# Patient Record
Sex: Female | Born: 1935
Health system: Southern US, Community
[De-identification: ages and names within clinical notes are randomized; demographics above are authoritative.]

## PROBLEM LIST (undated history)

## (undated) DIAGNOSIS — K5730 Diverticulosis of large intestine without perforation or abscess without bleeding: Secondary | ICD-10-CM

## (undated) DIAGNOSIS — T7840XA Allergy, unspecified, initial encounter: Secondary | ICD-10-CM

## (undated) DIAGNOSIS — I639 Cerebral infarction, unspecified: Secondary | ICD-10-CM

## (undated) DIAGNOSIS — E785 Hyperlipidemia, unspecified: Secondary | ICD-10-CM

## (undated) DIAGNOSIS — C801 Malignant (primary) neoplasm, unspecified: Secondary | ICD-10-CM

## (undated) DIAGNOSIS — M8580 Other specified disorders of bone density and structure, unspecified site: Secondary | ICD-10-CM

## (undated) DIAGNOSIS — Z86010 Personal history of colonic polyps: Secondary | ICD-10-CM

## (undated) DIAGNOSIS — K5790 Diverticulosis of intestine, part unspecified, without perforation or abscess without bleeding: Secondary | ICD-10-CM

## (undated) DIAGNOSIS — Z860101 Personal history of adenomatous and serrated colon polyps: Secondary | ICD-10-CM

## (undated) DIAGNOSIS — I10 Essential (primary) hypertension: Secondary | ICD-10-CM

## (undated) DIAGNOSIS — E039 Hypothyroidism, unspecified: Secondary | ICD-10-CM

## (undated) DIAGNOSIS — N950 Postmenopausal bleeding: Secondary | ICD-10-CM

## (undated) DIAGNOSIS — R000 Tachycardia, unspecified: Secondary | ICD-10-CM

## (undated) DIAGNOSIS — K222 Esophageal obstruction: Secondary | ICD-10-CM

## (undated) HISTORY — DX: Allergy, unspecified, initial encounter: T78.40XA

## (undated) HISTORY — DX: Personal history of colonic polyps: Z86.010

## (undated) HISTORY — PX: CATARACT EXTRACTION: SUR2

## (undated) HISTORY — DX: Hypothyroidism, unspecified: E03.9

## (undated) HISTORY — DX: Esophageal obstruction: K22.2

## (undated) HISTORY — PX: DILATION AND CURETTAGE OF UTERUS: SHX78

## (undated) HISTORY — DX: Other specified disorders of bone density and structure, unspecified site: M85.80

## (undated) HISTORY — DX: Tachycardia, unspecified: R00.0

## (undated) HISTORY — DX: Essential (primary) hypertension: I10

## (undated) HISTORY — PX: NASAL SINUS SURGERY: SHX719

## (undated) HISTORY — PX: SHOULDER SURGERY: SHX246

## (undated) HISTORY — PX: HYSTEROSCOPY: SHX211

## (undated) HISTORY — DX: Hyperlipidemia, unspecified: E78.5

## (undated) HISTORY — DX: Personal history of adenomatous and serrated colon polyps: Z86.0101

## (undated) HISTORY — DX: Diverticulosis of large intestine without perforation or abscess without bleeding: K57.30

## (undated) HISTORY — DX: Postmenopausal bleeding: N95.0

## (undated) HISTORY — DX: Diverticulosis of intestine, part unspecified, without perforation or abscess without bleeding: K57.90

## (undated) HISTORY — DX: Malignant (primary) neoplasm, unspecified: C80.1

---

## 1957-07-04 HISTORY — PX: TUBAL LIGATION: SHX77

## 1998-02-24 ENCOUNTER — Other Ambulatory Visit: Admission: RE | Admit: 1998-02-24 | Discharge: 1998-02-24 | Payer: Self-pay | Admitting: Otolaryngology

## 1998-06-17 ENCOUNTER — Other Ambulatory Visit: Admission: RE | Admit: 1998-06-17 | Discharge: 1998-06-17 | Payer: Self-pay | Admitting: *Deleted

## 1999-08-02 ENCOUNTER — Other Ambulatory Visit: Admission: RE | Admit: 1999-08-02 | Discharge: 1999-08-02 | Payer: Self-pay | Admitting: *Deleted

## 1999-08-24 ENCOUNTER — Encounter: Payer: Self-pay | Admitting: *Deleted

## 1999-08-24 ENCOUNTER — Encounter: Admission: RE | Admit: 1999-08-24 | Discharge: 1999-08-24 | Payer: Self-pay | Admitting: *Deleted

## 2000-05-10 ENCOUNTER — Encounter: Payer: Self-pay | Admitting: Internal Medicine

## 2000-06-06 ENCOUNTER — Encounter: Payer: Self-pay | Admitting: Internal Medicine

## 2000-07-18 ENCOUNTER — Other Ambulatory Visit: Admission: RE | Admit: 2000-07-18 | Discharge: 2000-07-18 | Payer: Self-pay | Admitting: *Deleted

## 2000-08-25 ENCOUNTER — Encounter: Admission: RE | Admit: 2000-08-25 | Discharge: 2000-08-25 | Payer: Self-pay | Admitting: *Deleted

## 2000-08-25 ENCOUNTER — Encounter: Payer: Self-pay | Admitting: *Deleted

## 2001-09-11 ENCOUNTER — Other Ambulatory Visit: Admission: RE | Admit: 2001-09-11 | Discharge: 2001-09-11 | Payer: Self-pay | Admitting: *Deleted

## 2001-09-26 ENCOUNTER — Encounter: Admission: RE | Admit: 2001-09-26 | Discharge: 2001-09-26 | Payer: Self-pay | Admitting: *Deleted

## 2001-09-26 ENCOUNTER — Encounter: Payer: Self-pay | Admitting: *Deleted

## 2002-03-18 ENCOUNTER — Encounter: Admission: RE | Admit: 2002-03-18 | Discharge: 2002-03-18 | Payer: Self-pay | Admitting: Internal Medicine

## 2002-03-18 ENCOUNTER — Encounter: Payer: Self-pay | Admitting: Internal Medicine

## 2002-08-26 ENCOUNTER — Other Ambulatory Visit: Admission: RE | Admit: 2002-08-26 | Discharge: 2002-08-26 | Payer: Self-pay | Admitting: *Deleted

## 2002-10-08 ENCOUNTER — Encounter: Payer: Self-pay | Admitting: *Deleted

## 2002-10-08 ENCOUNTER — Encounter: Admission: RE | Admit: 2002-10-08 | Discharge: 2002-10-08 | Payer: Self-pay | Admitting: *Deleted

## 2003-10-15 ENCOUNTER — Other Ambulatory Visit: Admission: RE | Admit: 2003-10-15 | Discharge: 2003-10-15 | Payer: Self-pay | Admitting: *Deleted

## 2003-10-28 ENCOUNTER — Encounter: Admission: RE | Admit: 2003-10-28 | Discharge: 2003-10-28 | Payer: Self-pay | Admitting: *Deleted

## 2003-11-25 ENCOUNTER — Encounter: Payer: Self-pay | Admitting: Internal Medicine

## 2003-11-25 ENCOUNTER — Encounter: Payer: Self-pay | Admitting: Gastroenterology

## 2004-05-19 ENCOUNTER — Ambulatory Visit: Payer: Self-pay | Admitting: Internal Medicine

## 2004-07-21 ENCOUNTER — Ambulatory Visit: Payer: Self-pay | Admitting: Internal Medicine

## 2004-07-29 ENCOUNTER — Ambulatory Visit: Payer: Self-pay | Admitting: Internal Medicine

## 2004-10-25 ENCOUNTER — Other Ambulatory Visit: Admission: RE | Admit: 2004-10-25 | Discharge: 2004-10-25 | Payer: Self-pay | Admitting: *Deleted

## 2004-10-26 ENCOUNTER — Ambulatory Visit: Payer: Self-pay | Admitting: Internal Medicine

## 2004-11-15 ENCOUNTER — Encounter: Admission: RE | Admit: 2004-11-15 | Discharge: 2004-11-15 | Payer: Self-pay | Admitting: *Deleted

## 2005-01-19 ENCOUNTER — Ambulatory Visit: Payer: Self-pay | Admitting: Internal Medicine

## 2005-03-14 ENCOUNTER — Ambulatory Visit: Payer: Self-pay | Admitting: Internal Medicine

## 2005-04-07 ENCOUNTER — Ambulatory Visit: Payer: Self-pay | Admitting: Internal Medicine

## 2005-04-26 ENCOUNTER — Ambulatory Visit: Payer: Self-pay | Admitting: Internal Medicine

## 2005-05-03 ENCOUNTER — Ambulatory Visit: Payer: Self-pay | Admitting: Internal Medicine

## 2005-05-17 ENCOUNTER — Encounter: Payer: Self-pay | Admitting: Internal Medicine

## 2005-05-17 ENCOUNTER — Ambulatory Visit: Payer: Self-pay | Admitting: Family Medicine

## 2005-09-23 ENCOUNTER — Ambulatory Visit: Payer: Self-pay | Admitting: Internal Medicine

## 2005-10-12 ENCOUNTER — Ambulatory Visit: Payer: Self-pay | Admitting: Internal Medicine

## 2005-11-22 ENCOUNTER — Encounter: Admission: RE | Admit: 2005-11-22 | Discharge: 2005-11-22 | Payer: Self-pay | Admitting: *Deleted

## 2006-03-30 ENCOUNTER — Ambulatory Visit: Payer: Self-pay | Admitting: Internal Medicine

## 2006-04-06 ENCOUNTER — Ambulatory Visit: Payer: Self-pay | Admitting: Internal Medicine

## 2006-04-27 ENCOUNTER — Ambulatory Visit: Payer: Self-pay | Admitting: Internal Medicine

## 2006-08-04 LAB — CONVERTED CEMR LAB: Pap Smear: NORMAL

## 2006-09-01 ENCOUNTER — Ambulatory Visit: Payer: Self-pay | Admitting: Internal Medicine

## 2006-10-04 ENCOUNTER — Ambulatory Visit: Payer: Self-pay | Admitting: Internal Medicine

## 2006-10-04 LAB — CONVERTED CEMR LAB
ALT: 25 units/L (ref 0–40)
AST: 27 units/L (ref 0–37)
Albumin: 3.7 g/dL (ref 3.5–5.2)
Alkaline Phosphatase: 74 units/L (ref 39–117)
BUN: 11 mg/dL (ref 6–23)
Bilirubin, Direct: 0.1 mg/dL (ref 0.0–0.3)
CO2: 30 meq/L (ref 19–32)
Calcium: 9.1 mg/dL (ref 8.4–10.5)
Chloride: 109 meq/L (ref 96–112)
Cholesterol: 152 mg/dL (ref 0–200)
Creatinine, Ser: 0.7 mg/dL (ref 0.4–1.2)
GFR calc Af Amer: 103 mL/min
GFR calc non Af Amer: 85 mL/min
Glucose, Bld: 91 mg/dL (ref 70–99)
HDL: 55.5 mg/dL (ref >39.0)
LDL Cholesterol: 83 mg/dL (ref 0–99)
Potassium: 4.5 meq/L (ref 3.5–5.1)
Sodium: 145 meq/L (ref 135–145)
TSH: 0.49 microintl units/mL (ref 0.35–5.50)
Total Bilirubin: 0.7 mg/dL (ref 0.3–1.2)
Total CHOL/HDL Ratio: 2.7
Total Protein: 6.6 g/dL (ref 6.0–8.3)
Triglycerides: 68 mg/dL (ref 0–149)
VLDL: 14 mg/dL (ref 0–40)

## 2006-11-24 ENCOUNTER — Encounter: Admission: RE | Admit: 2006-11-24 | Discharge: 2006-11-24 | Payer: Self-pay | Admitting: *Deleted

## 2006-11-30 ENCOUNTER — Encounter: Admission: RE | Admit: 2006-11-30 | Discharge: 2006-11-30 | Payer: Self-pay | Admitting: *Deleted

## 2006-12-11 ENCOUNTER — Other Ambulatory Visit: Admission: RE | Admit: 2006-12-11 | Discharge: 2006-12-11 | Payer: Self-pay | Admitting: *Deleted

## 2007-01-16 DIAGNOSIS — I10 Essential (primary) hypertension: Secondary | ICD-10-CM | POA: Insufficient documentation

## 2007-01-16 DIAGNOSIS — E039 Hypothyroidism, unspecified: Secondary | ICD-10-CM | POA: Insufficient documentation

## 2007-01-16 DIAGNOSIS — J45909 Unspecified asthma, uncomplicated: Secondary | ICD-10-CM | POA: Insufficient documentation

## 2007-01-16 DIAGNOSIS — Z85828 Personal history of other malignant neoplasm of skin: Secondary | ICD-10-CM

## 2007-02-16 ENCOUNTER — Ambulatory Visit: Payer: Self-pay | Admitting: Internal Medicine

## 2007-02-21 ENCOUNTER — Ambulatory Visit: Payer: Self-pay | Admitting: Internal Medicine

## 2007-02-22 ENCOUNTER — Ambulatory Visit: Payer: Self-pay | Admitting: Internal Medicine

## 2007-03-12 ENCOUNTER — Ambulatory Visit: Payer: Self-pay | Admitting: Internal Medicine

## 2007-03-29 ENCOUNTER — Encounter: Payer: Self-pay | Admitting: Internal Medicine

## 2007-04-05 ENCOUNTER — Ambulatory Visit: Payer: Self-pay | Admitting: Pulmonary Disease

## 2007-04-05 ENCOUNTER — Encounter: Payer: Self-pay | Admitting: Internal Medicine

## 2007-04-11 ENCOUNTER — Ambulatory Visit: Payer: Self-pay | Admitting: Internal Medicine

## 2007-04-11 DIAGNOSIS — E785 Hyperlipidemia, unspecified: Secondary | ICD-10-CM

## 2007-04-12 LAB — CONVERTED CEMR LAB
ALT: 22 units/L (ref 0–35)
AST: 27 units/L (ref 0–37)
Albumin: 4 g/dL (ref 3.5–5.2)
Alkaline Phosphatase: 64 units/L (ref 39–117)
BUN: 9 mg/dL (ref 6–23)
Bilirubin, Direct: 0.2 mg/dL (ref 0.0–0.3)
CO2: 32 meq/L (ref 19–32)
Calcium: 9.7 mg/dL (ref 8.4–10.5)
Chloride: 111 meq/L (ref 96–112)
Cholesterol: 171 mg/dL (ref 0–200)
Creatinine, Ser: 0.8 mg/dL (ref 0.4–1.2)
GFR calc Af Amer: 88 mL/min
GFR calc non Af Amer: 73 mL/min
Glucose, Bld: 98 mg/dL (ref 70–99)
HDL: 54.5 mg/dL (ref >39.0)
LDL Cholesterol: 89 mg/dL (ref 0–99)
Potassium: 4.9 meq/L (ref 3.5–5.1)
Sodium: 148 meq/L — ABNORMAL HIGH (ref 135–145)
TSH: 0.9 microintl units/mL (ref 0.35–5.50)
Total Bilirubin: 0.7 mg/dL (ref 0.3–1.2)
Total CHOL/HDL Ratio: 3.1
Total Protein: 6.6 g/dL (ref 6.0–8.3)
Triglycerides: 139 mg/dL (ref 0–149)
VLDL: 28 mg/dL (ref 0–40)

## 2007-04-23 ENCOUNTER — Ambulatory Visit: Payer: Self-pay | Admitting: Internal Medicine

## 2007-04-23 DIAGNOSIS — I447 Left bundle-branch block, unspecified: Secondary | ICD-10-CM | POA: Insufficient documentation

## 2007-04-26 ENCOUNTER — Encounter: Payer: Self-pay | Admitting: Internal Medicine

## 2007-04-26 ENCOUNTER — Ambulatory Visit: Payer: Self-pay

## 2007-05-16 ENCOUNTER — Encounter: Payer: Self-pay | Admitting: Internal Medicine

## 2007-07-10 ENCOUNTER — Ambulatory Visit: Payer: Self-pay | Admitting: Internal Medicine

## 2007-10-09 ENCOUNTER — Ambulatory Visit: Payer: Self-pay | Admitting: Internal Medicine

## 2007-10-10 LAB — CONVERTED CEMR LAB
ALT: 22 units/L (ref 0–35)
Bilirubin, Direct: 0.2 mg/dL (ref 0.0–0.3)
CO2: 30 meq/L (ref 19–32)
Calcium: 9.2 mg/dL (ref 8.4–10.5)
GFR calc Af Amer: 88 mL/min
GFR calc non Af Amer: 73 mL/min
HDL: 49.2 mg/dL (ref >39.0)
LDL Cholesterol: 73 mg/dL (ref 0–99)
Sodium: 144 meq/L (ref 135–145)
Total Bilirubin: 0.6 mg/dL (ref 0.3–1.2)
Total CHOL/HDL Ratio: 2.9

## 2007-12-14 ENCOUNTER — Other Ambulatory Visit: Admission: RE | Admit: 2007-12-14 | Discharge: 2007-12-14 | Payer: Self-pay | Admitting: Obstetrics and Gynecology

## 2007-12-17 ENCOUNTER — Telehealth (INDEPENDENT_AMBULATORY_CARE_PROVIDER_SITE_OTHER): Payer: Self-pay | Admitting: *Deleted

## 2007-12-24 ENCOUNTER — Encounter: Payer: Self-pay | Admitting: Internal Medicine

## 2008-01-09 ENCOUNTER — Ambulatory Visit: Payer: Self-pay | Admitting: Internal Medicine

## 2008-01-11 ENCOUNTER — Ambulatory Visit: Payer: Self-pay | Admitting: Gastroenterology

## 2008-01-11 DIAGNOSIS — Z860100 Personal history of colon polyps, unspecified: Secondary | ICD-10-CM | POA: Insufficient documentation

## 2008-01-11 DIAGNOSIS — K5730 Diverticulosis of large intestine without perforation or abscess without bleeding: Secondary | ICD-10-CM

## 2008-01-11 HISTORY — DX: Diverticulosis of large intestine without perforation or abscess without bleeding: K57.30

## 2008-01-11 HISTORY — DX: Personal history of colonic polyps: Z86.010

## 2008-01-11 HISTORY — DX: Personal history of colon polyps, unspecified: Z86.0100

## 2008-01-11 LAB — CONVERTED CEMR LAB
Eosinophils Relative: 2 % (ref 0.0–5.0)
HCT: 45.1 % (ref 36.0–46.0)
Hemoglobin: 15.3 g/dL — ABNORMAL HIGH (ref 12.0–15.0)
Lymphocytes Relative: 35.8 % (ref 12.0–46.0)
Monocytes Absolute: 0.9 10*3/uL (ref 0.1–1.0)
Monocytes Relative: 10.6 % (ref 3.0–12.0)
Neutro Abs: 4.1 10*3/uL (ref 1.4–7.7)
RBC: 5.05 M/uL (ref 3.87–5.11)
TSH: 0.8 microintl units/mL (ref 0.35–5.50)
WBC: 8.2 10*3/uL (ref 4.5–10.5)

## 2008-01-14 ENCOUNTER — Encounter: Payer: Self-pay | Admitting: Gastroenterology

## 2008-01-14 ENCOUNTER — Ambulatory Visit: Payer: Self-pay | Admitting: Gastroenterology

## 2008-01-16 ENCOUNTER — Encounter: Payer: Self-pay | Admitting: Gastroenterology

## 2008-01-28 ENCOUNTER — Ambulatory Visit: Payer: Self-pay | Admitting: Internal Medicine

## 2008-02-01 ENCOUNTER — Ambulatory Visit: Payer: Self-pay | Admitting: Gastroenterology

## 2008-02-01 LAB — CONVERTED CEMR LAB
Fecal Occult Blood: NEGATIVE
OCCULT 4: NEGATIVE
OCCULT 5: NEGATIVE

## 2008-02-04 ENCOUNTER — Encounter: Payer: Self-pay | Admitting: Gastroenterology

## 2008-02-25 ENCOUNTER — Ambulatory Visit: Payer: Self-pay | Admitting: Internal Medicine

## 2008-02-25 ENCOUNTER — Telehealth: Payer: Self-pay | Admitting: Internal Medicine

## 2008-02-27 ENCOUNTER — Telehealth: Payer: Self-pay | Admitting: Internal Medicine

## 2008-03-28 ENCOUNTER — Ambulatory Visit: Payer: Self-pay | Admitting: Internal Medicine

## 2008-03-28 ENCOUNTER — Telehealth: Payer: Self-pay | Admitting: Internal Medicine

## 2008-06-05 ENCOUNTER — Ambulatory Visit: Payer: Self-pay | Admitting: Internal Medicine

## 2008-06-05 LAB — CONVERTED CEMR LAB
ALT: 21 units/L (ref 0–35)
AST: 20 units/L (ref 0–37)
Bilirubin, Direct: 0.1 mg/dL (ref 0.0–0.3)
CO2: 30 meq/L (ref 19–32)
Calcium: 9.1 mg/dL (ref 8.4–10.5)
Chloride: 109 meq/L (ref 96–112)
Creatinine, Ser: 0.8 mg/dL (ref 0.4–1.2)
Glucose, Bld: 93 mg/dL (ref 70–99)
HDL: 48.2 mg/dL (ref >39.0)
Sodium: 144 meq/L (ref 135–145)
Total Bilirubin: 0.8 mg/dL (ref 0.3–1.2)
Total Protein: 6.6 g/dL (ref 6.0–8.3)
Triglycerides: 123 mg/dL (ref 0–149)

## 2008-06-18 ENCOUNTER — Ambulatory Visit: Payer: Self-pay | Admitting: Internal Medicine

## 2008-07-10 ENCOUNTER — Ambulatory Visit: Payer: Self-pay | Admitting: Internal Medicine

## 2008-07-21 ENCOUNTER — Telehealth: Payer: Self-pay | Admitting: Internal Medicine

## 2008-08-26 ENCOUNTER — Telehealth: Payer: Self-pay | Admitting: Internal Medicine

## 2008-08-27 ENCOUNTER — Encounter: Payer: Self-pay | Admitting: Internal Medicine

## 2008-09-17 ENCOUNTER — Ambulatory Visit: Payer: Self-pay | Admitting: Internal Medicine

## 2008-11-17 ENCOUNTER — Telehealth (INDEPENDENT_AMBULATORY_CARE_PROVIDER_SITE_OTHER): Payer: Self-pay | Admitting: *Deleted

## 2008-12-04 ENCOUNTER — Encounter: Payer: Self-pay | Admitting: Internal Medicine

## 2008-12-15 ENCOUNTER — Other Ambulatory Visit: Admission: RE | Admit: 2008-12-15 | Discharge: 2008-12-15 | Payer: Self-pay | Admitting: Obstetrics and Gynecology

## 2008-12-15 ENCOUNTER — Encounter: Payer: Self-pay | Admitting: Obstetrics and Gynecology

## 2008-12-15 ENCOUNTER — Ambulatory Visit: Payer: Self-pay | Admitting: Obstetrics and Gynecology

## 2009-01-23 ENCOUNTER — Ambulatory Visit: Payer: Self-pay | Admitting: Internal Medicine

## 2009-01-23 LAB — CONVERTED CEMR LAB
Alkaline Phosphatase: 72 units/L (ref 39–117)
BUN: 9 mg/dL (ref 6–23)
Bilirubin, Direct: 0 mg/dL (ref 0.0–0.3)
CO2: 28 meq/L (ref 19–32)
Chloride: 107 meq/L (ref 96–112)
Cholesterol: 141 mg/dL (ref 0–200)
Creatinine, Ser: 0.8 mg/dL (ref 0.4–1.2)
Glucose, Bld: 100 mg/dL — ABNORMAL HIGH (ref 70–99)
LDL Cholesterol: 68 mg/dL (ref 0–99)
Potassium: 4.2 meq/L (ref 3.5–5.1)
Total Bilirubin: 0.8 mg/dL (ref 0.3–1.2)
Total Protein: 6.8 g/dL (ref 6.0–8.3)
VLDL: 29 mg/dL (ref 0.0–40.0)

## 2009-02-09 ENCOUNTER — Ambulatory Visit: Payer: Self-pay | Admitting: Internal Medicine

## 2009-04-03 ENCOUNTER — Ambulatory Visit: Payer: Self-pay | Admitting: Internal Medicine

## 2009-05-15 ENCOUNTER — Telehealth: Payer: Self-pay | Admitting: Internal Medicine

## 2009-05-22 ENCOUNTER — Ambulatory Visit: Payer: Self-pay | Admitting: Family Medicine

## 2009-08-10 ENCOUNTER — Ambulatory Visit: Payer: Self-pay | Admitting: Internal Medicine

## 2009-08-10 LAB — CONVERTED CEMR LAB
ALT: 20 units/L (ref 0–35)
Bilirubin, Direct: 0.1 mg/dL (ref 0.0–0.3)
CO2: 30 meq/L (ref 19–32)
Calcium: 9 mg/dL (ref 8.4–10.5)
Glucose, Bld: 86 mg/dL (ref 70–99)
HDL: 53 mg/dL (ref >39.00)
LDL Cholesterol: 58 mg/dL (ref 0–99)
Potassium: 4 meq/L (ref 3.5–5.1)
Sodium: 141 meq/L (ref 135–145)
TSH: 0.81 microintl units/mL (ref 0.35–5.50)
Total Bilirubin: 0.5 mg/dL (ref 0.3–1.2)
Total CHOL/HDL Ratio: 3
VLDL: 26.4 mg/dL (ref 0.0–40.0)

## 2009-08-28 ENCOUNTER — Ambulatory Visit: Payer: Self-pay | Admitting: Internal Medicine

## 2009-11-19 ENCOUNTER — Telehealth: Payer: Self-pay | Admitting: Internal Medicine

## 2009-11-27 ENCOUNTER — Ambulatory Visit: Payer: Self-pay | Admitting: Internal Medicine

## 2009-11-27 DIAGNOSIS — J309 Allergic rhinitis, unspecified: Secondary | ICD-10-CM | POA: Insufficient documentation

## 2009-12-07 ENCOUNTER — Encounter: Payer: Self-pay | Admitting: Internal Medicine

## 2009-12-24 ENCOUNTER — Ambulatory Visit: Payer: Self-pay | Admitting: Internal Medicine

## 2010-03-17 ENCOUNTER — Ambulatory Visit: Payer: Self-pay | Admitting: Internal Medicine

## 2010-03-17 LAB — CONVERTED CEMR LAB
Albumin: 4.1 g/dL (ref 3.5–5.2)
Bilirubin, Direct: 0.1 mg/dL (ref 0.0–0.3)
CO2: 28 meq/L (ref 19–32)
Calcium: 9.5 mg/dL (ref 8.4–10.5)
Creatinine, Ser: 0.7 mg/dL (ref 0.4–1.2)
GFR calc non Af Amer: 80.54 mL/min (ref >60)
Glucose, Bld: 80 mg/dL (ref 70–99)
HDL: 52 mg/dL (ref >39.00)
Total CHOL/HDL Ratio: 3
Total Protein: 6.4 g/dL (ref 6.0–8.3)
Triglycerides: 91 mg/dL (ref 0.0–149.0)
VLDL: 18.2 mg/dL (ref 0.0–40.0)

## 2010-03-19 ENCOUNTER — Ambulatory Visit: Payer: Self-pay | Admitting: Obstetrics and Gynecology

## 2010-04-02 ENCOUNTER — Ambulatory Visit: Payer: Self-pay | Admitting: Internal Medicine

## 2010-07-25 ENCOUNTER — Encounter: Payer: Self-pay | Admitting: *Deleted

## 2010-08-05 NOTE — Progress Notes (Signed)
  Phone Note Call from Patient   Caller: Patient Call For: Robin Sons MD Summary of Call: Needs a partial refill for Metoprolol 50 mg. one by mouth daily. Nicolette Bang ([place]) Initial call taken by: Lynann Beaver CMA,  Nov 19, 2009 4:45 PM    Prescriptions: METOPROLOL TARTRATE 50 MG TABS (METOPROLOL TARTRATE) Take 1 tablet by mouth two times a day  #10 x 0   Entered by:   Lynann Beaver CMA   Authorized by:   Robin Sons MD   Signed by:   Lynann Beaver CMA on 11/19/2009   Method used:   Electronically to        [facility]  [phone]* (retail)       [address]       [place], [place]  [address]       Ph: [phone] or [phone]       Fax: [phone]   RxID:   [id number]

## 2010-08-05 NOTE — Assessment & Plan Note (Signed)
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  History of Present Illness: Rhinits---much imporved  hypothyroid---tolerating meds  HTn---tolerating meds without difficulty  All other systems reviewed and were negative   Preventive Screening-Counseling & Management  Alcohol-Tobacco     Smoking Status: quit  Current Problems (verified): 1)  Rhinitis  (ICD-477.9) 2)  Diverticulosis of Colon  (ICD-562.10) 3)  Personal History of Colonic Polyps  (ICD-V12.72) 4)  Left Bundle Branch Block  (ICD-426.3) 5)  Hyperlipidemia  (ICD-272.4) 6)  Hypothyroidism  (ICD-244.9) 7)  Hypertension  (ICD-401.9) 8)  Skin Cancer, Hx of  (ICD-V10.83) 9)  Asthma  (ICD-493.90)  Current Medications (verified): 1)  Crestor 20 Mg Tabs (Rosuvastatin Calcium) .... 1/2 Every Other Day 2)  Metoprolol Tartrate 50 Mg Tabs (Metoprolol Tartrate) .... Take 1 Tablet By Mouth Two Times A Day 3)  Levothyroxine Sodium 100 Mcg  Tabs (Levothyroxine Sodium) .Marland Kitchen.. 1 By Mouth Daily 4)  Adult Aspirin Low Strength 81 Mg  Tbdp (Aspirin) .... Three Times Weekly 5)  Multivitamins   Tabs (Multiple Vitamin) .... Qd 6)  Tums 500 Mg  Chew (Calcium Carbonate Antacid) .... 3 Qd 7)  Cvs Saline Nasal Spray 0.65 %  Soln (Saline) .... Once Daily 8)  Triamcinolone Acetonide 0.1 %  Crea (Triamcinolone Acetonide) .... Use As Directed 9)  Lisinopril 20 Mg Tabs (Lisinopril) .Marland Kitchen.. 1 Tablet By Mouth Daily 10)  Aciphex 20 Mg Tbec  (Rabeprazole Sodium) .... Take 1 Tab Every Morning 11)  Glucosamine Sulfate 500 Mg Tabs (Glucosamine Sulfate) .... Take Two Times A Day 12)  Vitamin D 1000 Unit Tabs (Cholecalciferol) .... Once Daily 13)  Mucinex Dm 30-600 Mg Xr12h-Tab (Dextromethorphan-Guaifenesin) .... Take 1 Tablet By Mouth Two Times A Day 14)  Fluticasone Propionate 50 Mcg/act  Susp (Fluticasone Propionate) .... 2 Sprays Each Nostril Once Daily 15)  Fluorouracil 5 % Crea (Fluorouracil) .... Use Twice Daily As Directed  Allergies: 1)  ! * Novacaine  Physical Exam  General:  alert and well-developed.   Head:  normocephalic and atraumatic.   Eyes:  pupils equal and pupils round.   Ears:  R ear normal and L ear normal.   Neck:  No deformities, masses, or tenderness noted. Lungs:  Normal respiratory effort, chest expands symmetrically. Lungs are clear to auscultation, no crackles or wheezes. Heart:  normal rate and regular rhythm.   Abdomen:  Bowel sounds positive,abdomen soft and non-tender without masses, organomegaly or hernias noted.  overweight Neurologic:  cranial nerves II-XII intact and gait normal.     Impression & Recommendations:  Problem # 1:  RHINITIS (ICD-477.9) much improved Her updated medication list for this problem includes:    Cvs Saline Nasal Spray 0.65 % Soln (Saline) ..... Once daily    Fluticasone Propionate 50 Mcg/act Susp (Fluticasone  propionate) .Marland Kitchen... 2 sprays each nostril once daily  Problem # 2:  HYPOTHYROIDISM (ICD-244.9) contolled in February Her updated medication list for this problem includes:    Levothyroxine Sodium 100 Mcg Tabs (Levothyroxine sodium) .Marland Kitchen... 1 by mouth daily  Labs Reviewed: TSH: 0.81 (08/10/2009)    Chol: 137 (08/10/2009)   HDL: 53.00 (08/10/2009)   LDL: 58 (08/10/2009)   TG: 132.0 (08/10/2009)  Problem # 3:  HYPERLIPIDEMIA (ICD-272.4) controlled previously continue current medications  Her updated medication list for this problem includes:    Crestor 20  Mg Tabs (Rosuvastatin calcium) .Marland Kitchen... 1/2 every other day  Labs Reviewed: SGOT: 24 (08/10/2009)   SGPT: 20 (08/10/2009)   HDL:53.00 (08/10/2009), 44.20 (01/23/2009)  LDL:58 (08/10/2009), 68 (01/23/2009)  Chol:137 (08/10/2009), 141 (01/23/2009)  Trig:132.0 (08/10/2009), 145.0 (01/23/2009)  Complete Medication List: 1)  Crestor 20 Mg Tabs (Rosuvastatin calcium) .... 1/2 every other day 2)  Metoprolol Tartrate 50 Mg Tabs (Metoprolol tartrate) .... Take 1 tablet by mouth two times a day 3)  Levothyroxine Sodium 100 Mcg Tabs (Levothyroxine sodium) .Marland Kitchen.. 1 by mouth daily 4)  Adult Aspirin Low Strength 81 Mg Tbdp (Aspirin) .... Three times weekly 5)  Multivitamins Tabs (Multiple vitamin) .... Qd 6)  Tums 500 Mg Chew (Calcium carbonate antacid) .... 3 qd 7)  Cvs Saline Nasal Spray 0.65 % Soln (Saline) .... Once daily 8)  Triamcinolone Acetonide 0.1 % Crea (Triamcinolone acetonide) .... Use as directed 9)  Lisinopril 20 Mg Tabs (Lisinopril) .Marland Kitchen.. 1 tablet by mouth daily 10)  Aciphex 20 Mg Tbec (Rabeprazole sodium) .... Take 1 tab every morning 11)  Glucosamine Sulfate 500 Mg Tabs (Glucosamine sulfate) .... Take two times a day 12)  Vitamin D 1000 Unit Tabs (Cholecalciferol) .... Once daily 13)  Mucinex Dm 30-600 Mg Xr12h-tab (Dextromethorphan-guaifenesin) .... Take 1 tablet by mouth two times a day 14)  Fluticasone Propionate 50 Mcg/act Susp (Fluticasone propionate) .... 2 sprays each nostril once daily 15)  Fluorouracil 5 % Crea (Fluorouracil) .... Use twice daily as directed Prescriptions: LEVOTHYROXINE SODIUM 100 MCG  TABS (LEVOTHYROXINE SODIUM) 1 by mouth daily  #90 x 3   Entered and Authorized by:   Birdie Sons MD   Signed by:   Birdie Sons MD on 12/24/2009   Method used:   Print then Give to Patient   RxID:   [id number]

## 2010-08-05 NOTE — Miscellaneous (Signed)
Summary: flu vaccine   Clinical Lists Changes  Orders: Added new Service order of Flu Vaccine 22yrs + MEDICARE PATIENTS (W4132) - Signed Added new Service order of Administration Flu vaccine - MCR (G4010) - Signed Observations: Added new observation of FLU VAX VIS: 01/26/2010 version (03/17/2010 9:09) Added new observation of FLU VAXLOT: [id number] (03/17/2010 9:09) Added new observation of FLU VAXMFR: [employer] (03/17/2010 9:09) Added new observation of FLU VAX EXP: 01/01/2011 (03/17/2010 9:09) Added new observation of FLU VAX DSE: 0.64ml (03/17/2010 9:09) Added new observation of FLU VAX: Fluvax 3+ (03/17/2010 9:09)Flu Vaccine Consent Questions     Do you have a history of severe allergic reactions to this vaccine? no    Any prior history of allergic reactions to egg and/or gelatin? no    Do you have a sensitivity to the preservative Thimersol? no    Do you have a past history of Guillan-Barre Syndrome? no    Do you currently have an acute febrile illness? no    Have you ever had a severe reaction to latex? no    Vaccine information given and explained to patient? yes    Are you currently pregnant? no    Lot Number:[id number]   Exp Date:01/01/2011   Site Given  Left Deltoid IMbservation of FLU VAX EXP: 01/01/2011 (03/17/2010 9:09) Added new observation of FLU VAX DSE: 0.29ml (03/17/2010 9:09) Added new observation of FLU VAX: Fluvax 3+ (03/17/2010 9:09)    .lbmedflu

## 2010-08-05 NOTE — Assessment & Plan Note (Signed)
Summary: UP/MHF/PT RSC/CJR   Vital Signs:  Patient profile:   77 year old female Weight:      172 pounds Temp:     98.7 degrees F oral BP sitting:   148 / 80  (left arm) Cuff size:   regular  Vitals Entered By: Kern Reap CMA Duncan Dull) (August 28, 2009 9:44 AM)  Serial Vital Signs/Assessments:  Time      Position  BP       Pulse  Resp  Temp     By                     138/75                         Birdie Sons MD  CC: 6 month follow up Is Patient Diabetic? No Pain Assessment Patient in pain? no        Primary Care Provider:  Birdie Sons MD  CC:  6 month follow up.  History of Present Illness:  Follow-Up Visit      This is an 77 year old woman who presents for Follow-up visit.  The patient denies chest pain and palpitations.  Since the last visit the patient notes no new problems or concerns and being seen by a specialist.  The patient reports taking meds as prescribed.  When questioned about possible medication side effects, the patient notes none.   has had some squamous cell skin surgeries---dr. Wakisha Filbert  All other systems reviewed and were negative   Current Problems (verified): 1)  Diverticulosis of Colon  (ICD-562.10) 2)  Personal History of Colonic Polyps  (ICD-V12.72) 3)  Left Bundle Branch Block  (ICD-426.3) 4)  Hyperlipidemia  (ICD-272.4) 5)  Hypothyroidism  (ICD-244.9) 6)  Hypertension  (ICD-401.9) 7)  Skin Cancer, Hx of  (ICD-V10.83) 8)  Asthma  (ICD-493.90)  Current Medications (verified): 1)  Crestor 20 Mg Tabs (Rosuvastatin Calcium) .... 1/2 Every Other Day 2)  Metoprolol Tartrate 50 Mg Tabs (Metoprolol Tartrate) .... Take 1 Tablet By Mouth Two Times A Day 3)  Levothyroxine Sodium 100 Mcg  Tabs (Levothyroxine Sodium) .Marland Kitchen.. 1 By Mouth Daily 4)  Adult Aspirin Low Strength 81 Mg  Tbdp (Aspirin) .... Three Times Weekly 5)  Multivitamins   Tabs (Multiple Vitamin) .... Qd 6)  Tums 500 Mg  Chew (Calcium Carbonate Antacid) .... 3 Qd 7)  Cvs  Saline Nasal Spray 0.65 %  Soln (Saline) .... Once Daily 8)  Triamcinolone Acetonide 0.1 %  Crea (Triamcinolone Acetonide) .... Use As Directed 9)  Lisinopril 20 Mg Tabs (Lisinopril) .... 1/2 Tablet By Mouth Daily 10)  Aciphex 20 Mg Tbec (Rabeprazole Sodium) .... Take 1 Tab Every Morning 11)  Glucosamine Sulfate 500 Mg Tabs (Glucosamine Sulfate) .... Take Two Times A Day 12)  Vitamin D 1000 Unit Tabs (Cholecalciferol) .... Once Daily  Allergies (verified): 1)  ! * Novacaine  Past History:  Past Medical History: Last updated: 01/10/2008 Allergic rhinitis Asthma Skin cancer, hx of-Rt shoulder Hypertension Hypothyroidism Hyperlipidemia Adenomatous Colon Polyps Diverticulosis Esophageal Stricture Hemorrhoids  Past Surgical History: Last updated: 01/10/2008 Cataract extraction Sinus surgery Shoulder sx Colonoscopy-11/25/2003 Tubal Ligation- 1959  Family History: Last updated: 01/10/2008 Family History of Heart Disease: Brother Bone cancer: Mother  Social History: Last updated: 04/11/2007 Former Smoker Regular exercise-no Retired  Risk Factors: Exercise: no (04/11/2007)  Risk Factors: Smoking Status: quit (01/16/2007)  Review of Systems       All other systems  reviewed and were negative   Physical Exam  General:  Well-developed,well-nourished,in no acute distress; alert,appropriate and cooperative throughout examination Head:  normocephalic and atraumatic.   Eyes:  pupils equal and pupils round.   Ears:  R ear normal and L ear normal.   Neck:  No deformities, masses, or tenderness noted. Chest Wall:  No deformities, masses, or tenderness noted. Lungs:  Normal respiratory effort, chest expands symmetrically. Lungs are clear to auscultation, no crackles or wheezes. Heart:  Normal rate and regular rhythm. S1 and S2 normal without gallop, murmur, click, rub or other extra sounds. Abdomen:  Bowel sounds positive,abdomen soft and non-tender without masses,  organomegaly or hernias noted.  overweight Msk:  No deformity or scoliosis noted of thoracic or lumbar spine.   Neurologic:  cranial nerves II-XII intact and gait normal.   Skin:  turgor normal and color normal.   Psych:  memory intact for recent and remote and good eye contact.     Impression & Recommendations:  Problem # 1:  HYPERLIPIDEMIA (ICD-272.4) controlled continue current medications  Her updated medication list for this problem includes:    Crestor 20 Mg Tabs (Rosuvastatin calcium) .Marland Kitchen... 1/2 every other day  Labs Reviewed: SGOT: 24 (08/10/2009)   SGPT: 20 (08/10/2009)   HDL:53.00 (08/10/2009), 44.20 (01/23/2009)  LDL:58 (08/10/2009), 68 (01/23/2009)  Chol:137 (08/10/2009), 141 (01/23/2009)  Trig:132.0 (08/10/2009), 145.0 (01/23/2009)  Problem # 2:  HYPOTHYROIDISM (ICD-244.9) controlled continue current medications  Her updated medication list for this problem includes:    Levothyroxine Sodium 100 Mcg Tabs (Levothyroxine sodium) .Marland Kitchen... 1 by mouth daily  Labs Reviewed: TSH: 0.81 (08/10/2009)    Chol: 137 (08/10/2009)   HDL: 53.00 (08/10/2009)   LDL: 58 (08/10/2009)   TG: 132.0 (08/10/2009)  Problem # 3:  HYPERTENSION (ICD-401.9) increase lisinopril Her updated medication list for this problem includes:    Metoprolol Tartrate 50 Mg Tabs (Metoprolol tartrate) .Marland Kitchen... Take 1 tablet by mouth two times a day    Lisinopril 20 Mg Tabs (Lisinopril) .Marland Kitchen... 1 tablet by mouth daily  BP today: 148/80 Prior BP: 164/108 (05/22/2009)  Labs Reviewed: K+: 4.0 (08/10/2009) Creat: : 0.8 (08/10/2009)   Chol: 137 (08/10/2009)   HDL: 53.00 (08/10/2009)   LDL: 58 (08/10/2009)   TG: 132.0 (08/10/2009)  Problem # 4:  SKIN CANCER, HX OF (ICD-V10.83) ---followed by dermatology  Complete Medication List: 1)  Crestor 20 Mg Tabs (Rosuvastatin calcium) .... 1/2 every other day 2)  Metoprolol Tartrate 50 Mg Tabs (Metoprolol tartrate) .... Take 1 tablet by mouth two times a day 3)  Levothyroxine  Sodium 100 Mcg Tabs (Levothyroxine sodium) .Marland Kitchen.. 1 by mouth daily 4)  Adult Aspirin Low Strength 81 Mg Tbdp (Aspirin) .... Three times weekly 5)  Multivitamins Tabs (Multiple vitamin) .... Qd 6)  Tums 500 Mg Chew (Calcium carbonate antacid) .... 3 qd 7)  Cvs Saline Nasal Spray 0.65 % Soln (Saline) .... Once daily 8)  Triamcinolone Acetonide 0.1 % Crea (Triamcinolone acetonide) .... Use as directed 9)  Lisinopril 20 Mg Tabs (Lisinopril) .Marland Kitchen.. 1 tablet by mouth daily 10)  Aciphex 20 Mg Tbec (Rabeprazole sodium) .... Take 1 tab every morning 11)  Glucosamine Sulfate 500 Mg Tabs (Glucosamine sulfate) .... Take two times a day 12)  Vitamin D 1000 Unit Tabs (Cholecalciferol) .... Once daily  Patient Instructions: 1)  Please schedule a follow-up appointment in 6 months. 2)  lipids 272.4 3)  liver 995.2 4)   bmet--995.2 Prescriptions: LISINOPRIL 20 MG TABS (LISINOPRIL) 1 tablet by mouth  daily  #90 x 3   Entered and Authorized by:   Birdie Sons MD   Signed by:   Birdie Sons MD on 08/28/2009   Method used:   Electronically to        [facility]  [phone]* (retail)       [address]       [place], [place]  [address]       Ph: [phone] or [phone]       Fax: [phone]   RxID:   [id number]

## 2010-08-05 NOTE — Assessment & Plan Note (Signed)
Summary: sinuses?//ccm   Vital Signs:  Patient profile:   77 year old female Weight:      171 pounds BMI:     32.43 Temp:     98.6 degrees F oral Pulse rate:   68 / minute Pulse rhythm:   regular Resp:     12 per minute BP sitting:   156 / 90  (left arm) Cuff size:   regular  Vitals Entered By: Gladis Riffle, RN (Nov 27, 2009 10:38 AM) CC: c/o sinus drainage and cough, also vertigo--getting better but continues Is Patient Diabetic? No Comments not sure about how she is taking lisinopril, has written 1/2 on old check out sheet--told to do one once daily as written   Primary Care Provider:  Birdie Sons MD  CC:  c/o sinus drainage and cough and also vertigo--getting better but continues.  History of Present Illness: has had long term sinus congestion for 3 months---improving but not gone (reason for visit today) no cough occasionally has vertigo no fever or chills  with above sxs she does have some fatigue  All other systems reviewed and were negative   Preventive Screening-Counseling & Management  Alcohol-Tobacco     Smoking Status: quit  Current Problems (verified): 1)  Diverticulosis of Colon  (ICD-562.10) 2)  Personal History of Colonic Polyps  (ICD-V12.72) 3)  Left Bundle Branch Block  (ICD-426.3) 4)  Hyperlipidemia  (ICD-272.4) 5)  Hypothyroidism  (ICD-244.9) 6)  Hypertension  (ICD-401.9) 7)  Skin Cancer, Hx of  (ICD-V10.83) 8)  Asthma  (ICD-493.90)  Current Medications (verified): 1)  Crestor 20 Mg Tabs (Rosuvastatin Calcium) .... 1/2 Every Other Day 2)  Metoprolol Tartrate 50 Mg Tabs (Metoprolol Tartrate) .... Take 1 Tablet By Mouth Two Times A Day 3)  Levothyroxine Sodium 100 Mcg  Tabs (Levothyroxine Sodium) .Marland Kitchen.. 1 By Mouth Daily 4)  Adult Aspirin Low Strength 81 Mg  Tbdp (Aspirin) .... Three Times Weekly 5)  Multivitamins   Tabs (Multiple Vitamin) .... Qd 6)  Tums 500 Mg  Chew (Calcium Carbonate Antacid) .... 3 Qd 7)  Cvs Saline Nasal Spray 0.65 %  Soln  (Saline) .... Once Daily 8)  Triamcinolone Acetonide 0.1 %  Crea (Triamcinolone Acetonide) .... Use As Directed 9)  Lisinopril 20 Mg Tabs (Lisinopril) .Marland Kitchen.. 1 Tablet By Mouth Daily 10)  Aciphex 20 Mg Tbec (Rabeprazole Sodium) .... Take 1 Tab Every Morning 11)  Glucosamine Sulfate 500 Mg Tabs (Glucosamine Sulfate) .... Take Two Times A Day 12)  Vitamin D 1000 Unit Tabs (Cholecalciferol) .... Once Daily  Allergies: 1)  ! * Novacaine  Past History:  Past Medical History: Last updated: 01/10/2008 Allergic rhinitis Asthma Skin cancer, hx of-Rt shoulder Hypertension Hypothyroidism Hyperlipidemia Adenomatous Colon Polyps Diverticulosis Esophageal Stricture Hemorrhoids  Past Surgical History: Last updated: 01/10/2008 Cataract extraction Sinus surgery Shoulder sx Colonoscopy-11/25/2003 Tubal Ligation- 1959  Family History: Last updated: 01/10/2008 Family History of Heart Disease: Brother Bone cancer: Mother  Social History: Last updated: 04/11/2007 Former Smoker Regular exercise-no Retired  Risk Factors: Exercise: no (04/11/2007)  Risk Factors: Smoking Status: quit (11/27/2009)  Physical Exam  General:  alert and well-developed.   Head:  normocephalic and atraumatic.   Eyes:  pupils equal and pupils round.   Nose:  no external deformity and no external erythema.   Neck:  No deformities, masses, or tenderness noted. Chest Wall:  No deformities, masses, or tenderness noted. Lungs:  Normal respiratory effort, chest expands symmetrically. Lungs are clear to auscultation, no crackles or wheezes. Heart:  Normal rate and regular rhythm. S1 and S2 normal without gallop, murmur, click, rub or other extra sounds.   Impression & Recommendations:  Problem # 1:  RHINITIS (ICD-477.9) doubt infectious trial mucinex dm and fluticasone  side effects discussed Her updated medication list for this problem includes:    Cvs Saline Nasal Spray 0.65 % Soln (Saline) ..... Once  daily    Fluticasone Propionate 50 Mcg/act Susp (Fluticasone propionate) .Marland Kitchen... 2 sprays each nostril once daily  Complete Medication List: 1)  Crestor 20 Mg Tabs (Rosuvastatin calcium) .... 1/2 every other day 2)  Metoprolol Tartrate 50 Mg Tabs (Metoprolol tartrate) .... Take 1 tablet by mouth two times a day 3)  Levothyroxine Sodium 100 Mcg Tabs (Levothyroxine sodium) .Marland Kitchen.. 1 by mouth daily 4)  Adult Aspirin Low Strength 81 Mg Tbdp (Aspirin) .... Three times weekly 5)  Multivitamins Tabs (Multiple vitamin) .... Qd 6)  Tums 500 Mg Chew (Calcium carbonate antacid) .... 3 qd 7)  Cvs Saline Nasal Spray 0.65 % Soln (Saline) .... Once daily 8)  Triamcinolone Acetonide 0.1 % Crea (Triamcinolone acetonide) .... Use as directed 9)  Lisinopril 20 Mg Tabs (Lisinopril) .Marland Kitchen.. 1 tablet by mouth daily 10)  Aciphex 20 Mg Tbec (Rabeprazole sodium) .... Take 1 tab every morning 11)  Glucosamine Sulfate 500 Mg Tabs (Glucosamine sulfate) .... Take two times a day 12)  Vitamin D 1000 Unit Tabs (Cholecalciferol) .... Once daily 13)  Mucinex Dm 30-600 Mg Xr12h-tab (Dextromethorphan-guaifenesin) .... Take 1 tablet by mouth two times a day 14)  Fluticasone Propionate 50 Mcg/act Susp (Fluticasone propionate) .... 2 sprays each nostril once daily  Patient Instructions: 1)  Please schedule a follow-up appointment in 1 month. Prescriptions: FLUTICASONE PROPIONATE 50 MCG/ACT  SUSP (FLUTICASONE PROPIONATE) 2 sprays each nostril once daily  #1 vial x 3   Entered and Authorized by:   Birdie Sons MD   Signed by:   Birdie Sons MD on 11/27/2009   Method used:   Electronically to        [facility]  [phone]* (retail)       [address]       [place], [place]  [address]       Ph: [phone] or [phone]       Fax: [phone]   RxID:   [id number]

## 2010-08-05 NOTE — Assessment & Plan Note (Signed)
Summary: 6 month rov/njr/pt rescd//ccm---PT RSC (BMP) // RS   Vital Signs:  Patient profile:   77 year old female Weight:      168 pounds Temp:     97.8 degrees F oral BP sitting:   130 / 72  (left arm) Cuff size:   regular  Vitals Entered By: Sid Falcon LPN (April 02, 2010 9:36 AM)  Primary Care Provider:  Birdie Sons MD   History of Present Illness:  Follow-Up Visit      This is an 77 year old woman who presents for Follow-up visit.  The patient denies chest pain and palpitations.  Since the last visit the patient notes no new problems or concerns--has some sinus congestion---minimal. Minimal vertigo (improving).  The patient reports taking meds as prescribed.  When questioned about possible medication side effects, the patient notes   All other systems reviewed and were negative   Current Problems (verified): 1)  Rhinitis  (ICD-477.9) 2)  Diverticulosis of Colon  (ICD-562.10) 3)  Personal History of Colonic Polyps  (ICD-V12.72) 4)  Left Bundle Branch Block  (ICD-426.3) 5)  Hyperlipidemia  (ICD-272.4) 6)  Hypothyroidism  (ICD-244.9) 7)  Hypertension  (ICD-401.9) 8)  Skin Cancer, Hx of  (ICD-V10.83) 9)  Asthma  (ICD-493.90)  Current Medications (verified): 1)  Crestor 20 Mg Tabs (Rosuvastatin Calcium) .... 1/2 Every Other Day 2)  Metoprolol Tartrate 50 Mg Tabs (Metoprolol Tartrate) .... Take 1 Tablet By Mouth Two Times A Day 3)  Levothyroxine Sodium 100 Mcg  Tabs (Levothyroxine Sodium) .Marland Kitchen.. 1 By Mouth Daily 4)  Adult Aspirin Low Strength 81 Mg  Tbdp (Aspirin) .... Three Times Weekly 5)  Multivitamins   Tabs (Multiple Vitamin) .... Qd 6)  Tums 500 Mg  Chew (Calcium Carbonate Antacid) .... 3 Qd 7)  Triamcinolone Acetonide 0.1 %  Crea (Triamcinolone Acetonide) .... Use As Directed 8)  Lisinopril 20 Mg Tabs (Lisinopril) .Marland Kitchen.. 1 Tablet By Mouth Daily 9)  Aciphex 20 Mg Tbec (Rabeprazole Sodium) .... Take 1 Tab Every Morning 10)  Glucosamine Sulfate 500 Mg Tabs  (Glucosamine Sulfate) .... Take Two Times A Day 11)  Vitamin D 1000 Unit Tabs (Cholecalciferol) .... Once Daily 12)  Fluticasone Propionate 50 Mcg/act  Susp (Fluticasone Propionate) .... 2 Sprays Each Nostril Once Daily 13)  Fluorouracil 5 % Crea (Fluorouracil) .... Use Twice Daily As Directed  Allergies: 1)  ! * Novacaine  Past History:  Past Medical History: Last updated: 01/10/2008 Allergic rhinitis Asthma Skin cancer, hx of-Rt shoulder Hypertension Hypothyroidism Hyperlipidemia Adenomatous Colon Polyps Diverticulosis Esophageal Stricture Hemorrhoids  Past Surgical History: Last updated: 01/10/2008 Cataract extraction Sinus surgery Shoulder sx Colonoscopy-11/25/2003 Tubal Ligation- 1959  Family History: Last updated: 01/10/2008 Family History of Heart Disease: Brother Bone cancer: Mother  Social History: Last updated: 04/11/2007 Former Smoker Regular exercise-no Retired  Risk Factors: Exercise: no (04/11/2007)  Risk Factors: Smoking Status: quit (12/24/2009)  Physical Exam  General:  alert and well-developed.   Head:  normocephalic and atraumatic.   Eyes:  pupils equal and pupils round.   Neck:  No deformities, masses, or tenderness noted. Chest Wall:  No deformities, masses, or tenderness noted. Lungs:  Normal respiratory effort, chest expands symmetrically. Lungs are clear to auscultation, no crackles or wheezes. Heart:  normal rate and regular rhythm.   Abdomen:  soft and non-tender.   Skin:  turgor normal and color normal.   Psych:  good eye contact and not anxious appearing.     Impression & Recommendations:  Problem #  1:  RHINITIS (ICD-477.9) Assessment Unchanged better continue current medications  not worth trying additional meds Her updated medication list for this problem includes:    Fluticasone Propionate 50 Mcg/act Susp (Fluticasone propionate) .Marland Kitchen... 2 sprays each nostril once daily  Problem # 2:  HYPERLIPIDEMIA  (ICD-272.4) controlled continue current medications  Her updated medication list for this problem includes:    Crestor 20 Mg Tabs (Rosuvastatin calcium) .Marland Kitchen... 1/2 every other day  Labs Reviewed: SGOT: 23 (03/17/2010)   SGPT: 19 (03/17/2010)   HDL:52.00 (03/17/2010), 53.00 (08/10/2009)  LDL:84 (03/17/2010), 58 (08/10/2009)  Chol:154 (03/17/2010), 137 (08/10/2009)  Trig:91.0 (03/17/2010), 132.0 (08/10/2009)  Problem # 3:  HYPOTHYROIDISM (ICD-244.9) controlled continue current medications  Her updated medication list for this problem includes:    Levothyroxine Sodium 100 Mcg Tabs (Levothyroxine sodium) .Marland Kitchen... 1 by mouth daily  Labs Reviewed: TSH: 0.81 (08/10/2009)    Chol: 154 (03/17/2010)   HDL: 52.00 (03/17/2010)   LDL: 84 (03/17/2010)   TG: 91.0 (03/17/2010)  Complete Medication List: 1)  Crestor 20 Mg Tabs (Rosuvastatin calcium) .... 1/2 every other day 2)  Metoprolol Tartrate 50 Mg Tabs (Metoprolol tartrate) .... Take 1 tablet by mouth two times a day 3)  Levothyroxine Sodium 100 Mcg Tabs (Levothyroxine sodium) .Marland Kitchen.. 1 by mouth daily 4)  Adult Aspirin Low Strength 81 Mg Tbdp (Aspirin) .... Three times weekly 5)  Multivitamins Tabs (Multiple vitamin) .... Qd 6)  Tums 500 Mg Chew (Calcium carbonate antacid) .... 3 qd 7)  Triamcinolone Acetonide 0.1 % Crea (Triamcinolone acetonide) .... Use as directed 8)  Lisinopril 20 Mg Tabs (Lisinopril) .Marland Kitchen.. 1 tablet by mouth daily 9)  Aciphex 20 Mg Tbec (Rabeprazole sodium) .... Take 1 tab every morning 10)  Glucosamine Sulfate 500 Mg Tabs (Glucosamine sulfate) .... Take two times a day 11)  Vitamin D 1000 Unit Tabs (Cholecalciferol) .... Once daily 12)  Fluticasone Propionate 50 Mcg/act Susp (Fluticasone propionate) .... 2 sprays each nostril once daily 13)  Fluorouracil 5 % Crea (Fluorouracil) .... Use twice daily as directed  Patient Instructions: 1)  Please schedule a follow-up appointment in 6 months. 2)  lipids 272.4 3)  liver 995.2 4)   bmet 5)  cbc diff 6)  tsh-244.9

## 2010-09-02 ENCOUNTER — Encounter: Payer: Self-pay | Admitting: Internal Medicine

## 2010-09-03 ENCOUNTER — Ambulatory Visit (INDEPENDENT_AMBULATORY_CARE_PROVIDER_SITE_OTHER): Payer: Medicare Other | Admitting: Internal Medicine

## 2010-09-03 ENCOUNTER — Encounter: Payer: Self-pay | Admitting: Internal Medicine

## 2010-09-03 VITALS — BP 120/78 | HR 64 | Temp 98.7°F | Wt 158.0 lb

## 2010-09-03 DIAGNOSIS — M542 Cervicalgia: Secondary | ICD-10-CM

## 2010-09-03 DIAGNOSIS — I10 Essential (primary) hypertension: Secondary | ICD-10-CM

## 2010-09-03 MED ORDER — FLUTICASONE PROPIONATE 50 MCG/ACT NA SUSP: 2.0000 | Freq: Every day | NASAL | Status: DC

## 2010-09-03 NOTE — Progress Notes (Signed)
  Subjective:    Patient ID: Robin Ferguson, female    DOB: Aug 22, 1922, 77 y.o.   MRN: [id number]  HPI Comes in to discuss bp meds---she has had difficulty cutting lisinopril in 1/2.  In addition has had intermittent neck pain radiating to left arm to elbow. This is not exertional. No weakness, no SOB or any other associated sxs.  Past Medical History  Diagnosis Date  . Allergy     rhinitis  . Asthma   . Cancer     skin- right shoulder  . Hypertension   . Hypothyroidism   . Hyperlipidemia   . Hx of adenomatous colonic polyps   . Diverticulosis   . Esophageal stricture   . Hemorrhoids    Past Surgical History  Procedure Date  . Cataract extraction   . Nasal sinus surgery   . Shoulder surgery   . Tubal ligation 1959    reports that she has quit smoking. She does not have any smokeless tobacco history on file. Her alcohol and drug histories not on file. family history includes Cancer in her mother and Heart disease in her brother.    Allergies  Allergen Reactions  . Procaine Hcl     REACTION: heart palpitationbs     Review of Systems  patient denies chest pain, shortness of breath, orthopnea. Denies lower extremity edema, abdominal pain, change in appetite, change in bowel movements. Patient denies rashes, musculoskeletal complaints. No other specific complaints in a complete review of systems.      Objective:   Physical Exam  Well-developed well-nourished female in no acute distress. HEENT exam atraumatic, normocephalic, extraocular muscles are intact. Neck is supple. No jugular venous distention no thyromegaly. Chest clear to auscultation without increased work of breathing. Cardiac exam S1 and S2 are regular. Abdominal exam active bowel sounds, soft, nontender. Extremities no edema. Neurologic exam she is alert without any motor sensory deficits. Gait is normal.        Assessment & Plan:  Presumed radicular pain (cervical)---no rx at this time

## 2010-09-03 NOTE — Assessment & Plan Note (Signed)
Controlled Pt instructed on how to cut meds in 1/2---demonstrated in office

## 2010-09-07 ENCOUNTER — Telehealth: Payer: Self-pay | Admitting: Internal Medicine

## 2010-09-07 DIAGNOSIS — E039 Hypothyroidism, unspecified: Secondary | ICD-10-CM

## 2010-09-07 MED ORDER — LEVOTHYROXINE SODIUM 100 MCG PO TABS: 100.0000 ug | ORAL_TABLET | Freq: Every day | ORAL | Status: DC

## 2010-09-07 NOTE — Telephone Encounter (Signed)
rx sent in electronically 

## 2010-09-07 NOTE — Telephone Encounter (Signed)
°  Pt c/b to adv that she only needs 2 wks worth of synthroid med sent to [facility], 14 pills to do her till her mail order rx arrives.

## 2010-09-07 NOTE — Telephone Encounter (Signed)
Pt needs a refill on med: synthroid 0.1mg ..... [facility] [address].

## 2010-10-05 ENCOUNTER — Other Ambulatory Visit (INDEPENDENT_AMBULATORY_CARE_PROVIDER_SITE_OTHER): Payer: Medicare Other | Admitting: Internal Medicine

## 2010-10-05 DIAGNOSIS — E039 Hypothyroidism, unspecified: Secondary | ICD-10-CM

## 2010-10-05 DIAGNOSIS — E785 Hyperlipidemia, unspecified: Secondary | ICD-10-CM

## 2010-10-05 LAB — CBC WITH DIFFERENTIAL/PLATELET
Basophils Relative: 1 % (ref 0.0–3.0)
Eosinophils Absolute: 0.2 10*3/uL (ref 0.0–0.7)
HCT: 42.9 % (ref 36.0–46.0)
Hemoglobin: 14.5 g/dL (ref 12.0–15.0)
Lymphocytes Relative: 32.2 % (ref 12.0–46.0)
MCHC: 33.8 g/dL (ref 30.0–36.0)
MCV: 90.5 fl (ref 78.0–100.0)
Neutro Abs: 3 10*3/uL (ref 1.4–7.7)
RBC: 4.74 Mil/uL (ref 3.87–5.11)

## 2010-10-06 LAB — BASIC METABOLIC PANEL
CO2: 29 mEq/L (ref 19–32)
Calcium: 9.2 mg/dL (ref 8.4–10.5)
Chloride: 104 mEq/L (ref 96–112)
Potassium: 4.5 mEq/L (ref 3.5–5.1)
Sodium: 143 mEq/L (ref 135–145)

## 2010-10-06 LAB — LIPID PANEL
HDL: 49.7 mg/dL (ref >39.00)
LDL Cholesterol: 91 mg/dL (ref 0–99)
Total CHOL/HDL Ratio: 3
Triglycerides: 132 mg/dL (ref 0.0–149.0)
VLDL: 26.4 mg/dL (ref 0.0–40.0)

## 2010-10-06 LAB — HEPATIC FUNCTION PANEL: Albumin: 4 g/dL (ref 3.5–5.2)

## 2010-10-12 ENCOUNTER — Ambulatory Visit (INDEPENDENT_AMBULATORY_CARE_PROVIDER_SITE_OTHER): Payer: Medicare Other | Admitting: Internal Medicine

## 2010-10-12 ENCOUNTER — Encounter: Payer: Self-pay | Admitting: Internal Medicine

## 2010-10-12 DIAGNOSIS — E039 Hypothyroidism, unspecified: Secondary | ICD-10-CM

## 2010-10-12 DIAGNOSIS — I10 Essential (primary) hypertension: Secondary | ICD-10-CM

## 2010-10-12 DIAGNOSIS — E785 Hyperlipidemia, unspecified: Secondary | ICD-10-CM

## 2010-10-12 DIAGNOSIS — Z85828 Personal history of other malignant neoplasm of skin: Secondary | ICD-10-CM

## 2010-10-12 NOTE — Assessment & Plan Note (Signed)
No longer taking  the Efudex.

## 2010-10-12 NOTE — Assessment & Plan Note (Signed)
Reasonable control. Continue current medications. 

## 2010-10-12 NOTE — Progress Notes (Signed)
  Subjective:    Patient ID: Robin Ferguson, female    DOB: May 12, 1923, 77 y.o.   MRN: [id number]  HPI  Pt here for f/u htn---tolerating meds without difficulty---no home BPs Hypothyroid---tolerating replacement Lipids---on crestor qod  Past Medical History  Diagnosis Date  . Allergy     rhinitis  . Asthma   . Cancer     skin- right shoulder  . Hypertension   . Hypothyroidism   . Hyperlipidemia   . Hx of adenomatous colonic polyps   . Diverticulosis   . Esophageal stricture   . Hemorrhoids    Past Surgical History  Procedure Date  . Cataract extraction   . Nasal sinus surgery   . Shoulder surgery   . Tubal ligation 1959    reports that she has quit smoking. She does not have any smokeless tobacco history on file. Her alcohol and drug histories not on file. family history includes Cancer in her mother and Heart disease in her brother. Allergies  Allergen Reactions  . Procaine Hcl     REACTION: heart palpitationbs      Review of Systems  patient denies chest pain, shortness of breath, orthopnea. Denies lower extremity edema, abdominal pain, change in appetite, change in bowel movements. Patient denies rashes, musculoskeletal complaints. No other specific complaints in a complete review of systems. She does admit to some fatigue and tiredness---she relates to recent travel to [facility] Current outpatient prescriptions:aspirin 81 MG tablet, Take 81 mg by mouth. Take three times weekly , Disp: , Rfl: ;  calcium carbonate (TUMS) 500 MG chewable tablet, Chew 3 tablets by mouth daily.  , Disp: , Rfl: ;  cholecalciferol (VITAMIN D) 1000 UNITS tablet, Take 1,000 Units by mouth daily.  , Disp: , Rfl: ;  fluticasone (FLONASE) 50 MCG/ACT nasal spray, 2 sprays by Nasal route daily., Disp: 16 g, Rfl: 11 glucosamine-chondroitin 500-400 MG tablet, Take 1 tablet by mouth 2 (two) times daily.  , Disp: , Rfl: ;  levothyroxine (SYNTHROID, LEVOTHROID) 100 MCG tablet, Take 1 tablet (100 mcg total) by  mouth daily., Disp: 14 tablet, Rfl: 0;  lisinopril (PRINIVIL,ZESTRIL) 20 MG tablet, Take 20 mg by mouth daily.  , Disp: , Rfl: ;  metoprolol (LOPRESSOR) 50 MG tablet, Take 50 mg by mouth 2 (two) times daily.  , Disp: , Rfl:  multivitamin (THERAGRAN) per tablet, Take 1 tablet by mouth daily.  , Disp: , Rfl: ;  RABEprazole (ACIPHEX) 20 MG tablet, Take 20 mg by mouth daily.  , Disp: , Rfl: ;  rosuvastatin (CRESTOR) 20 MG tablet, Take 20 mg by mouth every other day. Take 1/2 tablet , Disp: , Rfl: ;  triamcinolone (KENALOG) 0.1 % cream, Apply topically as directed.  , Disp: , Rfl:  DISCONTD: fluorouracil (EFUDEX) 5 % cream, Apply topically 2 (two) times daily.  , Disp: , Rfl:      Objective:   Physical Exam  Well-developed well-nourished female in no acute distress. HEENT exam atraumatic, normocephalic, extraocular muscles are intact. Neck is supple. No jugular venous distention no thyromegaly. Chest clear to auscultation without increased work of breathing. Cardiac exam S1 and S2 are regular. Abdominal exam active bowel sounds, soft, nontender. Extremities no edema.  Gait is normal. Several echymoses on legs and arms       Assessment & Plan:

## 2010-10-12 NOTE — Assessment & Plan Note (Signed)
Reviewed recent lab results with the patient. Continue current dose of Crestor. She only takes it every other day.

## 2010-10-12 NOTE — Assessment & Plan Note (Signed)
At goal. Continue current replacement therapy.

## 2010-10-15 ENCOUNTER — Other Ambulatory Visit: Payer: Self-pay | Admitting: *Deleted

## 2010-10-15 DIAGNOSIS — E785 Hyperlipidemia, unspecified: Secondary | ICD-10-CM

## 2010-10-15 MED ORDER — ROSUVASTATIN CALCIUM 20 MG PO TABS: 20.0000 mg | ORAL_TABLET | ORAL | Status: DC

## 2010-11-08 ENCOUNTER — Ambulatory Visit (INDEPENDENT_AMBULATORY_CARE_PROVIDER_SITE_OTHER): Payer: Medicare Other | Admitting: Internal Medicine

## 2010-11-08 ENCOUNTER — Encounter: Payer: Self-pay | Admitting: Internal Medicine

## 2010-11-08 DIAGNOSIS — R21 Rash and other nonspecific skin eruption: Secondary | ICD-10-CM | POA: Insufficient documentation

## 2010-11-08 MED ORDER — METHYLPREDNISOLONE (PAK) 4 MG PO TABS: ORAL_TABLET | ORAL | Status: AC

## 2010-11-08 NOTE — Assessment & Plan Note (Signed)
Most suggestive of contact dermatitis with poison ivy. Begin Medrol Dosepak. Followup if no improvement or worsening.

## 2010-11-08 NOTE — Progress Notes (Signed)
  Subjective:    Patient ID: Robin Ferguson, female    DOB: 06-20-1923, 77 y.o.   MRN: [id number]  HPIPt presents to clinic for evaluation of rash. Was working in the yard last week that approximately 2 days later developed diffuse erythematous raised rash along forehead arms and upper chest. Has minimal itching associated. No spread of the rash. Rash is not painful nor is it in a nondermatomal distribution. Continue medication for the problem. No alleviating or exacerbating factors. No other complaints.  Reviewed past medical history, medications and allergies.  Review of Systems  Constitutional: Negative for fever and chills.  Skin: Positive for color change and rash. Negative for wound.  Neurological: Negative for numbness.       Objective:   Physical Exam  [nursing notereviewed. Constitutional: She appears well-developed and well-nourished. No distress.  HENT:  Head: Normocephalic and atraumatic.  Right Ear: External ear normal.  Left Ear: External ear normal.  Nose: Nose normal.  Mouth/Throat: Oropharynx is clear and moist.  Eyes: Conjunctivae are normal. No scleral icterus.  Neurological: She is alert.  Skin: Skin is warm and dry. Rash noted. She is not diaphoretic. There is erythema.       Diffuse erythematous papular rash located primarily forehead and bilateral lower arms. No obvious secondary infection  Psychiatric: She has a normal mood and affect.          Assessment & Plan:

## 2010-11-16 ENCOUNTER — Ambulatory Visit (INDEPENDENT_AMBULATORY_CARE_PROVIDER_SITE_OTHER): Payer: Medicare Other | Admitting: Internal Medicine

## 2010-11-16 ENCOUNTER — Encounter: Payer: Self-pay | Admitting: Internal Medicine

## 2010-11-16 VITALS — BP 130/70 | HR 66

## 2010-11-16 DIAGNOSIS — R21 Rash and other nonspecific skin eruption: Secondary | ICD-10-CM

## 2010-11-16 MED ORDER — METHYLPREDNISOLONE ACETATE 80 MG/ML IJ SUSP
80.0000 mg | Freq: Once | INTRAMUSCULAR | Status: AC
  Administered 2010-11-16: 80 mg via INTRAMUSCULAR

## 2010-11-16 NOTE — Progress Notes (Signed)
  Subjective:    Patient ID: Robin Ferguson, female    DOB: [birth year], 77 y.o.   MRN: [id number]  HPI Pt presents to clinic for re-evaluation of rash. Seen 5/7 with rash felt to be poison ivy involving face/forehead and bilateral lower arms. Recently completed medrol dosepak and continues to use triamcinolone bid. Face involvement has almost entirely resolved. Rash persists on bilateral lower arms without signficant improvement. C/o itching but denies pain.  No new areas of rash or spread. No exacerbating or alleviating factors. No other complaints.  Reviewed pmh, medications and allergies.    Review of Systems  Constitutional: Negative for fever and chills.  Skin: Positive for rash. Negative for color change, pallor and wound.  Neurological: Negative for numbness.       Objective:   Physical Exam  [nursing notereviewed. Constitutional: She appears well-developed and well-nourished. No distress.  HENT:  Head: Normocephalic and atraumatic.  Right Ear: External ear normal.  Nose: Nose normal.  Eyes: Conjunctivae are normal. No scleral icterus.  Neurological: She is alert.  Skin: Skin is warm and dry. Rash noted. She is not diaphoretic. There is erythema. No pallor.       Face:rash resolved Bilateral lower arms: stable erythematous vesicular rash persists. No dermatomal pattern. No obvious secondary infection.  Psychiatric: She has a normal mood and affect.          Assessment & Plan:

## 2010-11-16 NOTE — Assessment & Plan Note (Signed)
Improved but persistent. Continue triamcinolone to arms bid. Depomedrol 80mg  IM today.

## 2010-11-19 ENCOUNTER — Ambulatory Visit (INDEPENDENT_AMBULATORY_CARE_PROVIDER_SITE_OTHER): Payer: Medicare Other | Admitting: Internal Medicine

## 2010-11-19 ENCOUNTER — Encounter: Payer: Self-pay | Admitting: Internal Medicine

## 2010-11-19 DIAGNOSIS — R21 Rash and other nonspecific skin eruption: Secondary | ICD-10-CM

## 2010-11-19 MED ORDER — METHYLPREDNISOLONE (PAK) 4 MG PO TABS: ORAL_TABLET | ORAL | Status: AC

## 2010-11-21 ENCOUNTER — Encounter: Payer: Self-pay | Admitting: Internal Medicine

## 2010-11-21 NOTE — Assessment & Plan Note (Signed)
Improved. Recommend continue triamcinolone use for now. Given printed prescription for Medrol Dosepak and if patient is still concerned and not improving enough after the next 3-4 days may repeat Medrol Dosepak. Reviewed pathophysiology of contact dermatitis and overall average clinical course.

## 2010-11-21 NOTE — Progress Notes (Signed)
  Subjective:    Patient ID: Robin Ferguson, female    DOB: [birth year], 77 y.o.   MRN: [id number]  HPI patient presents to clinic as a walk-in for evaluation of rash. Has been seen twice most recently for what is felt to be poison ivy. Initially involved the face and arms and was treated with Medrol Dosepak and triamcinolone. Face involvement resolved. Last visit received Depo-Medrol injection IM and arms have improved but not resolved. Remains itchy. There is no dermatomal distribution of pain and the areas are not spreading. No other alleviating or exacerbating factors. No other complaints.  Reviewed past medical history, medications and allergies    Review of Systems see history of present illness     Objective:   Physical Exam  Nursing note and vitals reviewed. Constitutional: She appears well-developed and well-nourished. No distress.  HENT:  Head: Normocephalic.  Eyes: Conjunctivae are normal.  Neurological: She is alert.  Skin: Skin is warm and dry. Rash noted. She is not diaphoretic. There is erythema. No pallor.       Facial involvement resolved. Bilateral ventral arms significantly improved. Still mild involvement of arms and there is a focal area of confluent erythematous blisters left ventral arm. No evidence of secondary bacterial infection  Psychiatric: She has a normal mood and affect.          Assessment & Plan:

## 2010-12-06 ENCOUNTER — Other Ambulatory Visit: Payer: Self-pay | Admitting: *Deleted

## 2010-12-06 DIAGNOSIS — I10 Essential (primary) hypertension: Secondary | ICD-10-CM

## 2010-12-06 DIAGNOSIS — E785 Hyperlipidemia, unspecified: Secondary | ICD-10-CM

## 2010-12-06 DIAGNOSIS — K219 Gastro-esophageal reflux disease without esophagitis: Secondary | ICD-10-CM

## 2010-12-06 DIAGNOSIS — E039 Hypothyroidism, unspecified: Secondary | ICD-10-CM

## 2010-12-06 MED ORDER — ROSUVASTATIN CALCIUM 20 MG PO TABS: 20.0000 mg | ORAL_TABLET | ORAL | Status: DC

## 2010-12-06 MED ORDER — LISINOPRIL 20 MG PO TABS: 20.0000 mg | ORAL_TABLET | Freq: Every day | ORAL | Status: DC

## 2010-12-06 MED ORDER — RABEPRAZOLE SODIUM 20 MG PO TBEC: 20.0000 mg | DELAYED_RELEASE_TABLET | Freq: Every day | ORAL | Status: DC

## 2010-12-06 MED ORDER — METOPROLOL TARTRATE 50 MG PO TABS: 50.0000 mg | ORAL_TABLET | Freq: Two times a day (BID) | ORAL | Status: DC

## 2010-12-06 MED ORDER — LEVOTHYROXINE SODIUM 100 MCG PO TABS: 100.0000 ug | ORAL_TABLET | Freq: Every day | ORAL | Status: DC

## 2010-12-07 ENCOUNTER — Telehealth: Payer: Self-pay | Admitting: *Deleted

## 2010-12-07 NOTE — Telephone Encounter (Signed)
Pt came to desk and ask if her prescriptions were ready-She was asked if she was dr swords pt and she said yes.  SHe states she brought them yesterday andneeded them written out.  I told her that it usually takes 24 to48 hours for refills.  SHe then asked for his nurse and I told hershe was working with another md and wh en I got a chance I would ask her about it.I asked her if anyone had called her to tell her they were ready and she said no. I told her we would call her when they were ready. /   I finally got time and talked with his nurse and she said her scripts were up front,. So I called pt and told her they were ready.  She walked thru the lobby to im pod and came to my desk and wants to know why she had to come back for her  Scripts and said I had been rude to her.  I told here I was not rude to her and that she should wait until someone calls her so sh e wouldn't make an unnecessary trip.

## 2010-12-24 ENCOUNTER — Other Ambulatory Visit: Payer: Self-pay | Admitting: *Deleted

## 2010-12-24 DIAGNOSIS — E785 Hyperlipidemia, unspecified: Secondary | ICD-10-CM

## 2010-12-24 MED ORDER — ROSUVASTATIN CALCIUM 20 MG PO TABS: 20.0000 mg | ORAL_TABLET | ORAL | Status: DC

## 2010-12-24 MED ORDER — ROSUVASTATIN CALCIUM 20 MG PO TABS: 30.0000 mg | ORAL_TABLET | ORAL | Status: DC

## 2010-12-30 ENCOUNTER — Encounter: Payer: Self-pay | Admitting: Internal Medicine

## 2011-03-28 ENCOUNTER — Telehealth: Payer: Self-pay | Admitting: Internal Medicine

## 2011-03-28 DIAGNOSIS — N950 Postmenopausal bleeding: Secondary | ICD-10-CM | POA: Insufficient documentation

## 2011-03-28 DIAGNOSIS — M8580 Other specified disorders of bone density and structure, unspecified site: Secondary | ICD-10-CM | POA: Insufficient documentation

## 2011-03-28 NOTE — Telephone Encounter (Signed)
Walk in-----pt is completely out of her Metoprolol 50mg  bid. She will need a 2 weeks supply sent to [facility] today and a mail order sent to [facility]--- [phone]

## 2011-03-28 NOTE — Telephone Encounter (Addendum)
rx sent in electronically 

## 2011-03-29 ENCOUNTER — Other Ambulatory Visit: Payer: Self-pay | Admitting: Internal Medicine

## 2011-03-29 ENCOUNTER — Telehealth: Payer: Self-pay | Admitting: Internal Medicine

## 2011-03-29 NOTE — Telephone Encounter (Signed)
rx sent in electronically, pt aware 

## 2011-03-29 NOTE — Telephone Encounter (Signed)
Pt called yesterday at 3:30 about refill on Metoprolol 50mg . Pt came into office today and wanted to know why prescription had not been filled . Pt was extremely upset that her prescription has not been called in yet. I explained that it could take up to 72 hours for refill. Please call into [facility] [address]. Pt said she will be back in if her RX is not called in.

## 2011-04-06 ENCOUNTER — Encounter: Payer: Self-pay | Admitting: Obstetrics and Gynecology

## 2011-04-06 ENCOUNTER — Other Ambulatory Visit (HOSPITAL_COMMUNITY)
Admission: RE | Admit: 2011-04-06 | Discharge: 2011-04-06 | Disposition: A | Discharge status: Home / Self Care | Payer: Medicare Other | Source: Ambulatory Visit | Attending: Obstetrics and Gynecology | Admitting: Obstetrics and Gynecology

## 2011-04-06 ENCOUNTER — Ambulatory Visit (INDEPENDENT_AMBULATORY_CARE_PROVIDER_SITE_OTHER): Payer: Medicare Other | Admitting: Obstetrics and Gynecology

## 2011-04-06 VITALS — BP 110/70 | Ht 61.5 in | Wt 148.0 lb

## 2011-04-06 DIAGNOSIS — R3915 Urgency of urination: Secondary | ICD-10-CM

## 2011-04-06 DIAGNOSIS — M949 Disorder of cartilage, unspecified: Secondary | ICD-10-CM

## 2011-04-06 DIAGNOSIS — R1031 Right lower quadrant pain: Secondary | ICD-10-CM

## 2011-04-06 DIAGNOSIS — Z124 Encounter for screening for malignant neoplasm of cervix: Secondary | ICD-10-CM | POA: Insufficient documentation

## 2011-04-06 DIAGNOSIS — N952 Postmenopausal atrophic vaginitis: Secondary | ICD-10-CM

## 2011-04-06 DIAGNOSIS — R350 Frequency of micturition: Secondary | ICD-10-CM

## 2011-04-06 DIAGNOSIS — M8580 Other specified disorders of bone density and structure, unspecified site: Secondary | ICD-10-CM

## 2011-04-06 NOTE — Progress Notes (Signed)
Subjective:     Patient ID: Robin Ferguson, female   DOB: Oct 20, 1922, 77 y.o.   MRN: [id number]  HPIpatient came back to see me today. She has had about 6 episodes of right or left lower quadrant pain. There not associated with any GI or GU symptoms. There in the area of her ovaries. Patient does have osteopenia. She is due for followup bone density. She takes calcium and vitamin D. She's had no fractures. She does have atrophic vaginitis but is not symptomatic. She is using her estrogen cream. The patient continues to have some urgency with urination. She also has some urinary frequency. We will check her urine today. She does not feel that requires intervention with medication. She has not had any vaginal bleeding.   Review of Systems  Constitutional: Negative.   HENT: Negative.   Eyes: Negative.   Respiratory:       Asthma  Cardiovascular:       Left bundle-branch block, hyperlipidemia.  Gastrointestinal:       Diverticulosis  Genitourinary: Positive for urgency and frequency.  Musculoskeletal: Negative.   Skin:       Skin cancer  Neurological: Negative.   Hematological: Negative.   Psychiatric/Behavioral: Negative.        Objective:   Physical ExamPhysical examination: HEENT within normal limits. Neck: Thyroid not large. No masses. Supraclavicular nodes: not enlarged. Breasts: Examined in both sitting midline position. No skin changes and no masses. Abdomen: Soft no guarding rebound or masses or hernia. Pelvic: External: Within normal limits. BUS: Within normal limits. Vaginal:within normal limits. Good estrogen effect. No evidence of cystocele rectocele or enterocele. Cervix: clean. Uterus: Normal size and shape. Adnexa: No masses. Rectovaginal exam: Confirmatory and negative. Extremities: Within normal limits.     Assessment:     #1. Atrophic vaginitis #2. Osteopenia #3. Urinary urgency #4. Lower pelvic pain    Plan:     I had patient scheduled bone density and polyp  pelvic ultrasound

## 2011-04-12 ENCOUNTER — Ambulatory Visit: Payer: Medicare Other | Admitting: Internal Medicine

## 2011-04-15 ENCOUNTER — Ambulatory Visit (INDEPENDENT_AMBULATORY_CARE_PROVIDER_SITE_OTHER): Payer: Medicare Other | Admitting: Obstetrics and Gynecology

## 2011-04-15 ENCOUNTER — Ambulatory Visit (INDEPENDENT_AMBULATORY_CARE_PROVIDER_SITE_OTHER): Payer: Medicare Other

## 2011-04-15 DIAGNOSIS — R1032 Left lower quadrant pain: Secondary | ICD-10-CM

## 2011-04-15 DIAGNOSIS — N949 Unspecified condition associated with female genital organs and menstrual cycle: Secondary | ICD-10-CM

## 2011-04-15 DIAGNOSIS — R102 Pelvic and perineal pain: Secondary | ICD-10-CM

## 2011-04-15 DIAGNOSIS — R1031 Right lower quadrant pain: Secondary | ICD-10-CM

## 2011-04-15 NOTE — Progress Notes (Signed)
Patient came back to see me today because of pelvic pain. Please see last office visit notes. On ultrasound today her uterus appeared normal. Her endometrial echo is 4 mm. Her right ovary is completely normal. Her left ovary was without tumor. There was some avascular calcification seen on the left ovary unassociated with the tumor. Her cul-de-sac is free of fluid.  Assessment: Pelvic pain with normal ultrasound.  Plan: Patient reassured. Suggest at office visit with Dr. Cato Mulligan to discuss the above. Patient feels there is some relationship between constipation and the pain.

## 2011-04-26 ENCOUNTER — Encounter (INDEPENDENT_AMBULATORY_CARE_PROVIDER_SITE_OTHER): Payer: Medicare Other

## 2011-04-26 DIAGNOSIS — M8580 Other specified disorders of bone density and structure, unspecified site: Secondary | ICD-10-CM

## 2011-04-26 DIAGNOSIS — M899 Disorder of bone, unspecified: Secondary | ICD-10-CM

## 2011-05-27 ENCOUNTER — Ambulatory Visit: Payer: Medicare Other | Admitting: Internal Medicine

## 2011-06-10 ENCOUNTER — Ambulatory Visit: Payer: Medicare Other | Admitting: Family Medicine

## 2011-06-10 ENCOUNTER — Encounter: Payer: Self-pay | Admitting: Family Medicine

## 2011-06-10 ENCOUNTER — Ambulatory Visit (INDEPENDENT_AMBULATORY_CARE_PROVIDER_SITE_OTHER): Payer: Medicare Other | Admitting: Family Medicine

## 2011-06-10 VITALS — BP 128/74 | HR 87 | Temp 98.5°F | Ht 61.5 in | Wt 145.0 lb

## 2011-06-10 DIAGNOSIS — J069 Acute upper respiratory infection, unspecified: Secondary | ICD-10-CM | POA: Insufficient documentation

## 2011-06-10 MED ORDER — CETIRIZINE HCL 10 MG PO TABS: 10.0000 mg | ORAL_TABLET | Freq: Every day | ORAL | Status: DC

## 2011-06-10 NOTE — Assessment & Plan Note (Signed)
Patient will obtain OTC Zyrtec (or generic equivalent) and take once daily.  Patient advised to return if symptom worsen or persist.

## 2011-06-10 NOTE — Progress Notes (Signed)
Subjective:    Patient ID: Robin Ferguson, female    DOB: 1922-10-01, 77 y.o.   MRN: [id number]  HPI Ms. Smartt is in today with c/o sore throat and cough x 2 days. She has been using salt water gargles and asprin that has not helped much. Cough is productive with clear to yellow sputum.   Review of Systems  Constitutional: Negative for fever.  HENT: Positive for congestion and postnasal drip. Negative for ear pain, nosebleeds, neck pain, neck stiffness and ear discharge.   Respiratory: Negative.   Cardiovascular: Negative.   Musculoskeletal: Negative for arthralgias.  Neurological: Negative for dizziness.  Psychiatric/Behavioral: Negative.    Past Medical History  Diagnosis Date  . Allergy     rhinitis  . Asthma   . Hypertension   . Hypothyroidism   . Hyperlipidemia   . Hx of adenomatous colonic polyps   . Diverticulosis   . Esophageal stricture   . Hemorrhoids   . PMB (postmenopausal bleeding)   . Tachycardia   . Cancer     skin- right shoulder-Squamous cell-legs  . Osteopenia     History   Social History  . Marital Status: Widowed    Spouse Name: N/A    Number of Children: N/A  . Years of Education: N/A   Occupational History  . Not on file.   Social History Main Topics  . Smoking status: Former Games developer  . Smokeless tobacco: Not on file  . Alcohol Use: Yes     occas  . Drug Use: Not on file  . Sexually Active: Yes    Birth Control/ Protection: Post-menopausal, Surgical   Other Topics Concern  . Not on file   Social History Narrative  . No narrative on file    Past Surgical History  Procedure Date  . Cataract extraction   . Nasal sinus surgery   . Shoulder surgery   . Tubal ligation 1959  . Hysteroscopy   . Dilation and curettage of uterus     Family History  Problem Relation Age of Onset  . Cancer Mother     bone  . Heart disease Brother   . Diabetes Brother   . Heart disease Brother   . Diabetes Brother     Allergies  Allergen  Reactions  . Procaine Hcl     REACTION: heart palpitationbs    Current Outpatient Prescriptions on File Prior to Visit  Medication Sig Dispense Refill  . aspirin 81 MG tablet Take 81 mg by mouth. Take three times weekly       . calcium carbonate (TUMS) 500 MG chewable tablet Chew 3 tablets by mouth daily.        . cholecalciferol (VITAMIN D) 1000 UNITS tablet Take 1,000 Units by mouth daily.        . fluticasone (FLONASE) 50 MCG/ACT nasal spray 2 sprays by Nasal route daily.  16 g  11  . glucosamine-chondroitin 500-400 MG tablet Take 1 tablet by mouth 2 (two) times daily.        Marland Kitchen levothyroxine (SYNTHROID, LEVOTHROID) 100 MCG tablet Take 1 tablet (100 mcg total) by mouth daily.  90 tablet  3  . lisinopril (PRINIVIL,ZESTRIL) 20 MG tablet Take 1 tablet (20 mg total) by mouth daily.  90 tablet  3  . metoprolol (LOPRESSOR) 50 MG tablet TAKE ONE TABLET BY MOUTH TWICE DAILY  30 tablet  5  . multivitamin (THERAGRAN) per tablet Take 1 tablet by mouth daily.        Marland Kitchen  RABEprazole (ACIPHEX) 20 MG tablet Take 1 tablet (20 mg total) by mouth daily.  90 tablet  3  . rosuvastatin (CRESTOR) 20 MG tablet Take 1.5 tablets (30 mg total) by mouth every other day.  90 tablet  3  . triamcinolone (KENALOG) 0.1 % cream Apply topically as directed.          BP 128/74  Pulse 87  Temp(Src) 98.5 F (36.9 C) (Oral)  Ht 5' 1.5" (1.562 m)  Wt 145 lb (65.772 kg)  BMI 26.95 kg/m2      Objective:   Physical Exam  Constitutional: She is oriented to person, place, and time. She appears well-developed and well-nourished.  HENT:  Head: Normocephalic.  Right Ear: External ear normal.  Left Ear: External ear normal.  Mouth/Throat: Oropharyngeal exudate present.  Neck: Neck supple.  Cardiovascular: Normal rate, regular rhythm and normal heart sounds.   Pulmonary/Chest: Effort normal and breath sounds normal.  Neurological: She is alert and oriented to person, place, and time.  Skin: Skin is warm and dry. No rash  noted.  Psychiatric: She has a normal mood and affect.          Assessment & Plan:

## 2011-06-10 NOTE — Patient Instructions (Signed)

## 2011-06-12 NOTE — Progress Notes (Signed)
  Subjective:    Patient ID: Robin Ferguson, female    DOB: 24-Apr-1923, 77 y.o.   MRN: [id number]  HPI    Review of Systems     Objective:   Physical Exam        Assessment & Plan:

## 2011-06-20 ENCOUNTER — Ambulatory Visit (INDEPENDENT_AMBULATORY_CARE_PROVIDER_SITE_OTHER): Payer: Medicare Other | Admitting: Internal Medicine

## 2011-06-20 ENCOUNTER — Encounter: Payer: Self-pay | Admitting: Internal Medicine

## 2011-06-20 VITALS — BP 166/82 | HR 96 | Temp 98.6°F | Wt 147.0 lb

## 2011-06-20 DIAGNOSIS — I10 Essential (primary) hypertension: Secondary | ICD-10-CM

## 2011-06-20 DIAGNOSIS — E785 Hyperlipidemia, unspecified: Secondary | ICD-10-CM

## 2011-06-20 DIAGNOSIS — E039 Hypothyroidism, unspecified: Secondary | ICD-10-CM

## 2011-06-20 DIAGNOSIS — Z23 Encounter for immunization: Secondary | ICD-10-CM

## 2011-06-20 LAB — LIPID PANEL
Cholesterol: 139 mg/dL (ref 0–200)
Triglycerides: 105 mg/dL (ref 0.0–149.0)

## 2011-06-20 LAB — HEPATIC FUNCTION PANEL
ALT: 21 U/L (ref 0–35)
Albumin: 3.9 g/dL (ref 3.5–5.2)
Total Protein: 6.4 g/dL (ref 6.0–8.3)

## 2011-06-20 LAB — TSH: TSH: 0.88 u[IU]/mL (ref 0.35–5.50)

## 2011-06-20 NOTE — Progress Notes (Signed)
Subjective:    Patient ID: Robin Ferguson, female    DOB: 1923-05-05, 77 y.o.   MRN: [id number]  HPI  Patient Active Problem List  Diagnoses  . HYPOTHYROIDISM---tolerating replacement  . HYPERLIPIDEMIA---tolerating meds  . HYPERTENSION--- no home BPs but compliant with meds   New complaint- URI sxs for 2 weeks-resolving on cetirizine Past Medical History  Diagnosis Date  . Allergy     rhinitis  . Asthma   . Hypertension   . Hypothyroidism   . Hyperlipidemia   . Hx of adenomatous colonic polyps   . Diverticulosis   . Esophageal stricture   . Hemorrhoids   . PMB (postmenopausal bleeding)   . Tachycardia   . Cancer     skin- right shoulder-Squamous cell-legs  . Osteopenia     History   Social History  . Marital Status: Widowed    Spouse Name: N/A    Number of Children: N/A  . Years of Education: N/A   Occupational History  . Not on file.   Social History Main Topics  . Smoking status: Former Games developer  . Smokeless tobacco: Not on file  . Alcohol Use: Yes     occas  . Drug Use: Not on file  . Sexually Active: Yes    Birth Control/ Protection: Post-menopausal, Surgical   Other Topics Concern  . Not on file   Social History Narrative  . No narrative on file    Past Surgical History  Procedure Date  . Cataract extraction   . Nasal sinus surgery   . Shoulder surgery   . Tubal ligation 1959  . Hysteroscopy   . Dilation and curettage of uterus     Family History  Problem Relation Age of Onset  . Cancer Mother     bone  . Heart disease Brother   . Diabetes Brother   . Heart disease Brother   . Diabetes Brother     Allergies  Allergen Reactions  . Procaine Hcl     REACTION: heart palpitationbs    Current Outpatient Prescriptions on File Prior to Visit  Medication Sig Dispense Refill  . aspirin 81 MG tablet Take 81 mg by mouth. Take three times weekly       . calcium carbonate (TUMS) 500 MG chewable tablet Chew 3 tablets by mouth daily.          . cholecalciferol (VITAMIN D) 1000 UNITS tablet Take 1,000 Units by mouth daily.        . fluticasone (FLONASE) 50 MCG/ACT nasal spray 2 sprays by Nasal route daily.  16 g  11  . glucosamine-chondroitin 500-400 MG tablet Take 1 tablet by mouth 2 (two) times daily.        Marland Kitchen levothyroxine (SYNTHROID, LEVOTHROID) 100 MCG tablet Take 1 tablet (100 mcg total) by mouth daily.  90 tablet  3  . lisinopril (PRINIVIL,ZESTRIL) 20 MG tablet Take 1 tablet (20 mg total) by mouth daily.  90 tablet  3  . metoprolol (LOPRESSOR) 50 MG tablet TAKE ONE TABLET BY MOUTH TWICE DAILY  30 tablet  5  . multivitamin (THERAGRAN) per tablet Take 1 tablet by mouth daily.        . RABEprazole (ACIPHEX) 20 MG tablet Take 1 tablet (20 mg total) by mouth daily.  90 tablet  3  . rosuvastatin (CRESTOR) 20 MG tablet Take 1.5 tablets (30 mg total) by mouth every other day.  90 tablet  3  . triamcinolone (KENALOG) 0.1 % cream Apply topically  as directed.           Review of Systems      patient denies chest pain, shortness of breath, orthopnea. Denies lower extremity edema, abdominal pain, change in appetite, change in bowel movements. Patient denies rashes, musculoskeletal complaints. No other specific complaints in a complete review of systems.   Objective:   Physical Exam   BP 166/82  Pulse 96  Temp(Src) 98.6 F (37 C) (Oral)  Wt 147 lb (66.679 kg)  Well-developed well-nourished female in no acute distress. HEENT exam atraumatic, normocephalic, extraocular muscles are intact. Neck is supple. No jugular venous distention no thyromegaly. Chest clear to auscultation without increased work of breathing. Cardiac exam S1 and S2 are regular. Abdominal exam active bowel sounds, soft, nontender. Extremities no edema.       Assessment & Plan:  URI -sxs resolving (no treatment indicated)

## 2011-06-20 NOTE — Assessment & Plan Note (Signed)
Lab Results  Component Value Date   TSH 0.99 10/05/2010   Check labs today

## 2011-06-20 NOTE — Assessment & Plan Note (Signed)
BP Readings from Last 3 Encounters:  06/20/11 166/82  06/10/11 128/74  04/06/11 110/70   Repeat BP 140/75 i've asked her to monitor at home

## 2011-06-20 NOTE — Assessment & Plan Note (Signed)
Lab Results  Component Value Date   CHOL 167 10/05/2010   HDL 49.70 10/05/2010   LDLCALC 91 10/05/2010   TRIG 132.0 10/05/2010   CHOLHDL 3 10/05/2010  check labs today (non fasting)

## 2011-07-15 DIAGNOSIS — J309 Allergic rhinitis, unspecified: Secondary | ICD-10-CM | POA: Diagnosis not present

## 2011-07-26 DIAGNOSIS — J309 Allergic rhinitis, unspecified: Secondary | ICD-10-CM | POA: Diagnosis not present

## 2011-08-01 ENCOUNTER — Other Ambulatory Visit: Payer: Self-pay | Admitting: Internal Medicine

## 2011-08-01 DIAGNOSIS — E039 Hypothyroidism, unspecified: Secondary | ICD-10-CM

## 2011-08-01 DIAGNOSIS — J309 Allergic rhinitis, unspecified: Secondary | ICD-10-CM | POA: Diagnosis not present

## 2011-08-01 MED ORDER — LEVOTHYROXINE SODIUM 100 MCG PO TABS: 100.0000 ug | ORAL_TABLET | Freq: Every day | ORAL | Status: DC

## 2011-08-01 MED ORDER — METOPROLOL TARTRATE 50 MG PO TABS: 50.0000 mg | ORAL_TABLET | Freq: Two times a day (BID) | ORAL | Status: DC

## 2011-08-01 NOTE — Telephone Encounter (Signed)
Pt came by office and said that she needs a 21 day supply of levothyroxine (SYNTHROID, LEVOTHROID) 100 MCG tablet and a 40 day supply of metoprolol (LOPRESSOR) 50 MG tablet called in to local [facility] on [address]. Pt says that this will last until the mail order that she ordered will arrive.

## 2011-08-01 NOTE — Telephone Encounter (Signed)
rx sent in electronically 

## 2011-08-08 DIAGNOSIS — J309 Allergic rhinitis, unspecified: Secondary | ICD-10-CM | POA: Diagnosis not present

## 2011-08-10 DIAGNOSIS — L570 Actinic keratosis: Secondary | ICD-10-CM | POA: Diagnosis not present

## 2011-08-10 DIAGNOSIS — Z85828 Personal history of other malignant neoplasm of skin: Secondary | ICD-10-CM | POA: Diagnosis not present

## 2011-08-10 DIAGNOSIS — D239 Other benign neoplasm of skin, unspecified: Secondary | ICD-10-CM | POA: Diagnosis not present

## 2011-08-10 DIAGNOSIS — R609 Edema, unspecified: Secondary | ICD-10-CM | POA: Diagnosis not present

## 2011-08-10 DIAGNOSIS — C44611 Basal cell carcinoma of skin of unspecified upper limb, including shoulder: Secondary | ICD-10-CM | POA: Diagnosis not present

## 2011-08-10 DIAGNOSIS — L821 Other seborrheic keratosis: Secondary | ICD-10-CM | POA: Diagnosis not present

## 2011-08-10 DIAGNOSIS — L819 Disorder of pigmentation, unspecified: Secondary | ICD-10-CM | POA: Diagnosis not present

## 2011-08-10 DIAGNOSIS — D485 Neoplasm of uncertain behavior of skin: Secondary | ICD-10-CM | POA: Diagnosis not present

## 2011-08-10 DIAGNOSIS — D1801 Hemangioma of skin and subcutaneous tissue: Secondary | ICD-10-CM | POA: Diagnosis not present

## 2011-08-16 DIAGNOSIS — M19049 Primary osteoarthritis, unspecified hand: Secondary | ICD-10-CM | POA: Diagnosis not present

## 2011-08-16 DIAGNOSIS — M159 Polyosteoarthritis, unspecified: Secondary | ICD-10-CM | POA: Diagnosis not present

## 2011-08-16 DIAGNOSIS — M25549 Pain in joints of unspecified hand: Secondary | ICD-10-CM | POA: Diagnosis not present

## 2011-08-26 ENCOUNTER — Telehealth: Payer: Self-pay | Admitting: Internal Medicine

## 2011-08-26 DIAGNOSIS — K219 Gastro-esophageal reflux disease without esophagitis: Secondary | ICD-10-CM

## 2011-08-26 DIAGNOSIS — J309 Allergic rhinitis, unspecified: Secondary | ICD-10-CM | POA: Diagnosis not present

## 2011-08-26 DIAGNOSIS — E785 Hyperlipidemia, unspecified: Secondary | ICD-10-CM

## 2011-08-26 MED ORDER — ROSUVASTATIN CALCIUM 20 MG PO TABS: 30.0000 mg | ORAL_TABLET | ORAL | Status: DC

## 2011-08-26 MED ORDER — RABEPRAZOLE SODIUM 20 MG PO TBEC: 20.0000 mg | DELAYED_RELEASE_TABLET | Freq: Every day | ORAL | Status: DC

## 2011-08-26 NOTE — Telephone Encounter (Signed)
Pt came by and said that she is going to be going out of town and will be needing refills of the following meds:rosuvastatin (CRESTOR) 20 MG tablet , RABEprazole (ACIPHEX) 20 MG tablet to [facility] on [address].  Pt req to notified when this has been done. Pls call in 1 month supply.

## 2011-08-26 NOTE — Telephone Encounter (Signed)
Rx sent to pharmacy.  Called pt to make aware.  Left a message for pt to return call.

## 2011-08-29 DIAGNOSIS — J309 Allergic rhinitis, unspecified: Secondary | ICD-10-CM | POA: Diagnosis not present

## 2011-08-31 DIAGNOSIS — C44611 Basal cell carcinoma of skin of unspecified upper limb, including shoulder: Secondary | ICD-10-CM | POA: Diagnosis not present

## 2011-10-10 DIAGNOSIS — J309 Allergic rhinitis, unspecified: Secondary | ICD-10-CM | POA: Diagnosis not present

## 2011-10-13 DIAGNOSIS — J309 Allergic rhinitis, unspecified: Secondary | ICD-10-CM | POA: Diagnosis not present

## 2011-10-21 DIAGNOSIS — J309 Allergic rhinitis, unspecified: Secondary | ICD-10-CM | POA: Diagnosis not present

## 2011-10-25 DIAGNOSIS — J309 Allergic rhinitis, unspecified: Secondary | ICD-10-CM | POA: Diagnosis not present

## 2011-11-02 DIAGNOSIS — J309 Allergic rhinitis, unspecified: Secondary | ICD-10-CM | POA: Diagnosis not present

## 2011-11-14 DIAGNOSIS — J309 Allergic rhinitis, unspecified: Secondary | ICD-10-CM | POA: Diagnosis not present

## 2011-11-29 ENCOUNTER — Other Ambulatory Visit: Payer: Self-pay | Admitting: *Deleted

## 2011-11-29 DIAGNOSIS — K219 Gastro-esophageal reflux disease without esophagitis: Secondary | ICD-10-CM

## 2011-11-29 DIAGNOSIS — E039 Hypothyroidism, unspecified: Secondary | ICD-10-CM

## 2011-11-29 DIAGNOSIS — E785 Hyperlipidemia, unspecified: Secondary | ICD-10-CM

## 2011-11-29 MED ORDER — ROSUVASTATIN CALCIUM 20 MG PO TABS: 30.0000 mg | ORAL_TABLET | ORAL | Status: DC

## 2011-11-29 MED ORDER — RABEPRAZOLE SODIUM 20 MG PO TBEC: 20.0000 mg | DELAYED_RELEASE_TABLET | Freq: Every day | ORAL | Status: DC

## 2011-11-29 MED ORDER — LEVOTHYROXINE SODIUM 100 MCG PO TABS: 100.0000 ug | ORAL_TABLET | Freq: Every day | ORAL | Status: DC

## 2011-11-29 MED ORDER — METOPROLOL TARTRATE 50 MG PO TABS: 50.0000 mg | ORAL_TABLET | Freq: Two times a day (BID) | ORAL | Status: DC

## 2011-11-30 DIAGNOSIS — J309 Allergic rhinitis, unspecified: Secondary | ICD-10-CM | POA: Diagnosis not present

## 2011-12-02 ENCOUNTER — Ambulatory Visit (INDEPENDENT_AMBULATORY_CARE_PROVIDER_SITE_OTHER): Payer: Medicare Other | Admitting: Internal Medicine

## 2011-12-02 ENCOUNTER — Encounter: Payer: Self-pay | Admitting: Internal Medicine

## 2011-12-02 VITALS — BP 134/76 | HR 96 | Temp 98.4°F | Wt 144.0 lb

## 2011-12-02 DIAGNOSIS — R413 Other amnesia: Secondary | ICD-10-CM

## 2011-12-02 LAB — TSH: TSH: 2.73 u[IU]/mL (ref 0.35–5.50)

## 2011-12-02 LAB — VITAMIN B12: Vitamin B-12: 451 pg/mL (ref 211–911)

## 2011-12-02 NOTE — Progress Notes (Signed)
Patient ID: Robin Ferguson, female   DOB: 1922/08/16, 78 y.o.   MRN: [id number]  Comes in with daughter to discuss memory issues. They really describe occasional word finding difficulties. She did have one episode where she couldn't figure out how to use her credit card at a gas station. Also tried to get in wrong car.  Meds and pmhx reviewed Quick mmse: she did not know date of [place], thought today was Monday. Memory 1/3.  A/P-- she has some word finding difficulties Some of her sxs were acute in their onset,  Some of her sxs may be age related.

## 2011-12-08 ENCOUNTER — Ambulatory Visit (INDEPENDENT_AMBULATORY_CARE_PROVIDER_SITE_OTHER)
Admission: RE | Admit: 2011-12-08 | Discharge: 2011-12-08 | Disposition: A | Discharge status: Home / Self Care | Payer: Medicare Other | Source: Ambulatory Visit | Attending: Internal Medicine | Admitting: Internal Medicine

## 2011-12-08 DIAGNOSIS — R413 Other amnesia: Secondary | ICD-10-CM | POA: Diagnosis not present

## 2011-12-08 DIAGNOSIS — G319 Degenerative disease of nervous system, unspecified: Secondary | ICD-10-CM | POA: Diagnosis not present

## 2011-12-14 DIAGNOSIS — J309 Allergic rhinitis, unspecified: Secondary | ICD-10-CM | POA: Diagnosis not present

## 2011-12-14 DIAGNOSIS — Z1231 Encounter for screening mammogram for malignant neoplasm of breast: Secondary | ICD-10-CM | POA: Diagnosis not present

## 2011-12-16 DIAGNOSIS — J309 Allergic rhinitis, unspecified: Secondary | ICD-10-CM | POA: Diagnosis not present

## 2011-12-19 ENCOUNTER — Encounter: Payer: Self-pay | Admitting: Obstetrics and Gynecology

## 2011-12-20 DIAGNOSIS — J309 Allergic rhinitis, unspecified: Secondary | ICD-10-CM | POA: Diagnosis not present

## 2011-12-23 DIAGNOSIS — J309 Allergic rhinitis, unspecified: Secondary | ICD-10-CM | POA: Diagnosis not present

## 2011-12-26 ENCOUNTER — Encounter: Payer: Self-pay | Admitting: Internal Medicine

## 2011-12-26 ENCOUNTER — Ambulatory Visit (INDEPENDENT_AMBULATORY_CARE_PROVIDER_SITE_OTHER): Payer: Medicare Other | Admitting: Internal Medicine

## 2011-12-26 VITALS — BP 156/94 | HR 84 | Temp 98.2°F | Wt 146.0 lb

## 2011-12-26 DIAGNOSIS — R413 Other amnesia: Secondary | ICD-10-CM | POA: Diagnosis not present

## 2011-12-26 MED ORDER — DONEPEZIL HCL 10 MG PO TABS: 10.0000 mg | ORAL_TABLET | Freq: Every day | ORAL | Status: DC

## 2011-12-26 NOTE — Patient Instructions (Signed)
aricept (doneprezil) 10 mg . Take 1/2 pill daily for 30 days and then increase to 1 pill daily.

## 2011-12-26 NOTE — Assessment & Plan Note (Signed)
Trial aricept 5 mg po qd for 30 days and then increase to 10 mg.

## 2011-12-26 NOTE — Progress Notes (Signed)
Patient ID: Robin Ferguson, female   DOB: 1923/02/17, 78 y.o.   MRN: [id number]  Memory loss- discussed with dtr and patient Reviewed labs and imaging

## 2012-01-04 DIAGNOSIS — J309 Allergic rhinitis, unspecified: Secondary | ICD-10-CM | POA: Diagnosis not present

## 2012-01-11 DIAGNOSIS — J309 Allergic rhinitis, unspecified: Secondary | ICD-10-CM | POA: Diagnosis not present

## 2012-01-24 ENCOUNTER — Ambulatory Visit (INDEPENDENT_AMBULATORY_CARE_PROVIDER_SITE_OTHER): Payer: Medicare Other | Admitting: Internal Medicine

## 2012-01-24 ENCOUNTER — Other Ambulatory Visit: Payer: Self-pay | Admitting: Internal Medicine

## 2012-01-24 ENCOUNTER — Encounter: Payer: Self-pay | Admitting: Internal Medicine

## 2012-01-24 VITALS — BP 166/80 | HR 62 | Temp 99.1°F | Wt 147.0 lb

## 2012-01-24 DIAGNOSIS — J309 Allergic rhinitis, unspecified: Secondary | ICD-10-CM | POA: Diagnosis not present

## 2012-01-24 DIAGNOSIS — R21 Rash and other nonspecific skin eruption: Secondary | ICD-10-CM

## 2012-01-24 DIAGNOSIS — L0100 Impetigo, unspecified: Secondary | ICD-10-CM

## 2012-01-24 MED ORDER — CEPHALEXIN 500 MG PO CAPS: 500.0000 mg | ORAL_CAPSULE | Freq: Two times a day (BID) | ORAL | Status: AC

## 2012-01-24 NOTE — Patient Instructions (Signed)
Uncertain cause of this lesion but it could be an infection of the skin such as impetigo.  Can use Polysporin antibiotic ointment 2 times a day for up to a week  Begin antibiotic and will contact you with the results of culture test.  Contact us in the meantime if it is spreading or other concerns I would expect either way it to be improving within a week.

## 2012-01-24 NOTE — Progress Notes (Signed)
  Subjective:    Patient ID: Robin Ferguson, female    DOB: Jan 25, 1923, 78 y.o.   MRN: [id number]  HPI Patient comes in today for SDA for  new problem evaluation. PCP out of office  Onset of a papule white bump on the left cheek about a week or so ago she scratched it and then [place] the beach [place]. The bumps have since spread and become crusted. It itches more than hurts. It was very swollen this week a little bit better today she is but no medicine on it except moisturizer.  No history of other new rashes. Remote history of cold sores but none recently Review of Systems Negative fever sore throat swollen glands vision changes. `Past history family history social history reviewed in the electronic medical record. meds reviewed     Objective:   Physical Exam  BP 166/80  Pulse 62  Temp 99.1 F (37.3 C) (Oral)  Wt 147 lb (66.679 kg)  SpO2 96% Well-developed well-nourished in no acute distress normal speech and attention. Face with some rosacea like erythema bilaterally with no papules There is a 3 cm area with crusted papules gold in color non-confluent without vesicle or blister. This is on her left cheek with redness. A surface culture of the crusting was obtained no pustules to unroofed. OP shows no lesions or edema Neck supple without adenopathy. Nares is clear     Assessment & Plan:   Face rash left cheek reminiscent of impetigo Examined and no evidence of vesicles such as herpetic-like. Not typical for contact dermatitis either.  Treat for typical impetigo monitor her followup if not improving topical antibiotic and oral Keflex at this time. Expectant management and followup

## 2012-01-27 LAB — WOUND CULTURE: Gram Stain: NONE SEEN

## 2012-02-01 DIAGNOSIS — L03211 Cellulitis of face: Secondary | ICD-10-CM | POA: Diagnosis not present

## 2012-02-01 DIAGNOSIS — L0201 Cutaneous abscess of face: Secondary | ICD-10-CM | POA: Diagnosis not present

## 2012-02-02 DIAGNOSIS — J309 Allergic rhinitis, unspecified: Secondary | ICD-10-CM | POA: Diagnosis not present

## 2012-02-04 ENCOUNTER — Other Ambulatory Visit: Payer: Self-pay | Admitting: Internal Medicine

## 2012-02-08 DIAGNOSIS — J309 Allergic rhinitis, unspecified: Secondary | ICD-10-CM | POA: Diagnosis not present

## 2012-02-14 DIAGNOSIS — J309 Allergic rhinitis, unspecified: Secondary | ICD-10-CM | POA: Diagnosis not present

## 2012-02-23 DIAGNOSIS — Z043 Encounter for examination and observation following other accident: Secondary | ICD-10-CM | POA: Diagnosis not present

## 2012-02-23 DIAGNOSIS — M159 Polyosteoarthritis, unspecified: Secondary | ICD-10-CM | POA: Diagnosis not present

## 2012-02-23 DIAGNOSIS — M545 Low back pain: Secondary | ICD-10-CM | POA: Diagnosis not present

## 2012-02-27 ENCOUNTER — Encounter: Payer: Self-pay | Admitting: Internal Medicine

## 2012-02-27 ENCOUNTER — Ambulatory Visit (INDEPENDENT_AMBULATORY_CARE_PROVIDER_SITE_OTHER): Payer: Medicare Other | Admitting: Internal Medicine

## 2012-02-27 VITALS — BP 134/80 | Temp 98.1°F | Wt 141.0 lb

## 2012-02-27 DIAGNOSIS — R413 Other amnesia: Secondary | ICD-10-CM | POA: Diagnosis not present

## 2012-02-28 DIAGNOSIS — J309 Allergic rhinitis, unspecified: Secondary | ICD-10-CM | POA: Diagnosis not present

## 2012-02-28 NOTE — Assessment & Plan Note (Signed)
It's unclear to me whether she has typical age-related memory loss or whether she has a form of dementia. I think it's best to continue Aricept at this time. She will followup with me in 6 months.

## 2012-02-28 NOTE — Progress Notes (Signed)
Patient ID: Robin Ferguson, female   DOB: 1922/10/13, 78 y.o.   MRN: [id number] Patient comes in for followup. The last time I saw the patient we started her on Aricept for mild to moderate memory loss. She is feeling well. She thinks that she is doing better from her memory point of view. Her daughter thinks she is about stable. They both know that Aricept and medications like Aricept do not typically improve memory.  I have reviewed past medical history medications.  Physical exam: She appears well. She is able to dis cuss a complicated medication regimen and discussion. She understands all of her medications and when she takes them.

## 2012-03-07 DIAGNOSIS — J309 Allergic rhinitis, unspecified: Secondary | ICD-10-CM | POA: Diagnosis not present

## 2012-03-14 DIAGNOSIS — H04129 Dry eye syndrome of unspecified lacrimal gland: Secondary | ICD-10-CM | POA: Diagnosis not present

## 2012-03-19 DIAGNOSIS — J309 Allergic rhinitis, unspecified: Secondary | ICD-10-CM | POA: Diagnosis not present

## 2012-03-20 ENCOUNTER — Telehealth: Payer: Self-pay | Admitting: Internal Medicine

## 2012-03-20 DIAGNOSIS — I10 Essential (primary) hypertension: Secondary | ICD-10-CM

## 2012-03-20 NOTE — Telephone Encounter (Signed)
Patient came in stating that she need a refill of her lisinopril sent to [facility]. Patient is completely out. Please assist.

## 2012-03-21 MED ORDER — LISINOPRIL 20 MG PO TABS: 20.0000 mg | ORAL_TABLET | Freq: Every day | ORAL | Status: DC

## 2012-03-21 NOTE — Telephone Encounter (Signed)
rx sent in electronically to [facility] [address] per pt request

## 2012-03-30 DIAGNOSIS — J309 Allergic rhinitis, unspecified: Secondary | ICD-10-CM | POA: Diagnosis not present

## 2012-04-02 ENCOUNTER — Other Ambulatory Visit: Payer: Self-pay | Admitting: *Deleted

## 2012-04-02 DIAGNOSIS — R413 Other amnesia: Secondary | ICD-10-CM

## 2012-04-02 DIAGNOSIS — E039 Hypothyroidism, unspecified: Secondary | ICD-10-CM

## 2012-04-02 DIAGNOSIS — E785 Hyperlipidemia, unspecified: Secondary | ICD-10-CM

## 2012-04-02 DIAGNOSIS — J309 Allergic rhinitis, unspecified: Secondary | ICD-10-CM | POA: Diagnosis not present

## 2012-04-02 MED ORDER — ROSUVASTATIN CALCIUM 20 MG PO TABS: 30.0000 mg | ORAL_TABLET | ORAL | Status: DC

## 2012-04-02 MED ORDER — METOPROLOL TARTRATE 50 MG PO TABS: 50.0000 mg | ORAL_TABLET | Freq: Two times a day (BID) | ORAL | Status: DC

## 2012-04-02 MED ORDER — LEVOTHYROXINE SODIUM 100 MCG PO TABS: 100.0000 ug | ORAL_TABLET | Freq: Every day | ORAL | Status: DC

## 2012-04-06 ENCOUNTER — Encounter: Payer: Medicare Other | Admitting: Obstetrics and Gynecology

## 2012-04-17 ENCOUNTER — Encounter: Payer: Self-pay | Admitting: Obstetrics and Gynecology

## 2012-04-17 ENCOUNTER — Ambulatory Visit (INDEPENDENT_AMBULATORY_CARE_PROVIDER_SITE_OTHER): Payer: Medicare Other | Admitting: Obstetrics and Gynecology

## 2012-04-17 VITALS — BP 126/80 | Ht 61.25 in | Wt 144.0 lb

## 2012-04-17 DIAGNOSIS — N952 Postmenopausal atrophic vaginitis: Secondary | ICD-10-CM

## 2012-04-17 DIAGNOSIS — M899 Disorder of bone, unspecified: Secondary | ICD-10-CM | POA: Diagnosis not present

## 2012-04-17 DIAGNOSIS — N949 Unspecified condition associated with female genital organs and menstrual cycle: Secondary | ICD-10-CM

## 2012-04-17 DIAGNOSIS — M949 Disorder of cartilage, unspecified: Secondary | ICD-10-CM

## 2012-04-17 DIAGNOSIS — R102 Pelvic and perineal pain: Secondary | ICD-10-CM

## 2012-04-17 DIAGNOSIS — R32 Unspecified urinary incontinence: Secondary | ICD-10-CM | POA: Diagnosis not present

## 2012-04-17 DIAGNOSIS — M8580 Other specified disorders of bone density and structure, unspecified site: Secondary | ICD-10-CM

## 2012-04-17 NOTE — Progress Notes (Signed)
Patient came to see me today for further followup. She does have osteopenia. She does not have an elevated fracture risk. Comparison of the last 2 bone densities shows statistically significant decrease in bone mineral density at hip. She has not had any fractures. She continues to have some urinary urgency. She rarely has an accident because she gets to the bathroom quickly. She does not feel the need for medication. She has no dysuria. She is up-to-date on mammograms. She's never had an abnormal Pap smear. Her last Pap smear was 2012. She is having no vaginal bleeding. She is having no pelvic pain. She does have vaginal dryness but does not feel it requires intervention. She's been having intermittent pelvic pain. Last her ultrasound was normal and the pain is actually decreased. She is not having nausea, vomiting or change in bowel habits.  ROS: 12 system review done. Pertinent positives above.other positives include hypothyroidism, hypertension, hyperlipidemia, left bundle-branch block, impetigo and diverticulosis.  Physical examination:Robin Ferguson present. HEENT within normal limits. Neck: Thyroid not large. No masses. Supraclavicular nodes: not enlarged. Breasts: Examined in both sitting and lying  position. No skin changes and no masses. Abdomen: Soft no guarding rebound or masses or hernia. Pelvic: External: Within normal limits. BUS: Within normal limits. Vaginal:within normal limits. Fair  estrogen effect. No evidence of cystocele rectocele or enterocele. Cervix: clean. Uterus: Normal size and shape. Adnexa: No masses. Rectovaginal exam: Confirmatory and negative. Extremities: Within normal limits.  Assessment: #1. Atrophic vaginitis #2. Osteopenia #3. Urinary urgency and occasional incontinence #4. Pelvic discomfort now better  Plan: Continue yearly mammograms. Bone density next year. Observation of other symptoms. Pap not done.The new Pap smear guidelines were discussed with the patient.

## 2012-04-17 NOTE — Patient Instructions (Signed)
Have a bone density next year.

## 2012-04-18 DIAGNOSIS — J309 Allergic rhinitis, unspecified: Secondary | ICD-10-CM | POA: Diagnosis not present

## 2012-04-18 LAB — URINALYSIS W MICROSCOPIC + REFLEX CULTURE
Bilirubin Urine: NEGATIVE
Casts: NONE SEEN
Glucose, UA: NEGATIVE mg/dL
Hgb urine dipstick: NEGATIVE
Ketones, ur: NEGATIVE mg/dL
Protein, ur: NEGATIVE mg/dL
pH: 6 (ref 5.0–8.0)

## 2012-04-23 DIAGNOSIS — J309 Allergic rhinitis, unspecified: Secondary | ICD-10-CM | POA: Diagnosis not present

## 2012-04-25 DIAGNOSIS — J3089 Other allergic rhinitis: Secondary | ICD-10-CM | POA: Diagnosis not present

## 2012-04-25 DIAGNOSIS — J301 Allergic rhinitis due to pollen: Secondary | ICD-10-CM | POA: Diagnosis not present

## 2012-04-26 DIAGNOSIS — L570 Actinic keratosis: Secondary | ICD-10-CM | POA: Diagnosis not present

## 2012-04-26 DIAGNOSIS — C44721 Squamous cell carcinoma of skin of unspecified lower limb, including hip: Secondary | ICD-10-CM | POA: Diagnosis not present

## 2012-04-26 DIAGNOSIS — C44611 Basal cell carcinoma of skin of unspecified upper limb, including shoulder: Secondary | ICD-10-CM | POA: Diagnosis not present

## 2012-04-26 DIAGNOSIS — D485 Neoplasm of uncertain behavior of skin: Secondary | ICD-10-CM | POA: Diagnosis not present

## 2012-04-26 DIAGNOSIS — Z85828 Personal history of other malignant neoplasm of skin: Secondary | ICD-10-CM | POA: Diagnosis not present

## 2012-05-01 ENCOUNTER — Encounter: Payer: Self-pay | Admitting: Family Medicine

## 2012-05-01 ENCOUNTER — Ambulatory Visit (INDEPENDENT_AMBULATORY_CARE_PROVIDER_SITE_OTHER): Payer: Medicare Other | Admitting: Family Medicine

## 2012-05-01 ENCOUNTER — Telehealth: Payer: Self-pay | Admitting: Internal Medicine

## 2012-05-01 VITALS — BP 140/80 | HR 66 | Temp 98.2°F | Resp 16 | Wt 143.0 lb

## 2012-05-01 DIAGNOSIS — I10 Essential (primary) hypertension: Secondary | ICD-10-CM

## 2012-05-01 DIAGNOSIS — R001 Bradycardia, unspecified: Secondary | ICD-10-CM

## 2012-05-01 DIAGNOSIS — I498 Other specified cardiac arrhythmias: Secondary | ICD-10-CM | POA: Diagnosis not present

## 2012-05-01 DIAGNOSIS — J309 Allergic rhinitis, unspecified: Secondary | ICD-10-CM | POA: Diagnosis not present

## 2012-05-01 DIAGNOSIS — J329 Chronic sinusitis, unspecified: Secondary | ICD-10-CM | POA: Diagnosis not present

## 2012-05-01 MED ORDER — FLUTICASONE PROPIONATE 50 MCG/ACT NA SUSP: 2.0000 | Freq: Every day | NASAL | Status: DC

## 2012-05-01 MED ORDER — METOPROLOL TARTRATE 50 MG PO TABS: 25.0000 mg | ORAL_TABLET | Freq: Two times a day (BID) | ORAL | Status: DC

## 2012-05-01 MED ORDER — AMOXICILLIN 500 MG PO CAPS: 500.0000 mg | ORAL_CAPSULE | Freq: Three times a day (TID) | ORAL | Status: DC

## 2012-05-01 NOTE — Patient Instructions (Signed)
For Hypertension: -decrease metoprolol to 25 mg twice daily as your heart rate is a little slow and follow up with your doctor. See your doctor sooner if any chest pain, dizziness, falls, trouble breathing or other concerns.   INSTRUCTIONS FOR UPPER RESPIRATORY INFECTION:  -plenty of rest and fluids  -nasal saline wash 2-3 times daily (use prepackaged nasal saline or bottled/distilled water if making your own)   -clean nose with nasal saline before using the nasal steroid   -in the winter time, using a humidifier at night is helpful (please follow cleaning instructions)  -if you are taking a cough medication - use only as directed, may also try a teaspoon of honey to coat the throat and throat lozenges  -for sore throat, salt water gargles can help  -follow up if you have fevers, are worsening or not getting better in 2-3 days

## 2012-05-01 NOTE — Progress Notes (Signed)
Chief Complaint  Patient presents with  . Shortness of Breath  . Nasal Congestion    HPI:  -started: last few months with AR and saw PCP - started on Patanase about one week -worse this morning and over last few weeks -symptoms:nasal congestion, sore throat, cough, malaise, mild SOB and dizziness this morning for a few seconds when stood up then none since, congestion in throat  -denies:fever, CP, NVD, tooth pain, strep or mono exposure, dizziness (a little dizzy when first stood up this am) -has tried: patanase from dermatologist -sick contacts: none known -Hx of: asthma as a child - none since, AR  ROS: See pertinent positives and negatives per HPI.  Past Medical History  Diagnosis Date  . Allergy     rhinitis  . Asthma   . Hypertension   . Hypothyroidism   . Hyperlipidemia   . Hx of adenomatous colonic polyps   . Diverticulosis   . Esophageal stricture   . Hemorrhoids   . PMB (postmenopausal bleeding)   . Tachycardia   . Cancer     skin- right shoulder-Squamous cell-legs  . Osteopenia     Family History  Problem Relation Age of Onset  . Cancer Mother     bone  . Heart disease Brother   . Diabetes Brother   . Heart disease Brother   . Diabetes Brother     History   Social History  . Marital Status: Widowed    Spouse Name: N/A    Number of Children: N/A  . Years of Education: N/A   Social History Main Topics  . Smoking status: Former Games developer  . Smokeless tobacco: None  . Alcohol Use: Yes     occas  . Drug Use: None  . Sexually Active: No   Other Topics Concern  . None   Social History Narrative  . None    Current outpatient prescriptions:aspirin 81 MG tablet, Take 81 mg by mouth. Take three times weekly , Disp: , Rfl: ;  calcium carbonate (TUMS) 500 MG chewable tablet, Chew 3 tablets by mouth daily.  , Disp: , Rfl: ;  cholecalciferol (VITAMIN D) 1000 UNITS tablet, Take 1,000 Units by mouth daily.  , Disp: , Rfl: ;  donepezil (ARICEPT) 10 MG  tablet, Take 1 tablet (10 mg total) by mouth at bedtime., Disp: 90 tablet, Rfl: 3 fluticasone (FLONASE) 50 MCG/ACT nasal spray, Place 2 sprays into the nose daily., Disp: 16 g, Rfl: 3;  glucosamine-chondroitin 500-400 MG tablet, Take 1 tablet by mouth daily. , Disp: , Rfl: ;  levothyroxine (SYNTHROID, LEVOTHROID) 100 MCG tablet, Take 1 tablet (100 mcg total) by mouth daily., Disp: 90 tablet, Rfl: 1;  lisinopril (PRINIVIL,ZESTRIL) 20 MG tablet, Take 1 tablet (20 mg total) by mouth daily., Disp: 90 tablet, Rfl: 3 loratadine (ALLERGY RELIEF) 10 MG tablet, Take 10 mg by mouth daily.  , Disp: , Rfl: ;  metoprolol (LOPRESSOR) 50 MG tablet, Take 0.5 tablets (25 mg total) by mouth 2 (two) times daily., Disp: 30 tablet, Rfl: 1;  RABEprazole (ACIPHEX) 20 MG tablet, Take 1 tablet (20 mg total) by mouth daily., Disp: 90 tablet, Rfl: 1;  rosuvastatin (CRESTOR) 20 MG tablet, Take 1.5 tablets (30 mg total) by mouth every other day., Disp: 90 tablet, Rfl: 1 DISCONTD: fluticasone (FLONASE) 50 MCG/ACT nasal spray, 2 sprays by Nasal route daily., Disp: 16 g, Rfl: 11;  DISCONTD: metoprolol (LOPRESSOR) 50 MG tablet, Take 1 tablet (50 mg total) by mouth 2 (two) times daily.,  Disp: 180 tablet, Rfl: 1;  amoxicillin (AMOXIL) 500 MG capsule, Take 1 capsule (500 mg total) by mouth 3 (three) times daily., Disp: 30 capsule, Rfl: 0  EXAM:  Filed Vitals:   05/01/12 1140  BP: 140/80  Pulse: 66  Temp:   Resp:     There is no height on file to calculate BMI.  GENERAL: vitals reviewed and listed above, alert, oriented, appears well hydrated and in no acute distress  HEENT: atraumatic, conjunttiva clear, no obvious abnormalities on inspection of external nose and ears, normal appearance of ear canals and TMs, clear nasal congestion, mild post oropharyngeal erythema with PND, no tonsillar edema or exudate, no sinus TTP  NECK: no obvious masses on inspection  LUNGS: clear to auscultation bilaterally, no wheezes, rales or rhonchi,  good air movement  CV: HRR - brady mild, no peripheral edema  MS: moves all extremities without noticeable abnormality  PSYCH: pleasant and cooperative, no obvious depression or anxiety  ASSESSMENT AND PLAN:  Discussed the following assessment and plan:  1. Allergic rhinitis  fluticasone (FLONASE) 50 MCG/ACT nasal spray  2. Sinusitis  amoxicillin (AMOXIL) 500 MG capsule  3. Bradycardia  EKG 12-Lead, metoprolol (LOPRESSOR) 50 MG tablet  4. HYPERTENSION  metoprolol (LOPRESSOR) 50 MG tablet   -mild bradycardia seems new per ROC, currently asymptomatic, EKG shows - no heart block - but old BBB, on BB - orthostatic neg  -decreased BB to 25 mg bid and pt will follow up with PCP per instructions or sooner if any recurrence of symptoms -Patient advised to return or notify a doctor immediately if symptoms worsen or persist or new concerns arise.  Patient Instructions  For Hypertension: -decrease metoprolol to 25 mg twice daily as your heart rate is a little slow and follow up with your doctor. See your doctor sooner if any chest pain, dizziness, falls, trouble breathing or other concerns.   INSTRUCTIONS FOR UPPER RESPIRATORY INFECTION:  -plenty of rest and fluids  -nasal saline wash 2-3 times daily (use prepackaged nasal saline or bottled/distilled water if making your own)   -clean nose with nasal saline before using the nasal steroid   -in the winter time, using a humidifier at night is helpful (please follow cleaning instructions)  -if you are taking a cough medication - use only as directed, may also try a teaspoon of honey to coat the throat and throat lozenges  -for sore throat, salt water gargles can help  -follow up if you have fevers, are worsening or not getting better in 2-3 days      Robin Ferguson, Dahlia Client R.

## 2012-05-01 NOTE — Telephone Encounter (Signed)
Caller: Robin Ferguson/Patient; Patient Name: Robin Ferguson; PCP: Birdie Sons (Adults only); Best Callback Phone Number: [phone]; Pt calling today 05/01/12 regarding she has not been feeling well because she has a sinus infection.  However when she woke this AM she felt like she could not breath well.  Said her pulse felt slow.  When she got up out of bed she felt dizzy.  Not feeling dizzy now, however says she just does not feel well.  No chest pain.  No severe shortness of breath. Afebrile.  Emergent symptoms r/o by Breathing Problems guidelines with exception of new or worsening breathing problems that not been evaluated.  (See Provider Immediately office is open).  Appt scheduled for today at 10:45 AM with Dr. Kriste Basque (no appts availble for Dr. Cato Mulligan).  Advised pt to call 911 if any chest pain or severe shortness of breath, pt verbalized understanding.

## 2012-05-05 ENCOUNTER — Emergency Department (HOSPITAL_COMMUNITY)
Admission: EM | Admit: 2012-05-05 | Discharge: 2012-05-06 | Disposition: A | Discharge status: Home / Self Care | Payer: Medicare Other | Attending: Emergency Medicine | Admitting: Emergency Medicine

## 2012-05-05 ENCOUNTER — Encounter (HOSPITAL_COMMUNITY): Payer: Self-pay | Admitting: *Deleted

## 2012-05-05 DIAGNOSIS — R42 Dizziness and giddiness: Secondary | ICD-10-CM | POA: Diagnosis not present

## 2012-05-05 DIAGNOSIS — Z85038 Personal history of other malignant neoplasm of large intestine: Secondary | ICD-10-CM | POA: Diagnosis not present

## 2012-05-05 DIAGNOSIS — M899 Disorder of bone, unspecified: Secondary | ICD-10-CM | POA: Diagnosis not present

## 2012-05-05 DIAGNOSIS — E782 Mixed hyperlipidemia: Secondary | ICD-10-CM | POA: Diagnosis not present

## 2012-05-05 DIAGNOSIS — I10 Essential (primary) hypertension: Secondary | ICD-10-CM | POA: Insufficient documentation

## 2012-05-05 DIAGNOSIS — K222 Esophageal obstruction: Secondary | ICD-10-CM | POA: Diagnosis not present

## 2012-05-05 DIAGNOSIS — Z79899 Other long term (current) drug therapy: Secondary | ICD-10-CM | POA: Diagnosis not present

## 2012-05-05 DIAGNOSIS — E039 Hypothyroidism, unspecified: Secondary | ICD-10-CM | POA: Insufficient documentation

## 2012-05-05 DIAGNOSIS — Z8719 Personal history of other diseases of the digestive system: Secondary | ICD-10-CM | POA: Insufficient documentation

## 2012-05-05 DIAGNOSIS — R000 Tachycardia, unspecified: Secondary | ICD-10-CM | POA: Insufficient documentation

## 2012-05-05 DIAGNOSIS — M949 Disorder of cartilage, unspecified: Secondary | ICD-10-CM | POA: Insufficient documentation

## 2012-05-05 DIAGNOSIS — H81399 Other peripheral vertigo, unspecified ear: Secondary | ICD-10-CM | POA: Insufficient documentation

## 2012-05-05 DIAGNOSIS — J45909 Unspecified asthma, uncomplicated: Secondary | ICD-10-CM | POA: Diagnosis not present

## 2012-05-05 DIAGNOSIS — Z7982 Long term (current) use of aspirin: Secondary | ICD-10-CM | POA: Diagnosis not present

## 2012-05-05 DIAGNOSIS — Z87891 Personal history of nicotine dependence: Secondary | ICD-10-CM | POA: Diagnosis not present

## 2012-05-05 DIAGNOSIS — Z85828 Personal history of other malignant neoplasm of skin: Secondary | ICD-10-CM | POA: Insufficient documentation

## 2012-05-05 DIAGNOSIS — J309 Allergic rhinitis, unspecified: Secondary | ICD-10-CM | POA: Diagnosis not present

## 2012-05-05 NOTE — ED Notes (Signed)
Patient is alert and oriented x3.  She is complaining of excessive sweating and tinnitus. On arrival at the ED she vomited.  She denies any pain or nausea.

## 2012-05-05 NOTE — ED Provider Notes (Addendum)
History     CSN: [id number]  Arrival date & time 05/05/12  2323   First MD Initiated Contact with Patient 05/05/12 2348      Chief Complaint  Patient presents with  . Tinnitus    (Consider location/radiation/quality/duration/timing/severity/associated sxs/prior treatment) HPI This is an 78 year old female who has been treated with amoxicillin this week for a respiratory infection. Specifically she has nasal congestion, sinus pressure and mild shortness of breath. The symptoms have been improving. This evening she had the sudden onset of buzzing in her ears associated with a feeling of being off balance, nausea and vomiting. It lasted less than an hour and resolved spontaneously. She still feels somewhat off balance. She has not had a fever. She denies chest pain. She denies a sensation of the room spinning.  Past Medical History  Diagnosis Date  . Allergy     rhinitis  . Asthma   . Hypertension   . Hypothyroidism   . Hyperlipidemia   . Hx of adenomatous colonic polyps   . Diverticulosis   . Esophageal stricture   . Hemorrhoids   . PMB (postmenopausal bleeding)   . Tachycardia   . Cancer     skin- right shoulder-Squamous cell-legs  . Osteopenia     Past Surgical History  Procedure Date  . Cataract extraction   . Nasal sinus surgery   . Shoulder surgery   . Tubal ligation 1959  . Hysteroscopy   . Dilation and curettage of uterus     Family History  Problem Relation Age of Onset  . Cancer Mother     bone  . Heart disease Brother   . Diabetes Brother   . Heart disease Brother   . Diabetes Brother     History  Substance Use Topics  . Smoking status: Former Games developer  . Smokeless tobacco: Not on file  . Alcohol Use: Yes     Comment: occas    OB History    Grav Para Term Preterm Abortions TAB SAB Ect Mult Living   4 4 4       4       Review of Systems  All other systems reviewed and are negative.    Allergies  Procaine hcl  Home Medications    Current Outpatient Rx  Name  Route  Sig  Dispense  Refill  . AMOXICILLIN 500 MG PO CAPS   Oral   Take 1 capsule (500 mg total) by mouth 3 (three) times daily.   30 capsule   0   . ASPIRIN 81 MG PO TABS   Oral   Take 81 mg by mouth every Monday, Wednesday, and Friday. Take three times weekly         . CALCIUM CARBONATE ANTACID 500 MG PO CHEW   Oral   Chew 1 tablet by mouth every Monday, Wednesday, and Friday.          Marland Kitchen VITAMIN D 1000 UNITS PO TABS   Oral   Take 1,000 Units by mouth daily.           . DONEPEZIL HCL 10 MG PO TABS   Oral   Take 1 tablet (10 mg total) by mouth at bedtime.   90 tablet   3   . FLUTICASONE PROPIONATE 50 MCG/ACT NA SUSP   Nasal   Place 2 sprays into the nose daily.   16 g   3   . GLUCOSAMINE-CHONDROITIN 500-400 MG PO TABS   Oral   Take 1  tablet by mouth daily.          Marland Kitchen LEVOTHYROXINE SODIUM 100 MCG PO TABS   Oral   Take 1 tablet (100 mcg total) by mouth daily.   90 tablet   1   . LISINOPRIL 20 MG PO TABS   Oral   Take 1 tablet (20 mg total) by mouth daily.   90 tablet   3   . LORATADINE 10 MG PO TABS   Oral   Take 10 mg by mouth daily.           Marland Kitchen METOPROLOL TARTRATE 50 MG PO TABS   Oral   Take 0.5 tablets (25 mg total) by mouth 2 (two) times daily.   30 tablet   1   . RABEPRAZOLE SODIUM 20 MG PO TBEC   Oral   Take 1 tablet (20 mg total) by mouth daily.   90 tablet   1   . ROSUVASTATIN CALCIUM 20 MG PO TABS   Oral   Take 1.5 tablets (30 mg total) by mouth every other day.   90 tablet   1     BP 165/89  Pulse 48  Temp 97.5 F (36.4 C) (Oral)  Resp 18  SpO2 100%  Physical Exam General: Well-developed, well-nourished female in no acute distress; appearance consistent with age of record HENT: normocephalic, atraumatic; TMs normal Eyes: pupils equal round and reactive to light; extraocular muscles intact Neck: supple Heart: regular rate and rhythm Lungs: clear to auscultation bilaterally Abdomen:  soft; nondistended; nontender; bowel sounds present Extremities: No deformity; full range of motion; pulses normal; no edema Neurologic: Awake, alert and oriented; motor function intact in all extremities and symmetric; no facial droop; mildly ataxic gait Skin: Warm and dry Psychiatric: Normal mood and affect    ED Course  Procedures (including critical care time)     MDM   Nursing notes and vitals signs, including pulse oximetry, reviewed.  Summary of this visit's results, reviewed by myself:  Labs:  Results for orders placed during the hospital encounter of 05/05/12  TROPONIN I      Component Value Range   Troponin I <0.30  <0.30 ng/mL  CBC WITH DIFFERENTIAL      Component Value Range   WBC 9.1  4.0 - 10.5 K/uL   RBC 4.99  3.87 - 5.11 MIL/uL   Hemoglobin 14.9  12.0 - 15.0 g/dL   HCT 16.1  09.6 - 04.5 %   MCV 87.0  78.0 - 100.0 fL   MCH 29.9  26.0 - 34.0 pg   MCHC 34.3  30.0 - 36.0 g/dL   RDW 40.9  81.1 - 91.4 %   Platelets 257  150 - 400 K/uL   Neutrophils Relative 68  43 - 77 %   Neutro Abs 6.2  1.7 - 7.7 K/uL   Lymphocytes Relative 22  12 - 46 %   Lymphs Abs 2.0  0.7 - 4.0 K/uL   Monocytes Relative 8  3 - 12 %   Monocytes Absolute 0.7  0.1 - 1.0 K/uL   Eosinophils Relative 1  0 - 5 %   Eosinophils Absolute 0.1  0.0 - 0.7 K/uL   Basophils Relative 1  0 - 1 %   Basophils Absolute 0.1  0.0 - 0.1 K/uL  BASIC METABOLIC PANEL      Component Value Range   Sodium 136  135 - 145 mEq/L   Potassium 3.7  3.5 - 5.1 mEq/L   Chloride  98  96 - 112 mEq/L   CO2 28  19 - 32 mEq/L   Glucose, Bld 145 (*) 70 - 99 mg/dL   BUN 18  6 - 23 mg/dL   Creatinine, Ser 1.61  0.50 - 1.10 mg/dL   Calcium 9.3  8.4 - 09.6 mg/dL   GFR calc non Af Amer 76 (*) >90 mL/min   GFR calc Af Amer 88 (*) >90 mL/min  URINALYSIS, ROUTINE W REFLEX MICROSCOPIC      Component Value Range   Color, Urine YELLOW  YELLOW   APPearance CLEAR  CLEAR   Specific Gravity, Urine 1.021  1.005 - 1.030   pH 6.0   5.0 - 8.0   Glucose, UA NEGATIVE  NEGATIVE mg/dL   Hgb urine dipstick NEGATIVE  NEGATIVE   Bilirubin Urine NEGATIVE  NEGATIVE   Ketones, ur NEGATIVE  NEGATIVE mg/dL   Protein, ur NEGATIVE  NEGATIVE mg/dL   Urobilinogen, UA 0.2  0.0 - 1.0 mg/dL   Nitrite NEGATIVE  NEGATIVE   Leukocytes, UA SMALL (*) NEGATIVE  URINE MICROSCOPIC-ADD ON      Component Value Range   WBC, UA 3-6  <3 WBC/hpf   RBC / HPF 0-2  <3 RBC/hpf   Bacteria, UA RARE  RARE    Imaging Studies: Ct Head Wo Contrast  05/06/2012  *RADIOLOGY REPORT*  Clinical Data: Dizziness.  CT HEAD WITHOUT CONTRAST  Technique:  Contiguous axial images were obtained from the base of the skull through the vertex without contrast.  Comparison: 12/08/2011  Findings: There is atrophy and chronic small vessel disease changes. No acute intracranial abnormality.  Specifically, no hemorrhage, hydrocephalus, mass lesion, acute infarction, or significant intracranial injury.  No acute calvarial abnormality.  Mucosal thickening within the paranasal sinuses.  No air fluid levels.  Mastoid air cells are clear.  IMPRESSION: No acute intracranial abnormality.  Atrophy, chronic microvascular disease.   Original Report Authenticated By: Charlett Nose, M.D.    3:50 AM Patient is awake alert. She is been ambulating to the bathroom without difficulty and improved from earlier. Her symptomatology and history of recent sinus trouble suggests that this is related to her in her ears.        Hanley Seamen, MD 05/06/12 0012  Hanley Seamen, MD 05/06/12 0454  Hanley Seamen, MD 05/06/12 [phone]

## 2012-05-06 ENCOUNTER — Emergency Department (HOSPITAL_COMMUNITY): Payer: Medicare Other

## 2012-05-06 LAB — URINALYSIS, ROUTINE W REFLEX MICROSCOPIC
Bilirubin Urine: NEGATIVE
Nitrite: NEGATIVE
Specific Gravity, Urine: 1.021 (ref 1.005–1.030)
Urobilinogen, UA: 0.2 mg/dL (ref 0.0–1.0)
pH: 6 (ref 5.0–8.0)

## 2012-05-06 LAB — BASIC METABOLIC PANEL
BUN: 18 mg/dL (ref 6–23)
Chloride: 98 mEq/L (ref 96–112)
GFR calc Af Amer: 88 mL/min — ABNORMAL LOW (ref >90)
GFR calc non Af Amer: 76 mL/min — ABNORMAL LOW (ref >90)
Potassium: 3.7 mEq/L (ref 3.5–5.1)
Sodium: 136 mEq/L (ref 135–145)

## 2012-05-06 LAB — CBC WITH DIFFERENTIAL/PLATELET
Basophils Absolute: 0.1 10*3/uL (ref 0.0–0.1)
Basophils Relative: 1 % (ref 0–1)
Eosinophils Absolute: 0.1 10*3/uL (ref 0.0–0.7)
Hemoglobin: 14.9 g/dL (ref 12.0–15.0)
MCH: 29.9 pg (ref 26.0–34.0)
MCHC: 34.3 g/dL (ref 30.0–36.0)
Monocytes Relative: 8 % (ref 3–12)
Neutro Abs: 6.2 10*3/uL (ref 1.7–7.7)
Neutrophils Relative %: 68 % (ref 43–77)
Platelets: 257 10*3/uL (ref 150–400)
RDW: 13.1 % (ref 11.5–15.5)

## 2012-05-06 LAB — URINE MICROSCOPIC-ADD ON

## 2012-05-06 LAB — TROPONIN I: Troponin I: <0.3 ng/mL (ref <0.30)

## 2012-05-06 MED ORDER — MECLIZINE HCL 25 MG PO TABS: 25.0000 mg | ORAL_TABLET | Freq: Three times a day (TID) | ORAL | Status: DC | PRN

## 2012-05-11 ENCOUNTER — Encounter: Payer: Self-pay | Admitting: Family Medicine

## 2012-05-11 ENCOUNTER — Telehealth: Payer: Self-pay | Admitting: Internal Medicine

## 2012-05-11 ENCOUNTER — Ambulatory Visit (INDEPENDENT_AMBULATORY_CARE_PROVIDER_SITE_OTHER): Payer: Medicare Other | Admitting: Family Medicine

## 2012-05-11 VITALS — BP 140/80 | Temp 98.0°F | Wt 140.0 lb

## 2012-05-11 DIAGNOSIS — J309 Allergic rhinitis, unspecified: Secondary | ICD-10-CM

## 2012-05-11 DIAGNOSIS — Z23 Encounter for immunization: Secondary | ICD-10-CM

## 2012-05-11 NOTE — Progress Notes (Addendum)
Subjective:     Patient ID: Robin Ferguson, female   DOB: 03-23-1923, 78 y.o.   MRN: [id number]  HPI 78 year old pt of Dr. Cato Mulligan here today complaining of persistent sinus drainage that she reports has been ongoing without much relief for the last 2 months.  Denies sensation of sinus pressure, HA, cough, sore throat, sneezing, fever, chills, chest pain, or shortness of breath.  She saw Dr. Selena Batten last week for same symptoms and was prescribed amoxicillin 500mg  PO three times daily, which she has been taking without relief in symptoms.  Says that there are some dusty areas in her home, but does not have any pets, and denies any specific environmental or seasonal allergies.  Says that these symptoms have not affected her sleep, and that she sleeps through the night, waking up feeling rested.  Also complains of diarrhea that started this morning.  Denies any recent changes in diet.  Review of Systems  Constitutional: Negative for fever and fatigue.  HENT: Positive for rhinorrhea and postnasal drip. Negative for hearing loss, ear pain, sore throat, sneezing, trouble swallowing and sinus pressure.   Eyes: Negative for pain and itching.  Respiratory: Negative for apnea, cough, chest tightness and shortness of breath.   Cardiovascular: Negative for chest pain.  Gastrointestinal: Positive for diarrhea (Started this AM.).  Neurological: Negative for dizziness, light-headedness and headaches.       Objective:   Physical Exam  Constitutional: She is oriented to person, place, and time. She appears well-developed and well-nourished.  HENT:  Head: Normocephalic and atraumatic.  Right Ear: External ear normal.  Left Ear: External ear normal.  Nose: Nose normal.  Mouth/Throat: Oropharynx is clear and moist. No oropharyngeal exudate.       TMs clear bilaterally.  Eyes: Conjunctivae normal are normal. Pupils are equal, round, and reactive to light. Right eye exhibits no discharge. Left eye exhibits no  discharge.  Cardiovascular: Regular rhythm and normal heart sounds.        Bradycardic at 56 bpm.  Pulmonary/Chest: Effort normal and breath sounds normal. No respiratory distress.  Lymphadenopathy:    She has no cervical adenopathy.  Neurological: She is alert and oriented to person, place, and time.  Skin: Skin is warm and dry. No rash noted. No erythema.       Assessment:     78 year old with several week history of rhinorrhea and diarrhea that started this morning here for follow-up because of no relief in symptoms.    Plan:     1. Rhinorrhea: likely allergic, given that pt takes Flonase and loratadine.  Trial of amoxicillin after her visit last week did not provide relief in symptoms, and she has not had any fever, chills, cough, sore throat, or sinus pressure suggestive of infectious etiology.  Try Allegra 180mg  by mouth daily and discontinue loratadine while on Allegra.  Follow up if this does not provide relief in symptoms. 2. Diarrhea: denies any changes in diet or sick contacts.  Could be related to alteration in intestinal flora after recent amoxicillin course.  Try probiotic yogurt and follow up if symptoms do no go away. 3. Received flu shot today.  Marthann Schiller, MS3     Agree with assessment and plan as per Marthann Schiller, MS 3 Evelena Peat MD

## 2012-05-11 NOTE — Telephone Encounter (Signed)
Caller: Aviyah/Patient; Patient Name: Robin Ferguson; PCP: Birdie Sons (Adults only); Best Callback Phone Number: [phone]; Reason for call: Other.  Patient states she has been sick with sinus issues for 1 month 04/2012.  She was seen in the office for the same on 05/01/12 and placed on Amoxicillen. She has only  X3 pills left.   Afebrile. "I feel awful and I really need to be seen".   +loose stools today. No n/v. no sore throat. Patient having difficult time describing any symptoms . "I just feel bad".  Emergent s/sx ruled out per Upper Respiratory Infection Protocol with exception to "Symptoms worsen after 7 days or symptoms do not improve after 14 days of home care".  See provider in 24 hours.  Appointment scheduled today 05/11/12 at 09:45 with Dr. Caryl Never MD. Call back parameters reviewedl Understanding expressed.

## 2012-05-11 NOTE — Patient Instructions (Signed)
Try Allegra 180mg  once per day by mouth for your sinus symptoms, and stop taking Claritin (loratadine) while you are on Allegra. Your diarrhea may be related to your recent antibiotics.  Try eating some probiotic yogurt for relief. Please follow up with Dr. Cato Mulligan or Dr. Caryl Never if you do not experience any relief in your symptoms.

## 2012-05-23 DIAGNOSIS — J309 Allergic rhinitis, unspecified: Secondary | ICD-10-CM | POA: Diagnosis not present

## 2012-05-25 DIAGNOSIS — J309 Allergic rhinitis, unspecified: Secondary | ICD-10-CM | POA: Diagnosis not present

## 2012-06-07 DIAGNOSIS — J309 Allergic rhinitis, unspecified: Secondary | ICD-10-CM | POA: Diagnosis not present

## 2012-06-13 DIAGNOSIS — J309 Allergic rhinitis, unspecified: Secondary | ICD-10-CM | POA: Diagnosis not present

## 2012-06-14 DIAGNOSIS — J309 Allergic rhinitis, unspecified: Secondary | ICD-10-CM | POA: Diagnosis not present

## 2012-06-15 DIAGNOSIS — C44721 Squamous cell carcinoma of skin of unspecified lower limb, including hip: Secondary | ICD-10-CM | POA: Diagnosis not present

## 2012-06-21 DIAGNOSIS — J309 Allergic rhinitis, unspecified: Secondary | ICD-10-CM | POA: Diagnosis not present

## 2012-07-05 DIAGNOSIS — J309 Allergic rhinitis, unspecified: Secondary | ICD-10-CM | POA: Diagnosis not present

## 2012-07-16 DIAGNOSIS — J309 Allergic rhinitis, unspecified: Secondary | ICD-10-CM | POA: Diagnosis not present

## 2012-07-19 DIAGNOSIS — C44611 Basal cell carcinoma of skin of unspecified upper limb, including shoulder: Secondary | ICD-10-CM | POA: Diagnosis not present

## 2012-07-19 DIAGNOSIS — Z85828 Personal history of other malignant neoplasm of skin: Secondary | ICD-10-CM | POA: Diagnosis not present

## 2012-07-31 DIAGNOSIS — J309 Allergic rhinitis, unspecified: Secondary | ICD-10-CM | POA: Diagnosis not present

## 2012-08-03 ENCOUNTER — Encounter: Payer: Self-pay | Admitting: Family Medicine

## 2012-08-03 ENCOUNTER — Ambulatory Visit (INDEPENDENT_AMBULATORY_CARE_PROVIDER_SITE_OTHER): Payer: Medicare Other | Admitting: Family Medicine

## 2012-08-03 VITALS — BP 112/84 | HR 67 | Temp 98.5°F | Wt 149.0 lb

## 2012-08-03 DIAGNOSIS — S61419A Laceration without foreign body of unspecified hand, initial encounter: Secondary | ICD-10-CM

## 2012-08-03 DIAGNOSIS — S61409A Unspecified open wound of unspecified hand, initial encounter: Secondary | ICD-10-CM | POA: Diagnosis not present

## 2012-08-03 MED ORDER — MUPIROCIN CALCIUM 2 % EX CREA: TOPICAL_CREAM | Freq: Three times a day (TID) | CUTANEOUS | Status: DC

## 2012-08-03 NOTE — Progress Notes (Signed)
Chief Complaint  Patient presents with  . Cut on right hand    HPI:  Acute visit for cut on hand: -takes asa -hit hand on a wall board 2 days ago and suffered a skin tear on her hand -washed this thoroughly with soap and water  -reports she is not worried about it  - but husband wanted her to have it examined -had td within last 5 years -no fevers, swelling, erythema, drainages  ROS: See pertinent positives and negatives per HPI.  Past Medical History  Diagnosis Date  . Allergy     rhinitis  . Asthma   . Hypertension   . Hypothyroidism   . Hyperlipidemia   . Hx of adenomatous colonic polyps   . Diverticulosis   . Esophageal stricture   . Hemorrhoids   . PMB (postmenopausal bleeding)   . Tachycardia   . Cancer     skin- right shoulder-Squamous cell-legs  . Osteopenia     Family History  Problem Relation Age of Onset  . Cancer Mother     bone  . Heart disease Brother   . Diabetes Brother   . Heart disease Brother   . Diabetes Brother     History   Social History  . Marital Status: Widowed    Spouse Name: N/A    Number of Children: N/A  . Years of Education: N/A   Social History Main Topics  . Smoking status: Former Games developer  . Smokeless tobacco: None  . Alcohol Use: Yes     Comment: occas  . Drug Use: None  . Sexually Active: No   Other Topics Concern  . None   Social History Narrative  . None    Current outpatient prescriptions:amoxicillin (AMOXIL) 500 MG capsule, Take 1 capsule (500 mg total) by mouth 3 (three) times daily., Disp: 30 capsule, Rfl: 0;  aspirin 81 MG tablet, Take 81 mg by mouth every Monday, Wednesday, and Friday. Take three times weekly, Disp: , Rfl: ;  calcium carbonate (TUMS) 500 MG chewable tablet, Chew 1 tablet by mouth every Monday, Wednesday, and Friday. , Disp: , Rfl:  cholecalciferol (VITAMIN D) 1000 UNITS tablet, Take 1,000 Units by mouth daily.  , Disp: , Rfl: ;  donepezil (ARICEPT) 10 MG tablet, Take 1 tablet (10 mg total)  by mouth at bedtime., Disp: 90 tablet, Rfl: 3;  fluticasone (FLONASE) 50 MCG/ACT nasal spray, Place 2 sprays into the nose daily., Disp: 16 g, Rfl: 3;  glucosamine-chondroitin 500-400 MG tablet, Take 1 tablet by mouth daily. , Disp: , Rfl:  levothyroxine (SYNTHROID, LEVOTHROID) 100 MCG tablet, Take 1 tablet (100 mcg total) by mouth daily., Disp: 90 tablet, Rfl: 1;  lisinopril (PRINIVIL,ZESTRIL) 20 MG tablet, Take 1 tablet (20 mg total) by mouth daily., Disp: 90 tablet, Rfl: 3;  loratadine (ALLERGY RELIEF) 10 MG tablet, Take 10 mg by mouth daily.  , Disp: , Rfl:  meclizine (ANTIVERT) 25 MG tablet, Take 1 tablet (25 mg total) by mouth 3 (three) times daily as needed for dizziness., Disp: 30 tablet, Rfl: 0;  metoprolol (LOPRESSOR) 50 MG tablet, Take 0.5 tablets (25 mg total) by mouth 2 (two) times daily., Disp: 30 tablet, Rfl: 1;  RABEprazole (ACIPHEX) 20 MG tablet, Take 1 tablet (20 mg total) by mouth daily., Disp: 90 tablet, Rfl: 1 rosuvastatin (CRESTOR) 20 MG tablet, Take 1.5 tablets (30 mg total) by mouth every other day., Disp: 90 tablet, Rfl: 1;  mupirocin cream (BACTROBAN) 2 %, Apply topically 3 (three) times  daily., Disp: 15 g, Rfl: 0  EXAM:  Filed Vitals:   08/03/12 1352  BP: 112/84  Pulse: 67  Temp: 98.5 F (36.9 C)    There is no height on file to calculate BMI.  GENERAL: vitals reviewed and listed above, alert, oriented, appears well hydrated and in no acute distress  HEENT: atraumatic, conjunttiva clear, no obvious abnormalities on inspection of external nose and ears  NECK: no obvious masses on inspection  SKIN: small V shaped skin tear on R dorsal hand without any swelling erythema or signs of infection that appears to be healing on its own  MS: moves all extremities without noticeable abnormality  PSYCH: pleasant and cooperative, no obvious depression or anxiety  ASSESSMENT AND PLAN:  Discussed the following assessment and plan:  1. Skin tear of hand without complication   mupirocin cream (BACTROBAN) 2 %   -offered to explore wound and debride - pt prefers not to. Will rx topical antibiotic to prevent infection and pt to see a doctor immediately if should develop any swelling, drainage or trouble with healing. -Patient advised to return or notify a doctor immediately if symptoms worsen or persist or new concerns arise.  Patient Instructions  -wash with soap and water when in shower  -keep gloves on if cleaning or doing dishes  -use antibiotic ointment 2 times daily  -follow up immediately if any swelling, redness, drainage or worsening     Robin Ferguson R.

## 2012-08-03 NOTE — Patient Instructions (Addendum)
-  wash with soap and water when in shower  -keep gloves on if cleaning or doing dishes  -use antibiotic ointment 2 times daily  -follow up immediately if any swelling, redness, drainage or worsening

## 2012-08-10 DIAGNOSIS — J309 Allergic rhinitis, unspecified: Secondary | ICD-10-CM | POA: Diagnosis not present

## 2012-08-28 DIAGNOSIS — J309 Allergic rhinitis, unspecified: Secondary | ICD-10-CM | POA: Diagnosis not present

## 2012-08-29 ENCOUNTER — Ambulatory Visit: Payer: Medicare Other | Admitting: Internal Medicine

## 2012-08-29 ENCOUNTER — Encounter: Payer: Self-pay | Admitting: Family

## 2012-08-29 ENCOUNTER — Ambulatory Visit (INDEPENDENT_AMBULATORY_CARE_PROVIDER_SITE_OTHER): Payer: Medicare Other | Admitting: Family

## 2012-08-29 ENCOUNTER — Ambulatory Visit: Payer: Medicare Other | Admitting: Family

## 2012-08-29 VITALS — BP 154/90 | HR 71 | Wt 149.0 lb

## 2012-08-29 DIAGNOSIS — K219 Gastro-esophageal reflux disease without esophagitis: Secondary | ICD-10-CM

## 2012-08-29 DIAGNOSIS — E8779 Other fluid overload: Secondary | ICD-10-CM

## 2012-08-29 DIAGNOSIS — R609 Edema, unspecified: Secondary | ICD-10-CM | POA: Diagnosis not present

## 2012-08-29 MED ORDER — RABEPRAZOLE SODIUM 20 MG PO TBEC: 20.0000 mg | DELAYED_RELEASE_TABLET | Freq: Every day | ORAL | Status: DC

## 2012-08-29 MED ORDER — FUROSEMIDE 20 MG PO TABS: 20.0000 mg | ORAL_TABLET | Freq: Every day | ORAL | Status: DC

## 2012-08-29 NOTE — Progress Notes (Signed)
Subjective:    Patient ID: Robin Ferguson, female    DOB: March 02, 1923, 79 y.o.   MRN: [id number]  HPI 79 year old white female, nonsmoker, patient of Dr. Cato Mulligan is in today with complaints of bilateral peripheral edema x2-3 days. Patient recently returned from a cruise where she ate various foods but denies having any extra salt to her foods. Reports gaining about 2-3 pounds. She denies any chest pain, shortness of breath, palpitations. Has never had peripheral edema in the past. She has a history of hypothyroidism and hypertension.  Patient also has a history of GERD a like a refill on AcipHex 20 mg. Reports doing well on current medication regimen. No heartburn indigestion.   Review of Systems  Constitutional: Negative.   HENT: Negative.   Respiratory: Negative.   Cardiovascular: Positive for leg swelling. Negative for chest pain and palpitations.  Gastrointestinal: Negative.   Genitourinary: Negative.   Musculoskeletal: Negative.   Skin: Negative.   Psychiatric/Behavioral: Negative.    Past Medical History  Diagnosis Date  . Allergy     rhinitis  . Asthma   . Hypertension   . Hypothyroidism   . Hyperlipidemia   . Hx of adenomatous colonic polyps   . Diverticulosis   . Esophageal stricture   . Hemorrhoids   . PMB (postmenopausal bleeding)   . Tachycardia   . Cancer     skin- right shoulder-Squamous cell-legs  . Osteopenia     History   Social History  . Marital Status: Widowed    Spouse Name: N/A    Number of Children: N/A  . Years of Education: N/A   Occupational History  . Not on file.   Social History Main Topics  . Smoking status: Former Games developer  . Smokeless tobacco: Not on file  . Alcohol Use: Yes     Comment: occas  . Drug Use: Not on file  . Sexually Active: No   Other Topics Concern  . Not on file   Social History Narrative  . No narrative on file    Past Surgical History  Procedure Laterality Date  . Cataract extraction    . Nasal sinus  surgery    . Shoulder surgery    . Tubal ligation  1959  . Hysteroscopy    . Dilation and curettage of uterus      Family History  Problem Relation Age of Onset  . Cancer Mother     bone  . Heart disease Brother   . Diabetes Brother   . Heart disease Brother   . Diabetes Brother     Allergies  Allergen Reactions  . Procaine Hcl     REACTION: heart palpitationbs    Current Outpatient Prescriptions on File Prior to Visit  Medication Sig Dispense Refill  . aspirin 81 MG tablet Take 81 mg by mouth every Monday, Wednesday, and Friday. Take three times weekly      . calcium carbonate (TUMS) 500 MG chewable tablet Chew 1 tablet by mouth every Monday, Wednesday, and Friday.       . cholecalciferol (VITAMIN D) 1000 UNITS tablet Take 1,000 Units by mouth daily.        Marland Kitchen donepezil (ARICEPT) 10 MG tablet Take 1 tablet (10 mg total) by mouth at bedtime.  90 tablet  3  . fluticasone (FLONASE) 50 MCG/ACT nasal spray Place 2 sprays into the nose daily.  16 g  3  . glucosamine-chondroitin 500-400 MG tablet Take 1 tablet by mouth daily.       Marland Kitchen  levothyroxine (SYNTHROID, LEVOTHROID) 100 MCG tablet Take 1 tablet (100 mcg total) by mouth daily.  90 tablet  1  . lisinopril (PRINIVIL,ZESTRIL) 20 MG tablet Take 1 tablet (20 mg total) by mouth daily.  90 tablet  3  . loratadine (ALLERGY RELIEF) 10 MG tablet Take 10 mg by mouth daily.        . meclizine (ANTIVERT) 25 MG tablet Take 1 tablet (25 mg total) by mouth 3 (three) times daily as needed for dizziness.  30 tablet  0  . metoprolol (LOPRESSOR) 50 MG tablet Take 0.5 tablets (25 mg total) by mouth 2 (two) times daily.  30 tablet  1  . mupirocin cream (BACTROBAN) 2 % Apply topically 3 (three) times daily.  15 g  0  . rosuvastatin (CRESTOR) 20 MG tablet Take 1.5 tablets (30 mg total) by mouth every other day.  90 tablet  1  . amoxicillin (AMOXIL) 500 MG capsule Take 1 capsule (500 mg total) by mouth 3 (three) times daily.  30 capsule  0   No current  facility-administered medications on file prior to visit.    BP 154/90  Pulse 71  Wt 149 lb (67.586 kg)  BMI 27.91 kg/m2  SpO2 97%chart    Objective:   Physical Exam  Constitutional: She is oriented to person, place, and time. She appears well-developed and well-nourished.  HENT:  Right Ear: External ear normal.  Left Ear: External ear normal.  Nose: Nose normal.  Mouth/Throat: Oropharynx is clear and moist.  Neck: Normal range of motion. Neck supple. No thyromegaly present.  Cardiovascular: Normal rate, regular rhythm and normal heart sounds.   Pulmonary/Chest: Effort normal and breath sounds normal.  Abdominal: Soft. Bowel sounds are normal.  Neurological: She is alert and oriented to person, place, and time.  Skin: Skin is warm and dry.  Psychiatric: She has a normal mood and affect.          Assessment & Plan:  Assessment/Plan:  1. Peripheral edema-likely related to sodium retention. Lasix 20 mg once daily. Lab sent to include BMP, TSH, UA will notify patient pending results. Peripheral edema and home care treatment provided. Patient call the office if symptoms worsen or persist. Recheck a schedule, and as needed.

## 2012-08-29 NOTE — Patient Instructions (Addendum)
Peripheral Edema  You have swelling in your legs (peripheral edema). This swelling is due to excess accumulation of salt and water in your body. Edema may be a sign of heart, kidney or liver disease, or a side effect of a medication. It may also be due to problems in the leg veins. Elevating your legs and using special support stockings may be very helpful, if the cause of the swelling is due to poor venous circulation. Avoid long periods of standing, whatever the cause.  Treatment of edema depends on identifying the cause. Chips, pretzels, pickles and other salty foods should be avoided. Restricting salt in your diet is almost always needed. Water pills (diuretics) are often used to remove the excess salt and water from your body via urine. These medicines prevent the kidney from reabsorbing sodium. This increases urine flow.  Diuretic treatment may also result in lowering of potassium levels in your body. Potassium supplements may be needed if you have to use diuretics daily. Daily weights can help you keep track of your progress in clearing your edema. You should call your caregiver for follow up care as recommended.  SEEK IMMEDIATE MEDICAL CARE IF:    You have increased swelling, pain, redness, or heat in your legs.   You develop shortness of breath, especially when lying down.   You develop chest or abdominal pain, weakness, or fainting.   You have a fever.  Document Released: 07/28/2004 Document Revised: 09/12/2011 Document Reviewed: 07/08/2009  [employer] Patient Information 2013 [employer]

## 2012-08-30 ENCOUNTER — Other Ambulatory Visit: Payer: Self-pay | Admitting: Internal Medicine

## 2012-08-31 ENCOUNTER — Ambulatory Visit (INDEPENDENT_AMBULATORY_CARE_PROVIDER_SITE_OTHER): Payer: Medicare Other | Admitting: Family Medicine

## 2012-08-31 ENCOUNTER — Encounter: Payer: Self-pay | Admitting: Family Medicine

## 2012-08-31 VITALS — BP 130/80 | HR 68 | Temp 98.6°F | Wt 150.0 lb

## 2012-08-31 DIAGNOSIS — R600 Localized edema: Secondary | ICD-10-CM

## 2012-08-31 DIAGNOSIS — R609 Edema, unspecified: Secondary | ICD-10-CM

## 2012-08-31 NOTE — Patient Instructions (Addendum)
-  elevated legs 30 minutes twice dialy  -use compression stockings  -decrease salt  -get the labs Padonda ordered  -follow up with your doctor as scheduled or sooner if needed

## 2012-08-31 NOTE — Progress Notes (Signed)
Chief Complaint  Patient presents with  . bilateral leg swelling    lump on right leg; soreness     HPI:  "Bump" on R leg: -noticed yesterday -seen by Padonda 2 days ago for bilat LE edema after returning from from a cruise and has been taking her lasix once - she reports swelling as improved a lot -has not elevated legs -has been on feet a lot and was on feet a lot and ate different foods on cruise recently before swelling -feels great otherwise, denies fevers, chills, CP, SOB, palpitations  ROS: See pertinent positives and negatives per HPI.  Past Medical History  Diagnosis Date  . Allergy     rhinitis  . Asthma   . Hypertension   . Hypothyroidism   . Hyperlipidemia   . Hx of adenomatous colonic polyps   . Diverticulosis   . Esophageal stricture   . Hemorrhoids   . PMB (postmenopausal bleeding)   . Tachycardia   . Cancer     skin- right shoulder-Squamous cell-legs  . Osteopenia     Family History  Problem Relation Age of Onset  . Cancer Mother     bone  . Heart disease Brother   . Diabetes Brother   . Heart disease Brother   . Diabetes Brother     History   Social History  . Marital Status: Widowed    Spouse Name: N/A    Number of Children: N/A  . Years of Education: N/A   Social History Main Topics  . Smoking status: Former Games developer  . Smokeless tobacco: None  . Alcohol Use: Yes     Comment: occas  . Drug Use: None  . Sexually Active: No   Other Topics Concern  . None   Social History Narrative  . None    Current outpatient prescriptions:aspirin 81 MG tablet, Take 81 mg by mouth every Monday, Wednesday, and Friday. Take three times weekly, Disp: , Rfl: ;  calcium carbonate (TUMS) 500 MG chewable tablet, Chew 1 tablet by mouth every Monday, Wednesday, and Friday. , Disp: , Rfl: ;  cholecalciferol (VITAMIN D) 1000 UNITS tablet, Take 1,000 Units by mouth daily.  , Disp: , Rfl:  donepezil (ARICEPT) 10 MG tablet, Take 1 tablet (10 mg total) by mouth  at bedtime., Disp: 90 tablet, Rfl: 3;  fluticasone (FLONASE) 50 MCG/ACT nasal spray, Place 2 sprays into the nose daily., Disp: 16 g, Rfl: 3;  furosemide (LASIX) 20 MG tablet, Take 1 tablet (20 mg total) by mouth daily., Disp: 30 tablet, Rfl: 0;  glucosamine-chondroitin 500-400 MG tablet, Take 1 tablet by mouth daily. , Disp: , Rfl:  levothyroxine (SYNTHROID, LEVOTHROID) 100 MCG tablet, Take 1 tablet (100 mcg total) by mouth daily., Disp: 90 tablet, Rfl: 1;  lisinopril (PRINIVIL,ZESTRIL) 20 MG tablet, Take 1 tablet (20 mg total) by mouth daily., Disp: 90 tablet, Rfl: 3;  loratadine (ALLERGY RELIEF) 10 MG tablet, Take 10 mg by mouth daily.  , Disp: , Rfl:  meclizine (ANTIVERT) 25 MG tablet, Take 1 tablet (25 mg total) by mouth 3 (three) times daily as needed for dizziness., Disp: 30 tablet, Rfl: 0;  metoprolol (LOPRESSOR) 50 MG tablet, Take 0.5 tablets (25 mg total) by mouth 2 (two) times daily., Disp: 30 tablet, Rfl: 1;  mupirocin cream (BACTROBAN) 2 %, Apply topically 3 (three) times daily., Disp: 15 g, Rfl: 0 RABEprazole (ACIPHEX) 20 MG tablet, Take 1 tablet (20 mg total) by mouth daily., Disp: 90 tablet, Rfl: 1;  RABEprazole (ACIPHEX) 20 MG tablet, TAKE ONE TABLET BY MOUTH DAILY, Disp: 30 tablet, Rfl: 0;  rosuvastatin (CRESTOR) 20 MG tablet, Take 1.5 tablets (30 mg total) by mouth every other day., Disp: 90 tablet, Rfl: 1;  amoxicillin (AMOXIL) 500 MG capsule, Take 1 capsule (500 mg total) by mouth 3 (three) times daily., Disp: 30 capsule, Rfl: 0  EXAM:  Filed Vitals:   08/31/12 1046  BP: 130/80  Pulse: 68  Temp: 98.6 F (37 C)    Body mass index is 28.1 kg/(m^2).  GENERAL: vitals reviewed and listed above, alert, oriented, appears well hydrated and in no acute distress  HEENT: atraumatic, conjunttiva clear, no obvious abnormalities on inspection of external nose and ears  NECK: no obvious masses on inspection  LUNGS: clear to auscultation bilaterally, no wheezes, rales or rhonchi, good air  movement  CV: HRRR, no JVD, bilateral 1+ pitting edema  SKIN: skin changes on LE consistent with chronic venous insufficiency, surgical scar R lower leg  MS: moves all extremities without noticeable abnormality  PSYCH: pleasant and cooperative, no obvious depression or anxiety  ASSESSMENT AND PLAN:  Discussed the following assessment and plan:  Lower extremity edema -Venous stasis dermatitis with likely venous insufficiency mild - triggered by heat, on feet and salt while on cruise and now resolving  -"bump" is where scar is from previous surgery -advised elevation of legs, continue lasix, compression stocking, get labs ordered by provider she saw two days ago and follow up with PCP as scheduled or sooner if concerns -Patient advised to return or notify a doctor immediately if symptoms worsen or persist or new concerns arise.  There are no Patient Instructions on file for this visit.   Kriste Basque R.

## 2012-10-03 DIAGNOSIS — J309 Allergic rhinitis, unspecified: Secondary | ICD-10-CM | POA: Diagnosis not present

## 2012-10-05 DIAGNOSIS — J309 Allergic rhinitis, unspecified: Secondary | ICD-10-CM | POA: Diagnosis not present

## 2012-10-09 DIAGNOSIS — J309 Allergic rhinitis, unspecified: Secondary | ICD-10-CM | POA: Diagnosis not present

## 2012-10-10 ENCOUNTER — Encounter: Payer: Medicare Other | Admitting: Internal Medicine

## 2012-10-10 NOTE — Assessment & Plan Note (Signed)
She has not had any recent exacerbation. She does not require short acting beta agonist.

## 2012-10-10 NOTE — Assessment & Plan Note (Signed)
Adequate control. Continue current medications. 

## 2012-10-10 NOTE — Assessment & Plan Note (Signed)
Lab Results  Component Value Date   TSH 2.73 12/02/2011   Will recheck TSH today.

## 2012-10-10 NOTE — Assessment & Plan Note (Signed)
Lipid Panel     Component Value Date/Time   CHOL 139 06/20/2011 0843   TRIG 105.0 06/20/2011 0843   HDL 53.60 06/20/2011 0843   CHOLHDL 3 06/20/2011 0843   VLDL 21.0 06/20/2011 0843   LDLCALC 64 06/20/2011 0843   she is on Crestor. Needs laboratory work.

## 2012-10-10 NOTE — Progress Notes (Signed)
79 year old female comes in for followup. She has hypertension, asthma, hypothyroidism, hyperlipidemia. She has subjective memory loss which has been stable. In regards to her asthma she's not had any recent flares and does not take any chronic steroid inhaler.  I've reviewed problem list, past medical history, medications. Reviewed family history, social history.  Review of systems: She admits to some fatigue and memory loss. She is still able to do all of her ADLs. No other specific complaints in a complete review of systems.  Examination. Vital signs reviewed. In general she appears well. Elderly female. No acute distress. Chest her auscultation. Cardiac exam S1-S2 are regular. Abdominal exam active bowel sounds, soft. Extremities no edema. Neurologic exam she is alert and oriented. This encounter was created in error - please disregard.

## 2012-10-12 DIAGNOSIS — J309 Allergic rhinitis, unspecified: Secondary | ICD-10-CM | POA: Diagnosis not present

## 2012-10-16 ENCOUNTER — Other Ambulatory Visit: Payer: Self-pay | Admitting: Internal Medicine

## 2012-10-16 ENCOUNTER — Other Ambulatory Visit: Payer: Self-pay | Admitting: *Deleted

## 2012-10-16 MED ORDER — MECLIZINE HCL 25 MG PO TABS: 25.0000 mg | ORAL_TABLET | Freq: Three times a day (TID) | ORAL | Status: DC | PRN

## 2012-10-19 ENCOUNTER — Other Ambulatory Visit: Payer: Self-pay | Admitting: Internal Medicine

## 2012-10-22 DIAGNOSIS — J309 Allergic rhinitis, unspecified: Secondary | ICD-10-CM | POA: Diagnosis not present

## 2012-10-29 DIAGNOSIS — J309 Allergic rhinitis, unspecified: Secondary | ICD-10-CM | POA: Diagnosis not present

## 2012-11-05 DIAGNOSIS — J309 Allergic rhinitis, unspecified: Secondary | ICD-10-CM | POA: Diagnosis not present

## 2012-11-06 DIAGNOSIS — J301 Allergic rhinitis due to pollen: Secondary | ICD-10-CM | POA: Diagnosis not present

## 2012-11-06 DIAGNOSIS — J3089 Other allergic rhinitis: Secondary | ICD-10-CM | POA: Diagnosis not present

## 2012-11-07 ENCOUNTER — Other Ambulatory Visit: Payer: Self-pay | Admitting: Internal Medicine

## 2012-11-15 DIAGNOSIS — J309 Allergic rhinitis, unspecified: Secondary | ICD-10-CM | POA: Diagnosis not present

## 2012-11-15 DIAGNOSIS — C44611 Basal cell carcinoma of skin of unspecified upper limb, including shoulder: Secondary | ICD-10-CM | POA: Diagnosis not present

## 2012-11-15 DIAGNOSIS — Z85828 Personal history of other malignant neoplasm of skin: Secondary | ICD-10-CM | POA: Diagnosis not present

## 2012-11-15 DIAGNOSIS — L570 Actinic keratosis: Secondary | ICD-10-CM | POA: Diagnosis not present

## 2012-11-19 DIAGNOSIS — J309 Allergic rhinitis, unspecified: Secondary | ICD-10-CM | POA: Diagnosis not present

## 2012-11-30 DIAGNOSIS — J309 Allergic rhinitis, unspecified: Secondary | ICD-10-CM | POA: Diagnosis not present

## 2012-12-10 DIAGNOSIS — J309 Allergic rhinitis, unspecified: Secondary | ICD-10-CM | POA: Diagnosis not present

## 2012-12-11 ENCOUNTER — Other Ambulatory Visit: Payer: Self-pay | Admitting: Internal Medicine

## 2012-12-12 ENCOUNTER — Other Ambulatory Visit: Payer: Self-pay | Admitting: *Deleted

## 2012-12-12 DIAGNOSIS — I10 Essential (primary) hypertension: Secondary | ICD-10-CM

## 2012-12-12 DIAGNOSIS — R001 Bradycardia, unspecified: Secondary | ICD-10-CM

## 2012-12-12 MED ORDER — METOPROLOL TARTRATE 50 MG PO TABS: 25.0000 mg | ORAL_TABLET | Freq: Two times a day (BID) | ORAL | Status: DC

## 2012-12-12 NOTE — Addendum Note (Signed)
Addended by: Alfred Levins D on: 12/12/2012 03:58 PM   Modules accepted: Orders

## 2012-12-20 DIAGNOSIS — J309 Allergic rhinitis, unspecified: Secondary | ICD-10-CM | POA: Diagnosis not present

## 2012-12-25 DIAGNOSIS — J309 Allergic rhinitis, unspecified: Secondary | ICD-10-CM | POA: Diagnosis not present

## 2013-01-03 ENCOUNTER — Other Ambulatory Visit: Payer: Self-pay | Admitting: *Deleted

## 2013-01-03 DIAGNOSIS — R413 Other amnesia: Secondary | ICD-10-CM

## 2013-01-03 DIAGNOSIS — J309 Allergic rhinitis, unspecified: Secondary | ICD-10-CM | POA: Diagnosis not present

## 2013-01-03 MED ORDER — DONEPEZIL HCL 10 MG PO TABS: 10.0000 mg | ORAL_TABLET | Freq: Every day | ORAL | Status: DC

## 2013-01-11 DIAGNOSIS — J309 Allergic rhinitis, unspecified: Secondary | ICD-10-CM | POA: Diagnosis not present

## 2013-01-15 ENCOUNTER — Other Ambulatory Visit: Payer: Self-pay | Admitting: Internal Medicine

## 2013-01-31 DIAGNOSIS — J309 Allergic rhinitis, unspecified: Secondary | ICD-10-CM | POA: Diagnosis not present

## 2013-02-06 DIAGNOSIS — J309 Allergic rhinitis, unspecified: Secondary | ICD-10-CM | POA: Diagnosis not present

## 2013-02-12 DIAGNOSIS — J309 Allergic rhinitis, unspecified: Secondary | ICD-10-CM | POA: Diagnosis not present

## 2013-02-28 DIAGNOSIS — L570 Actinic keratosis: Secondary | ICD-10-CM | POA: Diagnosis not present

## 2013-02-28 DIAGNOSIS — D692 Other nonthrombocytopenic purpura: Secondary | ICD-10-CM | POA: Diagnosis not present

## 2013-02-28 DIAGNOSIS — C44611 Basal cell carcinoma of skin of unspecified upper limb, including shoulder: Secondary | ICD-10-CM | POA: Diagnosis not present

## 2013-02-28 DIAGNOSIS — L905 Scar conditions and fibrosis of skin: Secondary | ICD-10-CM | POA: Diagnosis not present

## 2013-02-28 DIAGNOSIS — J309 Allergic rhinitis, unspecified: Secondary | ICD-10-CM | POA: Diagnosis not present

## 2013-03-01 DIAGNOSIS — Z961 Presence of intraocular lens: Secondary | ICD-10-CM | POA: Diagnosis not present

## 2013-03-01 DIAGNOSIS — H5210 Myopia, unspecified eye: Secondary | ICD-10-CM | POA: Diagnosis not present

## 2013-03-06 DIAGNOSIS — I872 Venous insufficiency (chronic) (peripheral): Secondary | ICD-10-CM | POA: Diagnosis not present

## 2013-03-08 ENCOUNTER — Other Ambulatory Visit: Payer: Self-pay | Admitting: Internal Medicine

## 2013-03-12 DIAGNOSIS — J309 Allergic rhinitis, unspecified: Secondary | ICD-10-CM | POA: Diagnosis not present

## 2013-03-18 DIAGNOSIS — J309 Allergic rhinitis, unspecified: Secondary | ICD-10-CM | POA: Diagnosis not present

## 2013-03-19 ENCOUNTER — Other Ambulatory Visit: Payer: Self-pay | Admitting: Internal Medicine

## 2013-03-26 DIAGNOSIS — J309 Allergic rhinitis, unspecified: Secondary | ICD-10-CM | POA: Diagnosis not present

## 2013-03-27 DIAGNOSIS — J309 Allergic rhinitis, unspecified: Secondary | ICD-10-CM | POA: Diagnosis not present

## 2013-03-28 ENCOUNTER — Telehealth: Payer: Self-pay | Admitting: Internal Medicine

## 2013-03-28 NOTE — Telephone Encounter (Signed)
Patient would like to know if she can get a flu shot and she does have a little sniffle.

## 2013-03-29 NOTE — Telephone Encounter (Signed)
Left message ok to get flu shot

## 2013-04-03 ENCOUNTER — Ambulatory Visit (INDEPENDENT_AMBULATORY_CARE_PROVIDER_SITE_OTHER): Payer: Medicare Other | Admitting: Internal Medicine

## 2013-04-03 ENCOUNTER — Other Ambulatory Visit: Payer: Self-pay | Admitting: Internal Medicine

## 2013-04-03 DIAGNOSIS — Z23 Encounter for immunization: Secondary | ICD-10-CM | POA: Diagnosis not present

## 2013-04-03 DIAGNOSIS — J309 Allergic rhinitis, unspecified: Secondary | ICD-10-CM | POA: Diagnosis not present

## 2013-04-04 NOTE — Progress Notes (Signed)
Flu immunization only. Patient not seen by physician.

## 2013-04-10 ENCOUNTER — Ambulatory Visit: Payer: Medicare Other

## 2013-04-17 DIAGNOSIS — C44611 Basal cell carcinoma of skin of unspecified upper limb, including shoulder: Secondary | ICD-10-CM | POA: Diagnosis not present

## 2013-04-18 DIAGNOSIS — L821 Other seborrheic keratosis: Secondary | ICD-10-CM | POA: Diagnosis not present

## 2013-04-18 DIAGNOSIS — L570 Actinic keratosis: Secondary | ICD-10-CM | POA: Diagnosis not present

## 2013-04-18 DIAGNOSIS — D485 Neoplasm of uncertain behavior of skin: Secondary | ICD-10-CM | POA: Diagnosis not present

## 2013-04-18 DIAGNOSIS — D239 Other benign neoplasm of skin, unspecified: Secondary | ICD-10-CM | POA: Diagnosis not present

## 2013-04-18 DIAGNOSIS — D1801 Hemangioma of skin and subcutaneous tissue: Secondary | ICD-10-CM | POA: Diagnosis not present

## 2013-04-18 DIAGNOSIS — L819 Disorder of pigmentation, unspecified: Secondary | ICD-10-CM | POA: Diagnosis not present

## 2013-04-18 DIAGNOSIS — Z85828 Personal history of other malignant neoplasm of skin: Secondary | ICD-10-CM | POA: Diagnosis not present

## 2013-04-19 ENCOUNTER — Encounter: Payer: Medicare Other | Admitting: Gynecology

## 2013-04-22 ENCOUNTER — Other Ambulatory Visit: Payer: Self-pay | Admitting: Internal Medicine

## 2013-04-23 ENCOUNTER — Other Ambulatory Visit: Payer: Self-pay | Admitting: *Deleted

## 2013-04-23 ENCOUNTER — Encounter: Payer: Self-pay | Admitting: Gynecology

## 2013-04-23 DIAGNOSIS — E785 Hyperlipidemia, unspecified: Secondary | ICD-10-CM

## 2013-04-23 MED ORDER — ROSUVASTATIN CALCIUM 20 MG PO TABS: 30.0000 mg | ORAL_TABLET | ORAL | Status: DC

## 2013-04-24 DIAGNOSIS — J309 Allergic rhinitis, unspecified: Secondary | ICD-10-CM | POA: Diagnosis not present

## 2013-04-28 ENCOUNTER — Observation Stay (HOSPITAL_COMMUNITY)
Admission: EM | Admit: 2013-04-28 | Discharge: 2013-04-29 | Disposition: A | Discharge status: Home / Self Care | Payer: Medicare Other | Attending: Internal Medicine | Admitting: Internal Medicine

## 2013-04-28 ENCOUNTER — Emergency Department (HOSPITAL_COMMUNITY): Payer: Medicare Other

## 2013-04-28 ENCOUNTER — Encounter (HOSPITAL_COMMUNITY): Payer: Self-pay | Admitting: Emergency Medicine

## 2013-04-28 ENCOUNTER — Ambulatory Visit (INDEPENDENT_AMBULATORY_CARE_PROVIDER_SITE_OTHER): Payer: Medicare Other | Admitting: Emergency Medicine

## 2013-04-28 VITALS — BP 98/60 | HR 61 | Temp 97.9°F | Resp 16 | Ht 61.0 in | Wt 138.2 lb

## 2013-04-28 DIAGNOSIS — R2681 Unsteadiness on feet: Secondary | ICD-10-CM

## 2013-04-28 DIAGNOSIS — R4182 Altered mental status, unspecified: Secondary | ICD-10-CM | POA: Diagnosis not present

## 2013-04-28 DIAGNOSIS — R61 Generalized hyperhidrosis: Secondary | ICD-10-CM

## 2013-04-28 DIAGNOSIS — F0390 Unspecified dementia without behavioral disturbance: Secondary | ICD-10-CM | POA: Diagnosis not present

## 2013-04-28 DIAGNOSIS — Z79899 Other long term (current) drug therapy: Secondary | ICD-10-CM | POA: Insufficient documentation

## 2013-04-28 DIAGNOSIS — R413 Other amnesia: Secondary | ICD-10-CM

## 2013-04-28 DIAGNOSIS — R262 Difficulty in walking, not elsewhere classified: Secondary | ICD-10-CM | POA: Diagnosis not present

## 2013-04-28 DIAGNOSIS — G319 Degenerative disease of nervous system, unspecified: Secondary | ICD-10-CM | POA: Diagnosis not present

## 2013-04-28 DIAGNOSIS — G459 Transient cerebral ischemic attack, unspecified: Principal | ICD-10-CM

## 2013-04-28 DIAGNOSIS — I6529 Occlusion and stenosis of unspecified carotid artery: Secondary | ICD-10-CM | POA: Diagnosis not present

## 2013-04-28 DIAGNOSIS — Z9181 History of falling: Secondary | ICD-10-CM | POA: Diagnosis not present

## 2013-04-28 DIAGNOSIS — R42 Dizziness and giddiness: Secondary | ICD-10-CM

## 2013-04-28 DIAGNOSIS — E785 Hyperlipidemia, unspecified: Secondary | ICD-10-CM | POA: Insufficient documentation

## 2013-04-28 DIAGNOSIS — I10 Essential (primary) hypertension: Secondary | ICD-10-CM

## 2013-04-28 LAB — URINALYSIS, ROUTINE W REFLEX MICROSCOPIC
Bilirubin Urine: NEGATIVE
Glucose, UA: NEGATIVE mg/dL
Hgb urine dipstick: NEGATIVE
Ketones, ur: 15 mg/dL — AB
Nitrite: NEGATIVE
Protein, ur: NEGATIVE mg/dL
Specific Gravity, Urine: 1.009 (ref 1.005–1.030)
Urobilinogen, UA: 0.2 mg/dL (ref 0.0–1.0)
pH: 5.5 (ref 5.0–8.0)

## 2013-04-28 LAB — PROTIME-INR
INR: 0.99 (ref 0.00–1.49)
Prothrombin Time: 12.9 seconds (ref 11.6–15.2)

## 2013-04-28 LAB — RAPID URINE DRUG SCREEN, HOSP PERFORMED
Amphetamines: NOT DETECTED
Barbiturates: NOT DETECTED
Benzodiazepines: NOT DETECTED
Cocaine: NOT DETECTED
Opiates: NOT DETECTED
Tetrahydrocannabinol: NOT DETECTED

## 2013-04-28 LAB — CBC WITH DIFFERENTIAL/PLATELET
Basophils Relative: 1 % (ref 0–1)
Eosinophils Absolute: 0.2 10*3/uL (ref 0.0–0.7)
HCT: 41 % (ref 36.0–46.0)
Hemoglobin: 13.7 g/dL (ref 12.0–15.0)
Lymphocytes Relative: 30 % (ref 12–46)
Lymphs Abs: 2.6 10*3/uL (ref 0.7–4.0)
MCH: 29.5 pg (ref 26.0–34.0)
MCHC: 33.4 g/dL (ref 30.0–36.0)
Monocytes Absolute: 0.7 10*3/uL (ref 0.1–1.0)
Monocytes Relative: 8 % (ref 3–12)
Neutrophils Relative %: 59 % (ref 43–77)
Platelets: 223 10*3/uL (ref 150–400)
RBC: 4.64 MIL/uL (ref 3.87–5.11)

## 2013-04-28 LAB — COMPREHENSIVE METABOLIC PANEL
ALT: 15 U/L (ref 0–35)
AST: 19 U/L (ref 0–37)
Albumin: 3.5 g/dL (ref 3.5–5.2)
Alkaline Phosphatase: 66 U/L (ref 39–117)
BUN: 16 mg/dL (ref 6–23)
CO2: 24 mEq/L (ref 19–32)
Chloride: 104 mEq/L (ref 96–112)
Creatinine, Ser: 0.84 mg/dL (ref 0.50–1.10)
GFR calc Af Amer: 70 mL/min — ABNORMAL LOW (ref >90)
Glucose, Bld: 88 mg/dL (ref 70–99)
Potassium: 3.7 mEq/L (ref 3.5–5.1)
Sodium: 141 mEq/L (ref 135–145)
Total Bilirubin: 0.3 mg/dL (ref 0.3–1.2)
Total Protein: 6.2 g/dL (ref 6.0–8.3)

## 2013-04-28 LAB — TROPONIN I: Troponin I: <0.3 ng/mL

## 2013-04-28 LAB — ETHANOL: Alcohol, Ethyl (B): <11 mg/dL (ref 0–11)

## 2013-04-28 LAB — URINE MICROSCOPIC-ADD ON

## 2013-04-28 LAB — APTT: aPTT: 29 s (ref 24–37)

## 2013-04-28 NOTE — ED Notes (Signed)
MD at bedside. 

## 2013-04-28 NOTE — ED Provider Notes (Signed)
I saw and evaluated the patient, reviewed the resident's note and I agree with the findings and plan.  EKG Interpretation   At time 19:30 shows SR at rate 57, LBBB, abnormal ECG.  No sig change from prior ECG's.        Pt with diaphoresis, dizziness that began after taking nap around 2 PM and woke up around 4 PM.  No slurred speech, confusion per spouse.  Pt went to [facility] around 5 PM, sent to the ED.  Pt has no CP, no SOB, nausea.  Pt felt improved but not completely back to normal.  Pt's symptoms did not begin until about 15 minutes after getting up and moving around, doesn't seem classic for vertigo although pt endorses some room movement and spinning sensation, didn't feel faint.  Work up is neg here, no focal defcitis neruologically on exam.  Normal finger to nose, heel shin, no arm drift.  Will admit for possible central vertigo or TIA and rule out.  No new ischemia on ECG, troponin is neg.     Gavin Pound. Josaphine Shimamoto, MD 04/29/13 0002

## 2013-04-28 NOTE — ED Provider Notes (Signed)
CSN: [id number]     Arrival date & time 04/28/13  1913 History   First MD Initiated Contact with Patient 04/28/13 1945     Chief Complaint  Patient presents with  . Dizziness   The history is provided by the patient. No language interpreter was used.   Robin Ferguson is an 79 year old Caucasian female with past medical history of hypertension hyperlipidemia who comes to the emergency department today after an episode of dizziness. She was at home and had been up walking around approximately 15 minutes after her nap when she developed severe diaphoresis and dizziness. She had difficulty ambulating but did not fall. The differential calcium which was so severe that she had to sit down.  She did not move her head prior to the episode of dizziness. She describes dizziness as a spinning sensation with the room spinning around her. She has no weakness, no numbness, no vision changes, and no speech difficulties.  Past Medical History  Diagnosis Date  . Allergy     rhinitis  . Asthma   . Hypertension   . Hypothyroidism   . Hyperlipidemia   . Hx of adenomatous colonic polyps   . Diverticulosis   . Esophageal stricture   . Hemorrhoids   . PMB (postmenopausal bleeding)   . Tachycardia   . Cancer     skin- right shoulder-Squamous cell-legs  . Osteopenia    Past Surgical History  Procedure Laterality Date  . Cataract extraction    . Nasal sinus surgery    . Shoulder surgery    . Tubal ligation  1959  . Hysteroscopy    . Dilation and curettage of uterus     Family History  Problem Relation Age of Onset  . Cancer Mother     bone  . Heart disease Brother   . Diabetes Brother   . Heart disease Brother   . Diabetes Brother    History  Substance Use Topics  . Smoking status: Former Games developer  . Smokeless tobacco: Not on file  . Alcohol Use: Yes     Comment: occas   OB History   Grav Para Term Preterm Abortions TAB SAB Ect Mult Living   4 4 4       4      Review of Systems   Constitutional: Positive for diaphoresis. Negative for fever and chills.  Respiratory: Negative for cough and shortness of breath.   Gastrointestinal: Negative for nausea, vomiting, diarrhea and constipation.  Genitourinary: Negative for dysuria, urgency and frequency.  Neurological: Positive for dizziness. Negative for syncope, speech difficulty, weakness and numbness.  All other systems reviewed and are negative.    Allergies  Procaine hcl  Home Medications   Current Outpatient Rx  Name  Route  Sig  Dispense  Refill  . aspirin 81 MG tablet   Oral   Take 81 mg by mouth every Monday, Wednesday, and Friday. Take three times weekly         . calcium carbonate (TUMS) 500 MG chewable tablet   Oral   Chew 1 tablet by mouth 3 (three) times daily.          . cholecalciferol (VITAMIN D) 1000 UNITS tablet   Oral   Take 1,000 Units by mouth daily.           Marland Kitchen donepezil (ARICEPT) 10 MG tablet   Oral   Take 1 tablet (10 mg total) by mouth at bedtime.   90 tablet   1   .  fluticasone (FLONASE) 50 MCG/ACT nasal spray   Nasal   Place 2 sprays into the nose daily.   16 g   3   . furosemide (LASIX) 20 MG tablet   Oral   Take 1 tablet (20 mg total) by mouth daily.   30 tablet   0   . glucosamine-chondroitin 500-400 MG tablet   Oral   Take 1 tablet by mouth daily.          Marland Kitchen levothyroxine (SYNTHROID, LEVOTHROID) 100 MCG tablet   Oral   Take 100 mcg by mouth daily before breakfast.         . lisinopril (PRINIVIL,ZESTRIL) 20 MG tablet   Oral   Take 20 mg by mouth daily.         Marland Kitchen loratadine (ALLERGY RELIEF) 10 MG tablet   Oral   Take 10 mg by mouth daily.           . meclizine (ANTIVERT) 25 MG tablet   Oral   Take 1 tablet (25 mg total) by mouth 3 (three) times daily as needed for dizziness.   30 tablet   0   . metoprolol (LOPRESSOR) 50 MG tablet   Oral   Take 25 mg by mouth 2 (two) times daily.         . RABEprazole (ACIPHEX) 20 MG tablet   Oral    Take 20 mg by mouth daily.         . rosuvastatin (CRESTOR) 20 MG tablet   Oral   Take 30 mg by mouth every other day.          BP 126/90  Pulse 65  Temp(Src) 98 F (36.7 C) (Oral)  Resp 18  SpO2 98% Physical Exam  Nursing note and vitals reviewed. Constitutional: She is oriented to person, place, and time. She appears well-developed and well-nourished. No distress.  HENT:  Head: Normocephalic and atraumatic.  Eyes: Pupils are equal, Ferguson, and reactive to light.  Neck: Normal range of motion.  Cardiovascular: Normal rate, regular rhythm, normal heart sounds and intact distal pulses.   Pulmonary/Chest: Effort normal. No respiratory distress. She has no wheezes. She exhibits no tenderness.  Abdominal: Soft. Bowel sounds are normal. She exhibits no distension. There is no tenderness. There is no rebound and no guarding.  Neurological: She is alert and oriented to person, place, and time. She has normal strength. No cranial nerve deficit or sensory deficit. She exhibits normal muscle tone. Coordination and gait normal.  Skin: Skin is warm and dry.    ED Course  Procedures  Labs Review Labs Reviewed  COMPREHENSIVE METABOLIC PANEL - Abnormal; Notable for the following:    GFR calc non Af Amer 60 (*)    GFR calc Af Amer 70 (*)    All other components within normal limits  URINALYSIS, ROUTINE W REFLEX MICROSCOPIC - Abnormal; Notable for the following:    Ketones, ur 15 (*)    Leukocytes, UA TRACE (*)    All other components within normal limits  CBC WITH DIFFERENTIAL  PROTIME-INR  TROPONIN I  ETHANOL  APTT  URINE RAPID DRUG SCREEN (HOSP PERFORMED)  URINE MICROSCOPIC-ADD ON   Imaging Review Dg Chest 2 View  04/28/2013   CLINICAL DATA:  Dizziness.  EXAM: CHEST  2 VIEW  COMPARISON:  February 22, 2007.  FINDINGS: The heart size and mediastinal contours are within normal limits. Both lungs are clear. The visualized skeletal structures are unremarkable.  IMPRESSION: No active  cardiopulmonary  disease.   Electronically Signed   By: Roque Lias M.D.   On: 04/28/2013 20:55   Ct Head Wo Contrast  04/28/2013   CLINICAL DATA:  Dizziness.  EXAM: CT HEAD WITHOUT CONTRAST  TECHNIQUE: Contiguous axial images were obtained from the base of the skull through the vertex without intravenous contrast.  COMPARISON:  05/06/2012  FINDINGS: Patchy mucoperiosteal thickening in the visualized right sphenoid maxillary sinus. Diffuse parenchymal atrophy. Patchy areas of hypoattenuation in deep and periventricular white matter bilaterally. Negative for acute intracranial hemorrhage, mass lesion, acute infarction, midline shift, or mass-effect. Acute infarct may be in apparent on noncontrast CT. Ventricles and sulci symmetric. Bone windows demonstrate no focal lesion. :  IMPRESSION: Negative for bleed or other acute intracranial process.  Atrophy and nonspecific white matter changes   Electronically Signed   By: Oley Balm M.D.   On: 04/28/2013 21:51    EKG Interpretation   None       MDM  Robin Ferguson is an 79 year old Caucasian female with past medical history of hypertension and hyperlipidemia who comes to the emergency department today with an episode of dizziness. Physical exam as above. History is concerning for a TIA versus peripheral vertigo. The incident occurring with no sudden movements of the head is concerning for a TIA. Initial workup included a UDS, UA, ethanol, a PTT, CBC, CMP, INR, troponin, EKG, chest x-ray, and CT of her head. Results are unremarkable. Please see above for pertinent findings.  Robin Ferguson was felt to require admission to the hospital for TIA and evaluation. She was admitted to the hospitalist service in stable condition. Labs imaging review by myself and considered and medical decision-making. Imaging was interpreted radiology. Care was discussed with my attending Dr. Oletta Lamas.  1. Dizziness   2. Diaphoresis        Bethann Berkshire, MD 04/28/13 [phone]

## 2013-04-28 NOTE — ED Notes (Signed)
Patient awoke this evening after nap diaphoretic and dizzy. Was seen at urgent care, but all symptoms have resolved. Patient explains that she felt dizzy with walking. No pain reported. No new medications reported. Currently feels well, but wants to know what was going on.

## 2013-04-28 NOTE — Progress Notes (Signed)
[facility] [address], [place] [address] [phone]  Date:  04/28/2013   Name:  LALIA LOUDON   DOB:  1922/08/11   MRN:  [id number]  PCP:  [name], MD    Chief Complaint: Fatigue and Dizziness   History of Present Illness:  CATHERINE CUBERO is a 79 y.o. very pleasant female patient who presents with the following:  30 minutes ago awoke from a nap very sweaty.  After she got up, she found she was dizzy, fatigued, and unsteady on her feet, unable to walk without assistance.  No chest pain, tightness, or heaviness.  No nausea or vomiting.  No wheezing or shortness of breath.  Has no weakness, facial droop, aphasia, visual complaints.  Denies any cough, coryza, antecedent illness.  Taking medication.  No improvement with over the counter medications or other home remedies. Denies other complaint or health concern today.   Patient Active Problem List   Diagnosis Date Noted  . Impetigo any site 01/24/2012  . Memory loss 12/26/2011  . Osteopenia   . DIVERTICULOSIS OF COLON 01/11/2008  . PERSONAL HISTORY OF COLONIC POLYPS 01/11/2008  . LEFT BUNDLE BRANCH BLOCK 04/23/2007  . HYPERLIPIDEMIA 04/11/2007  . HYPOTHYROIDISM 01/16/2007  . HYPERTENSION 01/16/2007  . ASTHMA 01/16/2007  . SKIN CANCER, HX OF 01/16/2007    Past Medical History  Diagnosis Date  . Allergy     rhinitis  . Asthma   . Hypertension   . Hypothyroidism   . Hyperlipidemia   . Hx of adenomatous colonic polyps   . Diverticulosis   . Esophageal stricture   . Hemorrhoids   . PMB (postmenopausal bleeding)   . Tachycardia   . Cancer     skin- right shoulder-Squamous cell-legs  . Osteopenia     Past Surgical History  Procedure Laterality Date  . Cataract extraction    . Nasal sinus surgery    . Shoulder surgery    . Tubal ligation  1959  . Hysteroscopy    . Dilation and curettage of uterus      History  Substance Use Topics  . Smoking status: Former Games developer  .  Smokeless tobacco: Not on file  . Alcohol Use: Yes     Comment: occas    Family History  Problem Relation Age of Onset  . Cancer Mother     bone  . Heart disease Brother   . Diabetes Brother   . Heart disease Brother   . Diabetes Brother     Allergies  Allergen Reactions  . Procaine Hcl     REACTION: heart palpitationbs    Medication list has been reviewed and updated.  Current Outpatient Prescriptions on File Prior to Visit  Medication Sig Dispense Refill  . aspirin 81 MG tablet Take 81 mg by mouth every Monday, Wednesday, and Friday. Take three times weekly      . calcium carbonate (TUMS) 500 MG chewable tablet Chew 1 tablet by mouth every Monday, Wednesday, and Friday.       . cholecalciferol (VITAMIN D) 1000 UNITS tablet Take 1,000 Units by mouth daily.        Marland Kitchen donepezil (ARICEPT) 10 MG tablet Take 1 tablet (10 mg total) by mouth at bedtime.  90 tablet  1  . fluticasone (FLONASE) 50 MCG/ACT nasal spray Place 2 sprays into the nose daily.  16 g  3  . furosemide (LASIX) 20 MG tablet Take 1 tablet (20 mg total) by mouth  daily.  30 tablet  0  . glucosamine-chondroitin 500-400 MG tablet Take 1 tablet by mouth daily.       Marland Kitchen levothyroxine (SYNTHROID, LEVOTHROID) 100 MCG tablet TAKE ONE TABLET BY MOUTH ONCE DAILY  30 tablet  5  . lisinopril (PRINIVIL,ZESTRIL) 20 MG tablet TAKE ONE TABLET BY MOUTH EVERY DAY  90 tablet  0  . loratadine (ALLERGY RELIEF) 10 MG tablet Take 10 mg by mouth daily.        . meclizine (ANTIVERT) 25 MG tablet Take 1 tablet (25 mg total) by mouth 3 (three) times daily as needed for dizziness.  30 tablet  0  . metoprolol (LOPRESSOR) 50 MG tablet TAKE ONE-HALF TABLET BY MOUTH TWICE DAILY  30 tablet  5  . RABEprazole (ACIPHEX) 20 MG tablet TAKE ONE TABLET BY MOUTH ONCE DAILY.  30 tablet  0  . RABEprazole (ACIPHEX) 20 MG tablet TAKE ONE TABLET BY MOUTH ONCE DAILY.  30 tablet  0  . rosuvastatin (CRESTOR) 20 MG tablet Take 1.5 tablets (30 mg total) by mouth every  other day.  90 tablet  3  . amoxicillin (AMOXIL) 500 MG capsule Take 1 capsule (500 mg total) by mouth 3 (three) times daily.  30 capsule  0  . mupirocin cream (BACTROBAN) 2 % Apply topically 3 (three) times daily.  15 g  0   No current facility-administered medications on file prior to visit.    Review of Systems:  As per HPI, otherwise negative.    Physical Examination: Filed Vitals:   04/28/13 1736  BP: 98/60  Pulse: 61  Temp: 97.9 F (36.6 C)  Resp: 16   Filed Vitals:   04/28/13 1736  Height: 5\' 1"  (1.549 m)  Weight: 138 lb 3.2 oz (62.687 kg)   Body mass index is 26.13 kg/(m^2). Ideal Body Weight: Weight in (lb) to have BMI = 25: 132  GEN: WDWN, NAD, Non-toxic, A & O x 3 HEENT: Atraumatic, Normocephalic. Neck supple. No masses, No LAD. Ears and Nose: No external deformity. CV: RRR, No G/R. No JVD. No thrill. No extra heart sounds. Has a 3/6 blowing murmur PULM: CTA B, no wheezes, crackles, rhonchi. No retractions. No resp. distress. No accessory muscle use. ABD: S, NT, ND, +BS. No rebound. No HSM. EXTR: No c/c/e NEURO Normal gait.  PSYCH: Normally interactive. Conversant. Not depressed or anxious appearing.  Calm demeanor.    Assessment and Plan: Mental status change EKG unchanged To ER   Signed,  Phillips Odor, MD

## 2013-04-28 NOTE — ED Notes (Signed)
Patient in Xray

## 2013-04-28 NOTE — H&P (Signed)
PCP:  [name], MD    Chief Complaint:  fall HPI: Robin Ferguson is a 79 y.o. female   has a past medical history of Allergy; Asthma; Hypertension; Hypothyroidism; Hyperlipidemia; adenomatous colonic polyps; Diverticulosis; Esophageal stricture; Hemorrhoids; PMB (postmenopausal bleeding); Tachycardia; Cancer; and Osteopenia.   Presented with   Earlier today she was walking out of cafeteria she twisted an ankle and fell.Today around 2 pm she woke up from a nap and got up to see her partner. While talking she starated to perspire severely. She denies any vertigo. But sates she didn't feel right when she was walking cannot specify any further. Denies any chest pain or shortness of breath. She presented first to Urgent Care and was told to go to ER for TIA work up. In ER CT scan of the head was negative, CXR and ECG unremarkable. She has some trouble finding words for complex concepts. Hospitalist called for admission and further evaluation.    Review of Systems:    Pertinent positives include: perspiration. Word finding difficulty, gait abnormality,  Constitutional:  No weight loss, night sweats, Fevers, chills, fatigue, weight loss  HEENT:  No headaches, Difficulty swallowing,Tooth/dental problems,Sore throat,  No sneezing, itching, ear ache, nasal congestion, post nasal drip,  Cardio-vascular:  No chest pain, Orthopnea, PND, anasarca, dizziness, palpitations.no Bilateral lower extremity swelling  GI:  No heartburn, indigestion, abdominal pain, nausea, vomiting, diarrhea, change in bowel habits, loss of appetite, melena, blood in stool, hematemesis Resp:  no shortness of breath at rest. No dyspnea on exertion, No excess mucus, no productive cough, No non-productive cough, No coughing up of blood.No change in color of mucus.No wheezing. Skin:  no rash or lesions. No jaundice GU:  no dysuria, change in color of urine, no urgency or frequency. No straining to urinate.  No flank  pain.  Musculoskeletal:  No joint pain or no joint swelling. No decreased range of motion. No back pain.  Psych:  No change in mood or affect. No depression or anxiety. No memory loss.  Neuro: no localizing neurological complaints, no tingling, no weakness, no double vision, no  no slurred speech, no confusion  Otherwise ROS are negative except for above, 10 systems were reviewed  Past Medical History: Past Medical History  Diagnosis Date  . Allergy     rhinitis  . Asthma   . Hypertension   . Hypothyroidism   . Hyperlipidemia   . Hx of adenomatous colonic polyps   . Diverticulosis   . Esophageal stricture   . Hemorrhoids   . PMB (postmenopausal bleeding)   . Tachycardia   . Cancer     skin- right shoulder-Squamous cell-legs  . Osteopenia    Past Surgical History  Procedure Laterality Date  . Cataract extraction    . Nasal sinus surgery    . Shoulder surgery    . Tubal ligation  1959  . Hysteroscopy    . Dilation and curettage of uterus       Medications: Prior to Admission medications   Medication Sig Start Date End Date Taking? Authorizing Provider  aspirin 81 MG tablet Take 81 mg by mouth every Monday, Wednesday, and Friday. Take three times weekly   Yes Historical Provider, MD  calcium carbonate (TUMS) 500 MG chewable tablet Chew 1 tablet by mouth 3 (three) times daily.    Yes Historical Provider, MD  cholecalciferol (VITAMIN D) 1000 UNITS tablet Take 1,000 Units by mouth daily.     Yes Historical Provider, MD  donepezil (  ARICEPT) 10 MG tablet Take 1 tablet (10 mg total) by mouth at bedtime. 01/03/13 01/03/14 Yes Bruce Romilda Garret, MD  fluticasone (FLONASE) 50 MCG/ACT nasal spray Place 2 sprays into the nose daily. 05/01/12  Yes Terressa Koyanagi, DO  furosemide (LASIX) 20 MG tablet Take 1 tablet (20 mg total) by mouth daily. 08/29/12  Yes Baker Pierini, FNP  glucosamine-chondroitin 500-400 MG tablet Take 1 tablet by mouth daily.    Yes Historical Provider, MD   levothyroxine (SYNTHROID, LEVOTHROID) 100 MCG tablet Take 100 mcg by mouth daily before breakfast.   Yes Historical Provider, MD  lisinopril (PRINIVIL,ZESTRIL) 20 MG tablet Take 20 mg by mouth daily.   Yes Historical Provider, MD  loratadine (ALLERGY RELIEF) 10 MG tablet Take 10 mg by mouth daily.     Yes Historical Provider, MD  meclizine (ANTIVERT) 25 MG tablet Take 1 tablet (25 mg total) by mouth 3 (three) times daily as needed for dizziness. 10/16/12  Yes Lindley Magnus, MD  metoprolol (LOPRESSOR) 50 MG tablet Take 25 mg by mouth 2 (two) times daily.   Yes Historical Provider, MD  RABEprazole (ACIPHEX) 20 MG tablet Take 20 mg by mouth daily.   Yes Historical Provider, MD  rosuvastatin (CRESTOR) 20 MG tablet Take 30 mg by mouth every other day.   Yes Historical Provider, MD    Allergies:   Allergies  Allergen Reactions  . Procaine Hcl     REACTION: heart palpitationbs    Social History:  Ambulatory   independently   Lives at  home   reports that she has quit smoking. She does not have any smokeless tobacco history on file. She reports that she drinks alcohol. She reports that she does not use illicit drugs.   Family History: family history includes Cancer in her mother; Diabetes in her brother and brother; Heart disease in her brother and brother.    Physical Exam: Patient Vitals for the past 24 hrs:  BP Temp Temp src Pulse Resp SpO2  04/28/13 2206 - 98 F (36.7 C) - - - -  04/28/13 2121 - - - 63 - -  04/28/13 2117 123/62 mmHg - - 61 - -  04/28/13 2116 161/69 mmHg - - 68 - -  04/28/13 2113 115/93 mmHg - - 61 - -  04/28/13 2000 129/58 mmHg - - 57 18 100 %  04/28/13 1945 126/71 mmHg - - 54 12 100 %  04/28/13 1932 146/54 mmHg 97.9 F (36.6 C) Oral 58 19 100 %  04/28/13 1930 146/54 mmHg - - 55 9 98 %  04/28/13 1918 - - - - - 98 %    1. General:  in No Acute distress 2. Psychological: Alert and  Oriented but seems to have trouble expressing her thoughts 3. Head/ENT:    Moist   Mucous Membranes                          Head Non traumatic, neck supple                          Normal   Dentition 4. SKIN: decreased Skin turgor,  Skin clean Dry and intact no rash 5. Heart: Regular rate and rhythm no Murmur, Rub or gallop 6. Lungs: Clear to auscultation bilaterally, no wheezes or crackles   7. Abdomen: Soft, non-tender, Non distended 8. Lower extremities: no clubbing, cyanosis, or edema 9. Neurologically strength 5  out of 5 in all 4 extremities cranial nerves II through XII intact no pronator drift  10. MSK: Normal range of motion  body mass index is unknown because there is no weight on file.   Labs on Admission:   Recent Labs  04/28/13 2030  NA 141  K 3.7  CL 104  CO2 24  GLUCOSE 88  BUN 16  CREATININE 0.84  CALCIUM 8.7    Recent Labs  04/28/13 2030  AST 19  ALT 15  ALKPHOS 66  BILITOT 0.3  PROT 6.2  ALBUMIN 3.5   No results found for this basename: LIPASE, AMYLASE,  in the last 72 hours  Recent Labs  04/28/13 2030  WBC 8.8  NEUTROABS 5.2  HGB 13.7  HCT 41.0  MCV 88.4  PLT 223    Recent Labs  04/28/13 2030  TROPONINI <0.30   No results found for this basename: TSH, T4TOTAL, FREET3, T3FREE, THYROIDAB,  in the last 72 hours No results found for this basename: VITAMINB12, FOLATE, FERRITIN, TIBC, IRON, RETICCTPCT,  in the last 72 hours No results found for this basename: HGBA1C    The CrCl is unknown because both a height and weight (above a minimum accepted value) are required for this calculation. ABG No results found for this basename: phart, pco2, po2, hco3, tco2, acidbasedef, o2sat     No results found for this basename: DDIMER     Other results:  I have pearsonaly reviewed this: ECG REPORT  Rate: 60  Rhythm: LBBB   UA no evidence of infection  Cultures: No results found for this basename: sdes, specrequest, cult, reptstatus       Radiological Exams on Admission: Dg Chest 2 View  04/28/2013    CLINICAL DATA:  Dizziness.  EXAM: CHEST  2 VIEW  COMPARISON:  February 22, 2007.  FINDINGS: The heart size and mediastinal contours are within normal limits. Both lungs are clear. The visualized skeletal structures are unremarkable.  IMPRESSION: No active cardiopulmonary disease.   Electronically Signed   By: Roque Lias M.D.   On: 04/28/2013 20:55   Ct Head Wo Contrast  04/28/2013   CLINICAL DATA:  Dizziness.  EXAM: CT HEAD WITHOUT CONTRAST  TECHNIQUE: Contiguous axial images were obtained from the base of the skull through the vertex without intravenous contrast.  COMPARISON:  05/06/2012  FINDINGS: Patchy mucoperiosteal thickening in the visualized right sphenoid maxillary sinus. Diffuse parenchymal atrophy. Patchy areas of hypoattenuation in deep and periventricular white matter bilaterally. Negative for acute intracranial hemorrhage, mass lesion, acute infarction, midline shift, or mass-effect. Acute infarct may be in apparent on noncontrast CT. Ventricles and sulci symmetric. Bone windows demonstrate no focal lesion. :  IMPRESSION: Negative for bleed or other acute intracranial process.  Atrophy and nonspecific white matter changes   Electronically Signed   By: Oley Balm M.D.   On: 04/28/2013 21:51    Chart has been reviewed  Assessment/Plan  This is an 79 year old female past history of hypertension who presents with episode of diaphoresis followed by word finding difficulty and perhaps some abnormal gait although she cannot specify.  Present on Admission:  . TIA (transient ischemic attack) -  - will admit based on TIA/CVA protocol, await results of MRA/MRI, Carotid Doppler and Echo, obtain cardiac enzymes,  ECG,Lipid panel, TSH. Order PT/OT evaluation. Will make sure patient is on antiplatelet agent.  Neurology consult if there is any evidence of CVA.     Marland Kitchen HYPERTENSION - continue home and he  .  Dementia - no bed side family available continue Aricept  Prophylaxis: SCD  CODE STATUS:  FULL CODE  Other plan as per orders.  I have spent a total of 55 min on this admission  Nivedita Mirabella 04/28/2013, 11:26 PM

## 2013-04-29 ENCOUNTER — Encounter (HOSPITAL_COMMUNITY): Payer: Self-pay | Admitting: *Deleted

## 2013-04-29 ENCOUNTER — Observation Stay (HOSPITAL_COMMUNITY): Payer: Medicare Other

## 2013-04-29 DIAGNOSIS — R930 Abnormal findings on diagnostic imaging of skull and head, not elsewhere classified: Secondary | ICD-10-CM | POA: Diagnosis not present

## 2013-04-29 DIAGNOSIS — R269 Unspecified abnormalities of gait and mobility: Secondary | ICD-10-CM

## 2013-04-29 DIAGNOSIS — I517 Cardiomegaly: Secondary | ICD-10-CM | POA: Diagnosis not present

## 2013-04-29 DIAGNOSIS — F0390 Unspecified dementia without behavioral disturbance: Secondary | ICD-10-CM

## 2013-04-29 DIAGNOSIS — G459 Transient cerebral ischemic attack, unspecified: Secondary | ICD-10-CM

## 2013-04-29 DIAGNOSIS — I10 Essential (primary) hypertension: Secondary | ICD-10-CM | POA: Diagnosis not present

## 2013-04-29 DIAGNOSIS — R413 Other amnesia: Secondary | ICD-10-CM

## 2013-04-29 DIAGNOSIS — R42 Dizziness and giddiness: Secondary | ICD-10-CM | POA: Diagnosis not present

## 2013-04-29 DIAGNOSIS — G319 Degenerative disease of nervous system, unspecified: Secondary | ICD-10-CM | POA: Diagnosis not present

## 2013-04-29 LAB — TROPONIN I
Troponin I: <0.3 ng/mL (ref <0.30)
Troponin I: <0.3 ng/mL (ref <0.30)

## 2013-04-29 LAB — LIPID PANEL
Cholesterol: 180 mg/dL (ref 0–200)
LDL Cholesterol: 94 mg/dL (ref 0–99)
Total CHOL/HDL Ratio: 3 RATIO
Triglycerides: 131 mg/dL (ref <150)
VLDL: 26 mg/dL (ref 0–40)

## 2013-04-29 MED ORDER — DONEPEZIL HCL 10 MG PO TABS
10.0000 mg | ORAL_TABLET | Freq: Every day | ORAL | Status: DC
  Administered 2013-04-29: 10 mg via ORAL
  Filled 2013-04-29 (×2): qty 1

## 2013-04-29 MED ORDER — ASPIRIN 325 MG PO TABS: 325.0000 mg | ORAL_TABLET | Freq: Every day | ORAL | Status: DC

## 2013-04-29 MED ORDER — LORATADINE 10 MG PO TABS
10.0000 mg | ORAL_TABLET | Freq: Every day | ORAL | Status: DC
  Administered 2013-04-29: 10 mg via ORAL
  Filled 2013-04-29: qty 1

## 2013-04-29 MED ORDER — METOPROLOL TARTRATE 25 MG PO TABS
25.0000 mg | ORAL_TABLET | Freq: Two times a day (BID) | ORAL | Status: DC
  Administered 2013-04-29: 25 mg via ORAL
  Filled 2013-04-29 (×3): qty 1

## 2013-04-29 MED ORDER — LISINOPRIL 20 MG PO TABS
20.0000 mg | ORAL_TABLET | Freq: Every day | ORAL | Status: DC
  Administered 2013-04-29: 20 mg via ORAL
  Filled 2013-04-29: qty 1

## 2013-04-29 MED ORDER — CALCIUM CARBONATE ANTACID 500 MG PO CHEW: 1.0000 | CHEWABLE_TABLET | Freq: Three times a day (TID) | ORAL | Status: DC

## 2013-04-29 MED ORDER — PANTOPRAZOLE SODIUM 40 MG PO TBEC
40.0000 mg | DELAYED_RELEASE_TABLET | Freq: Every day | ORAL | Status: DC
  Administered 2013-04-29: 40 mg via ORAL

## 2013-04-29 MED ORDER — ACETAMINOPHEN 325 MG PO TABS: 650.0000 mg | ORAL_TABLET | ORAL | Status: DC | PRN

## 2013-04-29 MED ORDER — LEVOTHYROXINE SODIUM 100 MCG PO TABS
100.0000 ug | ORAL_TABLET | Freq: Every day | ORAL | Status: DC
  Administered 2013-04-29: 100 ug via ORAL
  Filled 2013-04-29 (×2): qty 1

## 2013-04-29 MED ORDER — SODIUM CHLORIDE 0.9 % IV SOLN
INTRAVENOUS | Status: AC
  Administered 2013-04-29: 02:00:00 via INTRAVENOUS

## 2013-04-29 MED ORDER — ATORVASTATIN CALCIUM 40 MG PO TABS
40.0000 mg | ORAL_TABLET | Freq: Every day | ORAL | Status: DC
  Filled 2013-04-29: qty 1

## 2013-04-29 MED ORDER — ASPIRIN 325 MG PO TABS
325.0000 mg | ORAL_TABLET | Freq: Every day | ORAL | Status: DC
Start: 2013-04-29 — End: 2013-04-29
  Administered 2013-04-29: 325 mg via ORAL
  Filled 2013-04-29: qty 1

## 2013-04-29 NOTE — Progress Notes (Signed)
Patient arrived to room from ED via stretcher without distress or pain.  Patient oriented to room and call bell.  Bed alarm applied.

## 2013-04-29 NOTE — Evaluation (Signed)
Physical Therapy Evaluation Patient Details Name: Robin Ferguson MRN: [id number] DOB: [birth year] Today's Date: 04/29/2013 Time: 1357-1440 PT Time Calculation (min): 43 min  PT Assessment / Plan / Recommendation History of Present Illness  79 y.o. female presenteded with s/p fall with twisted ankle and diaphortic. Pt recommend to come to ER from urgent care.  Clinical Impression  Patient demonstrates deficits in fucntional mobility as indicated below. Pt will benefit from continued skilled PT to address deficits and maximize function. Given history of multiple falls with patient living by herself, I recommend HHPT for balance and safety evaluation in the home.  Will defer to HHPT. Patient and family in agreement.    PT Assessment  All further PT needs can be met in the next venue of care    Follow Up Recommendations  Home health PT;Supervision - Intermittent    Does the patient have the potential to tolerate intense rehabilitation      Barriers to Discharge        Equipment Recommendations  None recommended by PT    Recommendations for Other Services     Frequency      Precautions / Restrictions Precautions Precautions: Fall   Pertinent Vitals/Pain No pain at this time, Vitals assessed SpO2 98% on rm air, BPs noted in Vitals       Mobility  Bed Mobility Bed Mobility: Supine to Sit;Sitting - Scoot to Edge of Bed Supine to Sit: 7: Independent Sitting - Scoot to [place] of Bed: 7: Independent Transfers Transfers: Sit to Stand;Stand to Sit Sit to Stand: 7: Independent Stand to Sit: 7: Independent Ambulation/Gait Ambulation/Gait Assistance: 5: Supervision;4: Min guard Ambulation Distance (Feet): 300 Feet Assistive device: None Ambulation/Gait Assistance Details: some instability noted, not significant LOB, however, pt did demonstrates decreased reaction time Gait Pattern: Step-through pattern;Narrow base of support Gait velocity: WFL for community ambulation General Gait  Details: some instability noted    Exercises     PT Diagnosis: Difficulty walking;Generalized weakness  PT Problem List: Decreased balance;Decreased mobility;Decreased coordination;Decreased cognition    PT Goals(Current goals can be found in the care plan section) Acute Rehab PT Goals Patient Stated Goal: to go home PT Goal Formulation: With patient Time For Goal Achievement: 05/13/13 Potential to Achieve Goals: Good  Visit Information  Last PT Received On: 04/29/13 Assistance Needed: +1 History of Present Illness: 79 y.o. female presented with s/p fall with twisted ankle and diaphortic. Pt recommend to come to ER from urgent care.       Prior Functioning  Home Living Family/patient expects to be discharged to:: Private residence Living Arrangements: Alone Available Help at Discharge: Family Type of Home: Other(Comment) (Condo) Home Access: Level entry Home Layout: One level Home Equipment: Shower seat - built in;Cane - single point Additional Comments: driving,  Prior Function Level of Independence: Independent Communication Communication: Expressive difficulties Dominant Hand: Right    Cognition  Cognition Arousal/Alertness: Awake/alert Behavior During Therapy: WFL for tasks assessed/performed Overall Cognitive Status: Impaired/Different from baseline Area of Impairment: Memory;Safety/judgement;Awareness;Problem solving Memory: Decreased short-term memory Safety/Judgement: Decreased awareness of safety;Decreased awareness of deficits Awareness: Anticipatory Problem Solving: Slow processing General Comments: Patient with very long winded tangents deferring from conversations. Patient with decreased awareness of deficits at this time and thinks it is okay for her to drive.    Extremity/Trunk Assessment Upper Extremity Assessment Upper Extremity Assessment: Defer to OT evaluation Lower Extremity Assessment Lower Extremity Assessment: Generalized weakness Cervical  / Trunk Assessment Cervical / Trunk Assessment: Normal   Balance  Balance Balance Assessed: Yes Standardized Balance Assessment Standardized Balance Assessment: Dynamic Gait Index Dynamic Gait Index Level Surface: Normal Change in Gait Speed: Normal Gait with Horizontal Head Turns: Mild Impairment Gait with Vertical Head Turns: Mild Impairment Gait and Pivot Turn: Mild Impairment Step Over Obstacle: Normal Step Around Obstacles: Normal Steps: Normal Total Score: 21 High Level Balance High Level Balance Activites: Side stepping;Backward walking;Direction changes;Turns;Sudden stops;Head turns High Level Balance Comments: some instability noted with higher level balance activities, min guard  End of Session PT - End of Session Equipment Utilized During Treatment: Gait belt Activity Tolerance: Patient tolerated treatment well Patient left: in chair;with call bell/phone within reach;with family/visitor present Nurse Communication: Mobility status  GP Functional Assessment Tool Used: clinical judgement (DGI) Functional Limitation: Mobility: Walking and moving around Mobility: Walking and Moving Around Current Status (R6045): At least 1 percent but less than 20 percent impaired, limited or restricted Mobility: Walking and Moving Around Goal Status [phone]): At least 1 percent but less than 20 percent impaired, limited or restricted Mobility: Walking and Moving Around Discharge Status [phone]): At least 1 percent but less than 20 percent impaired, limited or restricted   Fabio Asa 04/29/2013, 2:54 PM Charlotte Crumb, PT DPT  [phone]
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
## 2013-04-29 NOTE — Progress Notes (Signed)
Talked to patient with daughter present about home health care choices; patient chose [facility] for HHPT/ST; Stanton Kidney with [facility] called for arrangements; Alexis Goodell [phone]

## 2013-04-29 NOTE — Discharge Summary (Signed)
Physician Discharge Summary  Robin Ferguson MWN:[id number] DOB: Aug 30, 1922 DOA: 04/28/2013  PCP: Robin Petit, MD  Admit date: 04/28/2013 Discharge date: 04/29/2013  Time spent: 35 minutes  Recommendations for Outpatient Follow-up:  1. Please followup on balance issues. Home health services with home PT and speech path were arranged prior to discharge  Discharge Diagnoses:  Active Problems:   HYPERTENSION   TIA (transient ischemic attack)   Dementia   Discharge Condition: Stable/improved  Diet recommendation: Heart healthy  There were no vitals filed for this visit.  History of present illness:   Presented with  Earlier today she was walking out of cafeteria she twisted an ankle and fell.Today around 2 pm she woke up from a nap and got up to see her partner. While talking she starated to perspire severely. She denies any vertigo. But sates she didn't feel right when she was walking cannot specify any further. Denies any chest pain or shortness of breath.  She presented first to Urgent Care and was told to go to ER for TIA work up. In ER CT scan of the head was negative, CXR and ECG unremarkable. She has some trouble finding words for complex concepts. Hospitalist called for admission and further evaluation.   Hospital Course:  Patient is a pleasant 79 year old female with history of hypertension, fall walking out of the criteria yesterday, found to have word finding difficulties, where she initially presented to urgent care and then referred to the emergent apartment at [facility]. Initial lab work included a head CT which not reveal acute intracranial abnormalities. By the following day she reported having significant improvement to where finding difficulties. Physical therapy was consulted, and she was ambulating down the hallway though did have some balance issues. I discussed this with her 2 daughters at bedside who confirmed that at baseline patient does have balance issues  and has had recurrent falls. An MRI of the brain performed on 04/29/2013 showed no acute intracranial abnormality. I suspect symptoms may have been related to TIA. Upon further discussion with patient and her daughters, it appears that she was only taken aspirin twice a week. I recommended that she take aspirin on a daily basis as her daughters reassured me that they would take up on her and provide assistance with her medications. Prior to discharge home health services were set up for home PT and speech path. Because of balance issues, I instructed patient to not drive, as I verbalized my concerns for her to herself and others at danger. She verbalized her understanding and her daughters stated that he can drive her.     Consultations:  Physical therapy  Discharge Exam: Filed Vitals:   04/29/13 1030  BP: 144/52  Pulse: 80  Temp: 98.7 F (37.1 C)  Resp: 16    General: No acute distress she is awake alert and oriented x3, reports feeling better today Cardiovascular: Regular rate and rhythm normal S1-S2 no murmurs or gallops Respiratory: Lungs are clear to auscultation bilaterally no wheezing rhonchi rales Abdomen: Soft nontender nondistended positive bowel sound Extremities no edema Neurological. She has a nonfocal neurologic examination for me, 5 of 5 muscle strength, I do not no slurred speech or facial droop.  Discharge Instructions  Discharge Orders   Future Orders Complete By Expires   Call MD for:  difficulty breathing, headache or visual disturbances  As directed    Call MD for:  persistant nausea and vomiting  As directed    Call MD for:  severe  uncontrolled pain  As directed    Call MD for:  temperature >100.4  As directed    Diet - low sodium heart healthy  As directed    Increase activity slowly  As directed        Medication List         ALLERGY RELIEF 10 MG tablet  Generic drug:  loratadine  Take 10 mg by mouth daily.     aspirin 325 MG tablet  Take 1 tablet  (325 mg total) by mouth daily.     calcium carbonate 500 MG chewable tablet  Commonly known as:  TUMS  Chew 1 tablet (200 mg of elemental calcium total) by mouth 3 (three) times daily.     cholecalciferol 1000 UNITS tablet  Commonly known as:  VITAMIN D  Take 1,000 Units by mouth daily.     donepezil 10 MG tablet  Commonly known as:  ARICEPT  Take 1 tablet (10 mg total) by mouth at bedtime.     fluticasone 50 MCG/ACT nasal spray  Commonly known as:  FLONASE  Place 2 sprays into the nose daily.     furosemide 20 MG tablet  Commonly known as:  LASIX  Take 1 tablet (20 mg total) by mouth daily.     glucosamine-chondroitin 500-400 MG tablet  Take 1 tablet by mouth daily.     levothyroxine 100 MCG tablet  Commonly known as:  SYNTHROID, LEVOTHROID  Take 100 mcg by mouth daily before breakfast.     lisinopril 20 MG tablet  Commonly known as:  PRINIVIL,ZESTRIL  Take 20 mg by mouth daily.     meclizine 25 MG tablet  Commonly known as:  ANTIVERT  Take 1 tablet (25 mg total) by mouth 3 (three) times daily as needed for dizziness.     metoprolol 50 MG tablet  Commonly known as:  LOPRESSOR  Take 25 mg by mouth 2 (two) times daily.     RABEprazole 20 MG tablet  Commonly known as:  ACIPHEX  Take 20 mg by mouth daily.     rosuvastatin 20 MG tablet  Commonly known as:  CRESTOR  Take 30 mg by mouth every other day.       Allergies  Allergen Reactions  . Procaine Hcl     REACTION: heart palpitationbs       Follow-up Information   Follow up with Robin Petit, MD In 1 week.   Specialties:  Internal Medicine, Radiology   Contact information:   [address] [place]ena Flake [place] [address] [phone]        The results of significant diagnostics from this hospitalization (including imaging, microbiology, ancillary and laboratory) are listed below for reference.    Significant Diagnostic Studies: Dg Chest 2 View  04/28/2013   CLINICAL DATA:  Dizziness.  EXAM:  CHEST  2 VIEW  COMPARISON:  February 22, 2007.  FINDINGS: The heart size and mediastinal contours are within normal limits. Both lungs are clear. The visualized skeletal structures are unremarkable.  IMPRESSION: No active cardiopulmonary disease.   Electronically Signed   By: Roque Lias M.D.   On: 04/28/2013 20:55   Ct Head Wo Contrast  04/28/2013   CLINICAL DATA:  Dizziness.  EXAM: CT HEAD WITHOUT CONTRAST  TECHNIQUE: Contiguous axial images were obtained from the base of the skull through the vertex without intravenous contrast.  COMPARISON:  05/06/2012  FINDINGS: Patchy mucoperiosteal thickening in the visualized right sphenoid maxillary sinus. Diffuse parenchymal atrophy. Patchy areas of hypoattenuation  in deep and periventricular white matter bilaterally. Negative for acute intracranial hemorrhage, mass lesion, acute infarction, midline shift, or mass-effect. Acute infarct may be in apparent on noncontrast CT. Ventricles and sulci symmetric. Bone windows demonstrate no focal lesion. :  IMPRESSION: Negative for bleed or other acute intracranial process.  Atrophy and nonspecific white matter changes   Electronically Signed   By: Oley Balm M.D.   On: 04/28/2013 21:51   Mri Brain Without Contrast  04/29/2013   CLINICAL DATA:  Hypertensive hyperlipidemic patient presenting with difficulty walking.  EXAM: MRI HEAD WITHOUT CONTRAST  MRA HEAD WITHOUT CONTRAST  TECHNIQUE: Multiplanar, multiecho pulse sequences of the brain and surrounding structures were obtained without intravenous contrast. Angiographic images of the head were obtained using MRA technique without contrast.  COMPARISON:  04/28/2013 head CT.  FINDINGS: MRI HEAD FINDINGS  No acute infarct.  No intracranial hemorrhage.  Prominent small vessel disease type changes.  Global atrophy without hydrocephalus.  No intracranial mass lesion noted on this unenhanced exam.  Major intracranial vascular structures are patent.  Degenerative changes C1-2  articulation.  Paranasal sinus mucosal thickening most notable inferior right maxillary sinus.  MRA HEAD FINDINGS  Hypoplastic left anterior cerebral artery. Anterior circulation otherwise without medium or large size vessel significant stenosis or occlusion.  Right vertebral artery is dominant. No significant narrowing of the distal vertebral arteries or basilar artery.  Non visualization left anterior inferior cerebral artery.  Left posterior cerebral artery distal branch vessel narrowing and irregularity.  No aneurysm noted.  IMPRESSION: MRI HEAD IMPRESSION  No acute infarct.  Prominent small vessel disease type changes.  Global atrophy without hydrocephalus.  No intracranial mass lesion noted on this unenhanced exam.  Paranasal sinus mucosal thickening most notable inferior right maxillary sinus.  MRA HEAD IMPRESSION  Hypoplastic left anterior cerebral artery.  Non visualization left anterior inferior cerebral artery.  Left posterior cerebral artery distal branch vessel narrowing and irregularity.   Electronically Signed   By: Bridgett Larsson M.D.   On: 04/29/2013 11:01   Mr Maxine Glenn Head/brain Wo Cm  04/29/2013   CLINICAL DATA:  Hypertensive hyperlipidemic patient presenting with difficulty walking.  EXAM: MRI HEAD WITHOUT CONTRAST  MRA HEAD WITHOUT CONTRAST  TECHNIQUE: Multiplanar, multiecho pulse sequences of the brain and surrounding structures were obtained without intravenous contrast. Angiographic images of the head were obtained using MRA technique without contrast.  COMPARISON:  04/28/2013 head CT.  FINDINGS: MRI HEAD FINDINGS  No acute infarct.  No intracranial hemorrhage.  Prominent small vessel disease type changes.  Global atrophy without hydrocephalus.  No intracranial mass lesion noted on this unenhanced exam.  Major intracranial vascular structures are patent.  Degenerative changes C1-2 articulation.  Paranasal sinus mucosal thickening most notable inferior right maxillary sinus.  MRA HEAD FINDINGS   Hypoplastic left anterior cerebral artery. Anterior circulation otherwise without medium or large size vessel significant stenosis or occlusion.  Right vertebral artery is dominant. No significant narrowing of the distal vertebral arteries or basilar artery.  Non visualization left anterior inferior cerebral artery.  Left posterior cerebral artery distal branch vessel narrowing and irregularity.  No aneurysm noted.  IMPRESSION: MRI HEAD IMPRESSION  No acute infarct.  Prominent small vessel disease type changes.  Global atrophy without hydrocephalus.  No intracranial mass lesion noted on this unenhanced exam.  Paranasal sinus mucosal thickening most notable inferior right maxillary sinus.  MRA HEAD IMPRESSION  Hypoplastic left anterior cerebral artery.  Non visualization left anterior inferior cerebral artery.  Left posterior  cerebral artery distal branch vessel narrowing and irregularity.   Electronically Signed   By: Bridgett Larsson M.D.   On: 04/29/2013 11:01    Microbiology: No results found for this or any previous visit (from the past 240 hour(s)).   Labs: Basic Metabolic Panel:  Recent Labs Lab 04/28/13 2030  NA 141  K 3.7  CL 104  CO2 24  GLUCOSE 88  BUN 16  CREATININE 0.84  CALCIUM 8.7   Liver Function Tests:  Recent Labs Lab 04/28/13 2030  AST 19  ALT 15  ALKPHOS 66  BILITOT 0.3  PROT 6.2  ALBUMIN 3.5   No results found for this basename: LIPASE, AMYLASE,  in the last 168 hours No results found for this basename: AMMONIA,  in the last 168 hours CBC:  Recent Labs Lab 04/28/13 2030  WBC 8.8  NEUTROABS 5.2  HGB 13.7  HCT 41.0  MCV 88.4  PLT 223   Cardiac Enzymes:  Recent Labs Lab 04/28/13 2030 04/29/13 0300 04/29/13 0830  TROPONINI <0.30 <0.30 <0.30   BNP: BNP (last 3 results) No results found for this basename: PROBNP,  in the last 8760 hours CBG: No results found for this basename: GLUCAP,  in the last 168 hours     Signed:  Jeralyn Bennett  [facility] 04/29/2013, 3:08 PM

## 2013-04-29 NOTE — Progress Notes (Signed)
*  PRELIMINARY RESULTS* Echocardiogram 2D Echocardiogram has been performed.  Robin Ferguson 04/29/2013, 9:33 AM

## 2013-04-29 NOTE — Progress Notes (Signed)
UR complete.  Makyi Ledo RN, MSN 

## 2013-04-29 NOTE — Progress Notes (Signed)
*  PRELIMINARY RESULTS* Vascular Ultrasound Carotid Duplex (Doppler) has been completed.  Preliminary findings: Right = 40-59% ICA stenosis, mid end of scale. Left = 60-79% ICA stenosis, low end of scale.    Farrel Demark, RDMS, RVT  04/29/2013, 9:31 AM

## 2013-05-01 ENCOUNTER — Other Ambulatory Visit: Payer: Self-pay | Admitting: *Deleted

## 2013-05-01 ENCOUNTER — Telehealth: Payer: Self-pay | Admitting: *Deleted

## 2013-05-01 DIAGNOSIS — Z5189 Encounter for other specified aftercare: Secondary | ICD-10-CM | POA: Diagnosis not present

## 2013-05-01 DIAGNOSIS — I10 Essential (primary) hypertension: Secondary | ICD-10-CM | POA: Diagnosis not present

## 2013-05-01 DIAGNOSIS — Z9181 History of falling: Secondary | ICD-10-CM | POA: Diagnosis not present

## 2013-05-01 DIAGNOSIS — I69920 Aphasia following unspecified cerebrovascular disease: Secondary | ICD-10-CM | POA: Diagnosis not present

## 2013-05-01 DIAGNOSIS — R269 Unspecified abnormalities of gait and mobility: Secondary | ICD-10-CM | POA: Diagnosis not present

## 2013-05-01 DIAGNOSIS — I69993 Ataxia following unspecified cerebrovascular disease: Secondary | ICD-10-CM | POA: Diagnosis not present

## 2013-05-01 NOTE — Telephone Encounter (Signed)
transitional care  Admit date: 04-28-2013 Discharge date:04-29-2013  Discharge diagnoses: Active problems:  hypertension  TIA (transient ischemic attack)  dementia  Discharge condition:stable/improved  Recommendations for out patient follow-out 1.  Please folowup on balance issues. homehealth services with home health services with home PT and speech path were arranged prior to discharge.    Talked with daughter Enzo Montgomery about mom;'s condition. ( I was unable to contact patient at her house and daughter states patient stays at her boyfriends house most of the time.) Her daughter states she is doing fairly good, but still has some confusion at times, but it has improved.  Still has some dizziness.  Daughter is concerned about her mom's nutrition and nutritionist with home health will talk to patient about that. Daughter states mom is compliant with her medication.  Unable to make follow up appointment now, but daughter will call back to make appointment.  Will call back to make follow-up appointment.

## 2013-05-02 DIAGNOSIS — I10 Essential (primary) hypertension: Secondary | ICD-10-CM | POA: Diagnosis not present

## 2013-05-02 DIAGNOSIS — Z5189 Encounter for other specified aftercare: Secondary | ICD-10-CM | POA: Diagnosis not present

## 2013-05-02 DIAGNOSIS — Z9181 History of falling: Secondary | ICD-10-CM | POA: Diagnosis not present

## 2013-05-02 DIAGNOSIS — R269 Unspecified abnormalities of gait and mobility: Secondary | ICD-10-CM | POA: Diagnosis not present

## 2013-05-02 DIAGNOSIS — I69920 Aphasia following unspecified cerebrovascular disease: Secondary | ICD-10-CM | POA: Diagnosis not present

## 2013-05-02 DIAGNOSIS — I69993 Ataxia following unspecified cerebrovascular disease: Secondary | ICD-10-CM | POA: Diagnosis not present

## 2013-05-07 DIAGNOSIS — I69993 Ataxia following unspecified cerebrovascular disease: Secondary | ICD-10-CM | POA: Diagnosis not present

## 2013-05-07 DIAGNOSIS — Z5189 Encounter for other specified aftercare: Secondary | ICD-10-CM | POA: Diagnosis not present

## 2013-05-07 DIAGNOSIS — Z9181 History of falling: Secondary | ICD-10-CM | POA: Diagnosis not present

## 2013-05-07 DIAGNOSIS — I69920 Aphasia following unspecified cerebrovascular disease: Secondary | ICD-10-CM | POA: Diagnosis not present

## 2013-05-07 DIAGNOSIS — I10 Essential (primary) hypertension: Secondary | ICD-10-CM | POA: Diagnosis not present

## 2013-05-07 DIAGNOSIS — R269 Unspecified abnormalities of gait and mobility: Secondary | ICD-10-CM | POA: Diagnosis not present

## 2013-05-08 DIAGNOSIS — I10 Essential (primary) hypertension: Secondary | ICD-10-CM | POA: Diagnosis not present

## 2013-05-08 DIAGNOSIS — I69920 Aphasia following unspecified cerebrovascular disease: Secondary | ICD-10-CM | POA: Diagnosis not present

## 2013-05-08 DIAGNOSIS — R269 Unspecified abnormalities of gait and mobility: Secondary | ICD-10-CM | POA: Diagnosis not present

## 2013-05-08 DIAGNOSIS — I69993 Ataxia following unspecified cerebrovascular disease: Secondary | ICD-10-CM | POA: Diagnosis not present

## 2013-05-08 DIAGNOSIS — Z5189 Encounter for other specified aftercare: Secondary | ICD-10-CM | POA: Diagnosis not present

## 2013-05-08 DIAGNOSIS — Z9181 History of falling: Secondary | ICD-10-CM | POA: Diagnosis not present

## 2013-05-09 DIAGNOSIS — R269 Unspecified abnormalities of gait and mobility: Secondary | ICD-10-CM | POA: Diagnosis not present

## 2013-05-09 DIAGNOSIS — I69920 Aphasia following unspecified cerebrovascular disease: Secondary | ICD-10-CM | POA: Diagnosis not present

## 2013-05-09 DIAGNOSIS — I10 Essential (primary) hypertension: Secondary | ICD-10-CM | POA: Diagnosis not present

## 2013-05-09 DIAGNOSIS — I69993 Ataxia following unspecified cerebrovascular disease: Secondary | ICD-10-CM | POA: Diagnosis not present

## 2013-05-09 DIAGNOSIS — Z5189 Encounter for other specified aftercare: Secondary | ICD-10-CM | POA: Diagnosis not present

## 2013-05-09 DIAGNOSIS — Z9181 History of falling: Secondary | ICD-10-CM | POA: Diagnosis not present

## 2013-05-13 ENCOUNTER — Other Ambulatory Visit: Payer: Self-pay | Admitting: Internal Medicine

## 2013-05-13 ENCOUNTER — Ambulatory Visit (INDEPENDENT_AMBULATORY_CARE_PROVIDER_SITE_OTHER): Payer: Medicare Other | Admitting: Family

## 2013-05-13 ENCOUNTER — Other Ambulatory Visit: Payer: Self-pay | Admitting: Family

## 2013-05-13 ENCOUNTER — Encounter: Payer: Self-pay | Admitting: Family

## 2013-05-13 VITALS — BP 106/74 | HR 79 | Wt 143.0 lb

## 2013-05-13 DIAGNOSIS — G459 Transient cerebral ischemic attack, unspecified: Secondary | ICD-10-CM | POA: Diagnosis not present

## 2013-05-13 DIAGNOSIS — I658 Occlusion and stenosis of other precerebral arteries: Secondary | ICD-10-CM

## 2013-05-13 DIAGNOSIS — I6529 Occlusion and stenosis of unspecified carotid artery: Secondary | ICD-10-CM | POA: Insufficient documentation

## 2013-05-13 DIAGNOSIS — R5381 Other malaise: Secondary | ICD-10-CM | POA: Diagnosis not present

## 2013-05-13 DIAGNOSIS — I6523 Occlusion and stenosis of bilateral carotid arteries: Secondary | ICD-10-CM

## 2013-05-13 LAB — BASIC METABOLIC PANEL
BUN: 15 mg/dL (ref 6–23)
Calcium: 9.3 mg/dL (ref 8.4–10.5)
Chloride: 105 mEq/L (ref 96–112)
Creatinine, Ser: 0.9 mg/dL (ref 0.4–1.2)
Glucose, Bld: 90 mg/dL (ref 70–99)

## 2013-05-13 LAB — CBC WITH DIFFERENTIAL/PLATELET
Basophils Relative: 1.3 % (ref 0.0–3.0)
Eosinophils Absolute: 0.2 10*3/uL (ref 0.0–0.7)
Eosinophils Relative: 3 % (ref 0.0–5.0)
Lymphocytes Relative: 36.8 % (ref 12.0–46.0)
MCHC: 33.6 g/dL (ref 30.0–36.0)
MCV: 88.4 fl (ref 78.0–100.0)
Monocytes Absolute: 0.6 10*3/uL (ref 0.1–1.0)
Neutrophils Relative %: 50.4 % (ref 43.0–77.0)
Platelets: 286 10*3/uL (ref 150.0–400.0)
RBC: 4.56 Mil/uL (ref 3.87–5.11)
WBC: 6.9 10*3/uL (ref 4.5–10.5)

## 2013-05-13 NOTE — Patient Instructions (Signed)
Stroke Prevention Some medical conditions and behaviors are associated with an increased chance of having a stroke. You may prevent a stroke by making healthy choices and managing medical conditions. Reduce your risk of having a stroke by:  Staying physically active. Get at least 30 minutes of activity on most or all days.  Not smoking. It may also be helpful to avoid exposure to secondhand smoke.  Limiting alcohol use. Moderate alcohol use is considered to be:  No more than 2 drinks per day for men.  No more than 1 drink per day for nonpregnant women.  Eating healthy foods.  Include 5 or more servings of fruits and vegetables a day.  Certain diets may be prescribed to address high blood pressure, high cholesterol, diabetes, or obesity.  Managing your cholesterol levels.  A low-saturated fat, low-trans fat, low-cholesterol, and high-fiber diet may control cholesterol levels.  Take any prescribed medicines to control cholesterol as directed by your caregiver.  Managing your diabetes.  A controlled-carbohydrate, controlled-sugar diet is recommended to manage diabetes.  Take any prescribed medicines to control diabetes as directed by your caregiver.  Controlling your high blood pressure (hypertension).  A low-salt (sodium), low-saturated fat, low-trans fat, and low-cholesterol diet is recommended to manage high blood pressure.  Take any prescribed medicines to control hypertension as directed by your caregiver.  Maintaining a healthy weight.  A reduced-calorie, low-sodium, low-saturated fat, low-trans fat, low-cholesterol diet is recommended to manage weight.  Stopping drug abuse.  Avoiding birth control pills.  Talk to your caregiver about the risks of taking birth control pills if you are over 35 years old, smoke, get migraines, or have ever had a blood clot.  Getting evaluated for sleep disorders (sleep apnea).  Talk to your caregiver about getting a sleep evaluation  if you snore a lot or have excessive sleepiness.  Taking medicines as directed by your caregiver.  For some people, aspirin or blood thinners (anticoagulants) are helpful in reducing the risk of forming abnormal blood clots that can lead to stroke. If you have the irregular heart rhythm of atrial fibrillation, you should be on a blood thinner unless there is a good reason you cannot take them.  Understand all your medicine instructions. SEEK IMMEDIATE MEDICAL CARE IF:   You have sudden weakness or numbness of the face, arm, or leg, especially on one side of the body.  You have sudden confusion.  You have trouble speaking (aphasia) or understanding.  You have sudden trouble seeing in one or both eyes.  You have sudden trouble walking.  You have dizziness.  You have a loss of balance or coordination.  You have a sudden, severe headache with no known cause.  You have new chest pain or an irregular heartbeat. Any of these symptoms may represent a serious problem that is an emergency. Do not wait to see if the symptoms will go away. Get medical help right away. Call your local emergency services (911 in [place] Do not drive yourself to the hospital. Document Released: 07/28/2004 Document Revised: 09/12/2011 Document Reviewed: 12/21/2012 [employer] Patient Information 2014 [employer].  

## 2013-05-13 NOTE — Progress Notes (Signed)
Subjective:    Patient ID: Robin Ferguson, female    DOB: [birth year], 79 y.o.   MRN: [id number]  HPI 79 year old white female, nonsmoker, patient of Dr. Lovell Sheehan is in today as a hospital followup. She was hospitalized on 04/28/2013 after she presented to the emergency department with dizziness, impaired speech, weakness and diaphoresis. She was found to have had a TIA. Since that time her symptoms have resolved. She is currently seeing physical therapy and speech therapy and doing well. Continues to have fatigue. Daughter reports that she has crying spells the patient denies any depression. She had a carotid Doppler that showed 40-59% block to the ICA-right and 60-79% block in ICA-left.   Review of Systems  Constitutional: Negative.   HENT: Negative.   Respiratory: Negative.   Cardiovascular: Negative.   Gastrointestinal: Negative.   Endocrine: Negative.   Genitourinary: Negative.   Musculoskeletal: Negative.   Skin: Negative.   Neurological: Negative.   Hematological: Negative.   Psychiatric/Behavioral: Negative.    Past Medical History  Diagnosis Date  . Allergy     rhinitis  . Asthma   . Hypertension   . Hypothyroidism   . Hyperlipidemia   . Hx of adenomatous colonic polyps   . Diverticulosis   . Esophageal stricture   . Hemorrhoids   . PMB (postmenopausal bleeding)   . Tachycardia   . Cancer     skin- right shoulder-Squamous cell-legs  . Osteopenia     History   Social History  . Marital Status: Widowed    Spouse Name: N/A    Number of Children: N/A  . Years of Education: N/A   Occupational History  . Not on file.   Social History Main Topics  . Smoking status: Former Games developer  . Smokeless tobacco: Not on file  . Alcohol Use: Yes     Comment: occas  . Drug Use: No  . Sexual Activity: No   Other Topics Concern  . Not on file   Social History Narrative  . No narrative on file    Past Surgical History  Procedure Laterality Date  . Cataract  extraction    . Nasal sinus surgery    . Shoulder surgery    . Tubal ligation  1959  . Hysteroscopy    . Dilation and curettage of uterus      Family History  Problem Relation Age of Onset  . Cancer Mother     bone  . Heart disease Brother   . Diabetes Brother   . Heart disease Brother   . Diabetes Brother     Allergies  Allergen Reactions  . Procaine Hcl     REACTION: heart palpitationbs    Current Outpatient Prescriptions on File Prior to Visit  Medication Sig Dispense Refill  . aspirin 325 MG tablet Take 1 tablet (325 mg total) by mouth daily.  30 tablet  0  . calcium carbonate (TUMS) 500 MG chewable tablet Chew 1 tablet (200 mg of elemental calcium total) by mouth 3 (three) times daily.  30 tablet  0  . cholecalciferol (VITAMIN D) 1000 UNITS tablet Take 1,000 Units by mouth daily.        Marland Kitchen donepezil (ARICEPT) 10 MG tablet Take 1 tablet (10 mg total) by mouth at bedtime.  90 tablet  1  . fluticasone (FLONASE) 50 MCG/ACT nasal spray Place 2 sprays into the nose daily.  16 g  3  . furosemide (LASIX) 20 MG tablet Take 1 tablet (20  mg total) by mouth daily.  30 tablet  0  . glucosamine-chondroitin 500-400 MG tablet Take 1 tablet by mouth daily.       Marland Kitchen levothyroxine (SYNTHROID, LEVOTHROID) 100 MCG tablet Take 100 mcg by mouth daily before breakfast.      . lisinopril (PRINIVIL,ZESTRIL) 20 MG tablet Take 20 mg by mouth daily.      Marland Kitchen loratadine (ALLERGY RELIEF) 10 MG tablet Take 10 mg by mouth daily.        . meclizine (ANTIVERT) 25 MG tablet Take 1 tablet (25 mg total) by mouth 3 (three) times daily as needed for dizziness.  30 tablet  0  . metoprolol (LOPRESSOR) 50 MG tablet Take 25 mg by mouth 2 (two) times daily.      . RABEprazole (ACIPHEX) 20 MG tablet Take 20 mg by mouth daily.      . rosuvastatin (CRESTOR) 20 MG tablet Take 30 mg by mouth every other day.       No current facility-administered medications on file prior to visit.    BP 106/74  Pulse 79  Wt 143 lb  (64.864 kg)  SpO2 98%chart    Objective:   Physical Exam  Constitutional: She is oriented to person, place, and time. She appears well-developed and well-nourished.  HENT:  Right Ear: External ear normal.  Left Ear: External ear normal.  Nose: Nose normal.  Mouth/Throat: Oropharynx is clear and moist.  Neck: Normal range of motion. Neck supple.  Cardiovascular: Normal rate, regular rhythm and normal heart sounds.   Pulmonary/Chest: Effort normal and breath sounds normal.  Abdominal: Soft. Bowel sounds are normal.  Musculoskeletal: Normal range of motion.  Neurological: She is alert and oriented to person, place, and time.  Skin: Skin is warm and dry.  Psychiatric: She has a normal mood and affect.          Assessment & Plan:  Assessment: 1. Transient ischemic attack 2. Hypertension 3. Hypothyroidism 4. Fatigue  Plan: Continue current therapy. Stroke prevention discussed today. Labs sent will notify patient of the results. Followup with specialist as scheduled. Continue speech and physical therapy.

## 2013-05-14 DIAGNOSIS — I69920 Aphasia following unspecified cerebrovascular disease: Secondary | ICD-10-CM | POA: Diagnosis not present

## 2013-05-14 DIAGNOSIS — R269 Unspecified abnormalities of gait and mobility: Secondary | ICD-10-CM | POA: Diagnosis not present

## 2013-05-14 DIAGNOSIS — Z9181 History of falling: Secondary | ICD-10-CM | POA: Diagnosis not present

## 2013-05-14 DIAGNOSIS — I10 Essential (primary) hypertension: Secondary | ICD-10-CM | POA: Diagnosis not present

## 2013-05-14 DIAGNOSIS — Z5189 Encounter for other specified aftercare: Secondary | ICD-10-CM | POA: Diagnosis not present

## 2013-05-14 DIAGNOSIS — I69993 Ataxia following unspecified cerebrovascular disease: Secondary | ICD-10-CM | POA: Diagnosis not present

## 2013-05-22 DIAGNOSIS — I69993 Ataxia following unspecified cerebrovascular disease: Secondary | ICD-10-CM | POA: Diagnosis not present

## 2013-05-22 DIAGNOSIS — Z5189 Encounter for other specified aftercare: Secondary | ICD-10-CM | POA: Diagnosis not present

## 2013-05-22 DIAGNOSIS — I10 Essential (primary) hypertension: Secondary | ICD-10-CM | POA: Diagnosis not present

## 2013-05-22 DIAGNOSIS — R269 Unspecified abnormalities of gait and mobility: Secondary | ICD-10-CM | POA: Diagnosis not present

## 2013-05-22 DIAGNOSIS — Z9181 History of falling: Secondary | ICD-10-CM | POA: Diagnosis not present

## 2013-05-22 DIAGNOSIS — I69920 Aphasia following unspecified cerebrovascular disease: Secondary | ICD-10-CM | POA: Diagnosis not present

## 2013-06-03 ENCOUNTER — Other Ambulatory Visit: Payer: Self-pay | Admitting: *Deleted

## 2013-06-03 MED ORDER — FUROSEMIDE 20 MG PO TABS: ORAL_TABLET | ORAL | Status: DC

## 2013-06-13 ENCOUNTER — Other Ambulatory Visit: Payer: Self-pay | Admitting: *Deleted

## 2013-06-13 MED ORDER — RABEPRAZOLE SODIUM 20 MG PO TBEC: DELAYED_RELEASE_TABLET | ORAL | Status: DC

## 2013-06-19 DIAGNOSIS — J309 Allergic rhinitis, unspecified: Secondary | ICD-10-CM | POA: Diagnosis not present

## 2013-06-21 DIAGNOSIS — J309 Allergic rhinitis, unspecified: Secondary | ICD-10-CM | POA: Diagnosis not present

## 2013-06-24 ENCOUNTER — Other Ambulatory Visit: Payer: Self-pay | Admitting: Internal Medicine

## 2013-06-24 DIAGNOSIS — J309 Allergic rhinitis, unspecified: Secondary | ICD-10-CM | POA: Diagnosis not present

## 2013-06-26 DIAGNOSIS — J309 Allergic rhinitis, unspecified: Secondary | ICD-10-CM | POA: Diagnosis not present

## 2013-06-29 ENCOUNTER — Other Ambulatory Visit: Payer: Self-pay | Admitting: Internal Medicine

## 2013-07-08 DIAGNOSIS — J309 Allergic rhinitis, unspecified: Secondary | ICD-10-CM | POA: Diagnosis not present

## 2013-07-16 DIAGNOSIS — J309 Allergic rhinitis, unspecified: Secondary | ICD-10-CM | POA: Diagnosis not present

## 2013-07-22 ENCOUNTER — Encounter: Payer: Self-pay | Admitting: Family Medicine

## 2013-07-22 ENCOUNTER — Ambulatory Visit (INDEPENDENT_AMBULATORY_CARE_PROVIDER_SITE_OTHER): Payer: Medicare Other | Admitting: Family Medicine

## 2013-07-22 VITALS — BP 120/62 | HR 60 | Temp 98.6°F | Ht 61.0 in | Wt 145.0 lb

## 2013-07-22 DIAGNOSIS — N390 Urinary tract infection, site not specified: Secondary | ICD-10-CM

## 2013-07-22 DIAGNOSIS — J309 Allergic rhinitis, unspecified: Secondary | ICD-10-CM | POA: Diagnosis not present

## 2013-07-22 LAB — POCT URINALYSIS DIPSTICK
Bilirubin, UA: NEGATIVE
Blood, UA: NEGATIVE
Glucose, UA: NEGATIVE
Ketones, UA: NEGATIVE
NITRITE UA: NEGATIVE
PH UA: 6
Spec Grav, UA: 1.015
Urobilinogen, UA: 0.2

## 2013-07-22 MED ORDER — NITROFURANTOIN MONOHYD MACRO 100 MG PO CAPS: 100.0000 mg | ORAL_CAPSULE | Freq: Two times a day (BID) | ORAL | Status: DC

## 2013-07-22 NOTE — Progress Notes (Signed)
Pre visit review using our clinic review tool, if applicable. No additional management support is needed unless otherwise documented below in the visit note. 

## 2013-07-22 NOTE — Progress Notes (Signed)
   Subjective:    Patient ID: Robin Ferguson, female    DOB: 1923-06-09, 80 y.o.   MRN: [id number]  HPI Here with her daughter for 3 days of increased frequency and urgency of urination. No burning or pain, no nausea or fever. She typically drinks plenty of water.    Review of Systems  Constitutional: Negative.   Genitourinary: Positive for urgency and frequency. Negative for dysuria, hematuria, flank pain, difficulty urinating and pelvic pain.       Objective:   Physical Exam  Constitutional: She appears well-developed and well-nourished. No distress.  Abdominal: Soft. Bowel sounds are normal. She exhibits no distension and no mass. There is no tenderness. There is no rebound and no guarding.          Assessment & Plan:  Early UTI. Start on [place] and await the culture results.

## 2013-07-22 NOTE — Addendum Note (Signed)
Addended by: Aggie Hacker A on: 07/22/2013 05:07 PM   Modules accepted: Orders

## 2013-07-24 LAB — URINE CULTURE: Colony Count: 3000

## 2013-07-26 ENCOUNTER — Ambulatory Visit (INDEPENDENT_AMBULATORY_CARE_PROVIDER_SITE_OTHER): Payer: Medicare Other | Admitting: Family

## 2013-07-26 ENCOUNTER — Ambulatory Visit: Payer: Medicare Other | Admitting: Family

## 2013-07-26 ENCOUNTER — Encounter: Payer: Self-pay | Admitting: Family

## 2013-07-26 VITALS — BP 126/82 | HR 68 | Ht 61.0 in | Wt 146.0 lb

## 2013-07-26 DIAGNOSIS — R350 Frequency of micturition: Secondary | ICD-10-CM

## 2013-07-26 DIAGNOSIS — F0390 Unspecified dementia without behavioral disturbance: Secondary | ICD-10-CM | POA: Diagnosis not present

## 2013-07-26 DIAGNOSIS — N318 Other neuromuscular dysfunction of bladder: Secondary | ICD-10-CM

## 2013-07-26 DIAGNOSIS — M542 Cervicalgia: Secondary | ICD-10-CM

## 2013-07-26 DIAGNOSIS — N3281 Overactive bladder: Secondary | ICD-10-CM

## 2013-07-26 NOTE — Progress Notes (Signed)
Subjective:    Patient ID: Robin Ferguson, female    DOB: 08-26-1922, 80 y.o.   MRN: [id number]  HPI 80 year old white female, nonsmoker, patient of Dr. sources in today with complaints of urinary frequency ongoing for several months. She had a urine culture performed last month that was negative. She denies any burning with urination, blood in the urine, abdominal pain or back pain.  Patient has concerns of left neck and shoulder pain that she describes as annoying rates it a 3/10, worse with movement. Has not taken any medication over-the-counter for relief. And is declining medication today. Does report shes tried chiropractic medicine that has not helped.   Review of Systems  Constitutional: Negative.   Respiratory: Negative.   Cardiovascular: Negative.   Gastrointestinal: Negative.  Negative for diarrhea and constipation.  Genitourinary: Positive for frequency. Negative for urgency, hematuria, vaginal bleeding and pelvic pain.  Musculoskeletal: Positive for arthralgias, neck pain and neck stiffness.       Left shoulder and neck pain  Skin: Negative.   Neurological: Negative.   Hematological: Negative.   Psychiatric/Behavioral: Negative.    Past Medical History  Diagnosis Date  . Allergy     rhinitis  . Asthma   . Hypertension   . Hypothyroidism   . Hyperlipidemia   . Hx of adenomatous colonic polyps   . Diverticulosis   . Esophageal stricture   . Hemorrhoids   . PMB (postmenopausal bleeding)   . Tachycardia   . Cancer     skin- right shoulder-Squamous cell-legs  . Osteopenia     History   Social History  . Marital Status: Widowed    Spouse Name: N/A    Number of Children: N/A  . Years of Education: N/A   Occupational History  . Not on file.   Social History Main Topics  . Smoking status: Former Research scientist (life sciences)  . Smokeless tobacco: Never Used  . Alcohol Use: Yes     Comment: occ  . Drug Use: No  . Sexual Activity: No   Other Topics Concern  . Not on file     Social History Narrative  . No narrative on file    Past Surgical History  Procedure Laterality Date  . Cataract extraction    . Nasal sinus surgery    . Shoulder surgery    . Tubal ligation  1959  . Hysteroscopy    . Dilation and curettage of uterus      Family History  Problem Relation Age of Onset  . Cancer Mother     bone  . Heart disease Brother   . Diabetes Brother   . Heart disease Brother   . Diabetes Brother     Allergies  Allergen Reactions  . Procaine Hcl     REACTION: heart palpitationbs    Current Outpatient Prescriptions on File Prior to Visit  Medication Sig Dispense Refill  . aspirin 325 MG tablet Take 1 tablet (325 mg total) by mouth daily.  30 tablet  0  . calcium carbonate (TUMS) 500 MG chewable tablet Chew 1 tablet (200 mg of elemental calcium total) by mouth 3 (three) times daily.  30 tablet  0  . cholecalciferol (VITAMIN D) 1000 UNITS tablet Take 1,000 Units by mouth daily.        Marland Kitchen donepezil (ARICEPT) 10 MG tablet TAKE ONE TABLET BY MOUTH AT BEDTIME.  90 tablet  1  . fluticasone (FLONASE) 50 MCG/ACT nasal spray Place 2 sprays into the nose daily.  16 g  3  . furosemide (LASIX) 20 MG tablet TAKE ONE TABLET BY MOUTH ONCE DAILY  30 tablet  5  . glucosamine-chondroitin 500-400 MG tablet Take 1 tablet by mouth daily.       Marland Kitchen levothyroxine (SYNTHROID, LEVOTHROID) 100 MCG tablet Take 100 mcg by mouth daily before breakfast.      . lisinopril (PRINIVIL,ZESTRIL) 20 MG tablet TAKE ONE TABLET BY MOUTH ONCE DAILY.  90 tablet  0  . loratadine (ALLERGY RELIEF) 10 MG tablet Take 10 mg by mouth daily.        . meclizine (ANTIVERT) 25 MG tablet Take 1 tablet (25 mg total) by mouth 3 (three) times daily as needed for dizziness.  30 tablet  0  . metoprolol (LOPRESSOR) 50 MG tablet Take 25 mg by mouth 2 (two) times daily.      . RABEprazole (ACIPHEX) 20 MG tablet TAKE ONE TABLET BY MOUTH ONCE DAILY  30 tablet  5  . rosuvastatin (CRESTOR) 20 MG tablet Take 30 mg by  mouth every other day.      . nitrofurantoin, macrocrystal-monohydrate, (MACROBID) 100 MG capsule Take 1 capsule (100 mg total) by mouth 2 (two) times daily.  14 capsule  0   No current facility-administered medications on file prior to visit.    BP 126/82  Pulse 68  Ht 5\' 1"  (1.549 m)  Wt 146 lb (66.225 kg)  BMI 27.60 kg/m2chart    Objective:   Physical Exam  Constitutional: She is oriented to person, place, and time. She appears well-developed and well-nourished.  Neck: Normal range of motion. Neck supple. No thyromegaly present.  Cardiovascular: Normal rate, regular rhythm and normal heart sounds.   Pulmonary/Chest: Effort normal and breath sounds normal.  Abdominal: Soft. Bowel sounds are normal.  Musculoskeletal: Normal range of motion. She exhibits no edema and no tenderness.  Neurological: She is alert and oriented to person, place, and time. She has normal reflexes. No cranial nerve deficit.  Skin: Skin is warm and dry.  Psychiatric: She has a normal mood and affect.          Assessment & Plan:  Assessment: 1. Urinary frequency with a history of dementia. I've advised the benefits versus risks of starting OAB medication. Patient declines medication. 2. Left neck pain-advised over-the-counter Aleve or ibuprofen. Advise any prescription strength relief. 3. Dementia-continue current medications. Call the office with any questions or concerns. Recheck as scheduled, and as needed.

## 2013-07-30 DIAGNOSIS — J309 Allergic rhinitis, unspecified: Secondary | ICD-10-CM | POA: Diagnosis not present

## 2013-08-15 DIAGNOSIS — J309 Allergic rhinitis, unspecified: Secondary | ICD-10-CM | POA: Diagnosis not present

## 2013-08-30 DIAGNOSIS — J309 Allergic rhinitis, unspecified: Secondary | ICD-10-CM | POA: Diagnosis not present

## 2013-09-02 DIAGNOSIS — J309 Allergic rhinitis, unspecified: Secondary | ICD-10-CM | POA: Diagnosis not present

## 2013-09-09 DIAGNOSIS — J309 Allergic rhinitis, unspecified: Secondary | ICD-10-CM | POA: Diagnosis not present

## 2013-09-17 DIAGNOSIS — J309 Allergic rhinitis, unspecified: Secondary | ICD-10-CM | POA: Diagnosis not present

## 2013-09-25 DIAGNOSIS — J309 Allergic rhinitis, unspecified: Secondary | ICD-10-CM | POA: Diagnosis not present

## 2013-10-01 DIAGNOSIS — J309 Allergic rhinitis, unspecified: Secondary | ICD-10-CM | POA: Diagnosis not present

## 2013-10-02 ENCOUNTER — Other Ambulatory Visit: Payer: Self-pay | Admitting: Internal Medicine

## 2013-10-07 DIAGNOSIS — J309 Allergic rhinitis, unspecified: Secondary | ICD-10-CM | POA: Diagnosis not present

## 2013-10-17 DIAGNOSIS — J309 Allergic rhinitis, unspecified: Secondary | ICD-10-CM | POA: Diagnosis not present

## 2013-10-22 DIAGNOSIS — J309 Allergic rhinitis, unspecified: Secondary | ICD-10-CM | POA: Diagnosis not present

## 2013-11-01 ENCOUNTER — Ambulatory Visit (INDEPENDENT_AMBULATORY_CARE_PROVIDER_SITE_OTHER): Payer: Medicare Other | Admitting: Family

## 2013-11-01 ENCOUNTER — Encounter: Payer: Self-pay | Admitting: Family

## 2013-11-01 VITALS — BP 124/68 | HR 92 | Temp 98.5°F | Wt 146.0 lb

## 2013-11-01 DIAGNOSIS — F411 Generalized anxiety disorder: Secondary | ICD-10-CM

## 2013-11-01 DIAGNOSIS — F329 Major depressive disorder, single episode, unspecified: Secondary | ICD-10-CM | POA: Diagnosis not present

## 2013-11-01 DIAGNOSIS — J309 Allergic rhinitis, unspecified: Secondary | ICD-10-CM | POA: Diagnosis not present

## 2013-11-01 MED ORDER — ESCITALOPRAM OXALATE 5 MG PO TABS: 5.0000 mg | ORAL_TABLET | Freq: Every day | ORAL | Status: DC

## 2013-11-01 NOTE — Progress Notes (Signed)
Pre visit review using our clinic review tool, if applicable. No additional management support is needed unless otherwise documented below in the visit note. 

## 2013-11-01 NOTE — Patient Instructions (Signed)

## 2013-11-01 NOTE — Progress Notes (Signed)
Subjective:    Patient ID: Robin Ferguson, female    DOB: 12/24/1922, 80 y.o.   MRN: [id number]  HPI 80 year old white female, nonsmoker then today with her daughter who is concerned about her being depressed and tearful more recently. She has had a loss of independence and is unable to drive. Also has a boyfriend who has been sick more recently. Her daughter reports she's been more irritable.   Review of Systems  Constitutional: Negative.   Respiratory: Negative.   Cardiovascular: Negative.   Gastrointestinal: Negative.   Genitourinary: Negative.   Musculoskeletal: Negative.   Skin: Negative.   Neurological: Negative.   Psychiatric/Behavioral: Positive for agitation. The patient is nervous/anxious.    Past Medical History  Diagnosis Date  . Allergy     rhinitis  . Asthma   . Hypertension   . Hypothyroidism   . Hyperlipidemia   . Hx of adenomatous colonic polyps   . Diverticulosis   . Esophageal stricture   . Hemorrhoids   . PMB (postmenopausal bleeding)   . Tachycardia   . Cancer     skin- right shoulder-Squamous cell-legs  . Osteopenia     History   Social History  . Marital Status: Widowed    Spouse Name: N/A    Number of Children: N/A  . Years of Education: N/A   Occupational History  . Not on file.   Social History Main Topics  . Smoking status: Former Research scientist (life sciences)  . Smokeless tobacco: Never Used  . Alcohol Use: Yes     Comment: occ  . Drug Use: No  . Sexual Activity: No   Other Topics Concern  . Not on file   Social History Narrative  . No narrative on file    Past Surgical History  Procedure Laterality Date  . Cataract extraction    . Nasal sinus surgery    . Shoulder surgery    . Tubal ligation  1959  . Hysteroscopy    . Dilation and curettage of uterus      Family History  Problem Relation Age of Onset  . Cancer Mother     bone  . Heart disease Brother   . Diabetes Brother   . Heart disease Brother   . Diabetes Brother      Allergies  Allergen Reactions  . Procaine Hcl     REACTION: heart palpitationbs    Current Outpatient Prescriptions on File Prior to Visit  Medication Sig Dispense Refill  . aspirin 325 MG tablet Take 1 tablet (325 mg total) by mouth daily.  30 tablet  0  . calcium carbonate (TUMS) 500 MG chewable tablet Chew 1 tablet (200 mg of elemental calcium total) by mouth 3 (three) times daily.  30 tablet  0  . cholecalciferol (VITAMIN D) 1000 UNITS tablet Take 1,000 Units by mouth daily.        Marland Kitchen donepezil (ARICEPT) 10 MG tablet TAKE ONE TABLET BY MOUTH AT BEDTIME.  90 tablet  1  . fluticasone (FLONASE) 50 MCG/ACT nasal spray Place 2 sprays into the nose daily.  16 g  3  . furosemide (LASIX) 20 MG tablet TAKE ONE TABLET BY MOUTH ONCE DAILY  30 tablet  5  . glucosamine-chondroitin 500-400 MG tablet Take 1 tablet by mouth daily.       Marland Kitchen levothyroxine (SYNTHROID, LEVOTHROID) 100 MCG tablet Take 100 mcg by mouth daily before breakfast.      . lisinopril (PRINIVIL,ZESTRIL) 20 MG tablet TAKE ONE TABLET BY  MOUTH ONCE DAILY.  90 tablet  0  . loratadine (ALLERGY RELIEF) 10 MG tablet Take 10 mg by mouth daily.        . meclizine (ANTIVERT) 25 MG tablet Take 1 tablet (25 mg total) by mouth 3 (three) times daily as needed for dizziness.  30 tablet  0  . metoprolol (LOPRESSOR) 50 MG tablet Take 25 mg by mouth 2 (two) times daily.      . nitrofurantoin, macrocrystal-monohydrate, (MACROBID) 100 MG capsule Take 1 capsule (100 mg total) by mouth 2 (two) times daily.  14 capsule  0  . RABEprazole (ACIPHEX) 20 MG tablet TAKE ONE TABLET BY MOUTH ONCE DAILY  30 tablet  5  . rosuvastatin (CRESTOR) 20 MG tablet Take 30 mg by mouth every other day.       No current facility-administered medications on file prior to visit.    BP 124/68  Pulse 92  Temp(Src) 98.5 F (36.9 C) (Oral)  Wt 146 lb (66.225 kg)chart    Objective:   Physical Exam  Constitutional: She is oriented to person, place, and time. She appears  well-developed and well-nourished.  Neck: Normal range of motion. Neck supple.  Cardiovascular: Normal rate, regular rhythm and normal heart sounds.   Pulmonary/Chest: Effort normal and breath sounds normal.  Abdominal: Soft. Bowel sounds are normal.  Musculoskeletal: Normal range of motion.  Neurological: She is alert and oriented to person, place, and time.  Skin: Skin is warm and dry.  Psychiatric: She has a normal mood and affect.          Assessment & Plan:  Yarah was seen today for depression.  Diagnoses and associated orders for this visit:  Major depressive disorder, single episode  Generalized anxiety disorder  Other Orders - escitalopram (LEXAPRO) 5 MG tablet; Take 1 tablet (5 mg total) by mouth daily.   Call the office with any questions or concerns. Recheck as scheduled and as needed.

## 2013-11-04 DIAGNOSIS — J309 Allergic rhinitis, unspecified: Secondary | ICD-10-CM | POA: Diagnosis not present

## 2013-11-12 ENCOUNTER — Other Ambulatory Visit: Payer: Self-pay | Admitting: Internal Medicine

## 2013-11-12 DIAGNOSIS — J309 Allergic rhinitis, unspecified: Secondary | ICD-10-CM | POA: Diagnosis not present

## 2013-11-13 ENCOUNTER — Other Ambulatory Visit: Payer: Self-pay | Admitting: Internal Medicine

## 2013-11-15 DIAGNOSIS — J301 Allergic rhinitis due to pollen: Secondary | ICD-10-CM | POA: Diagnosis not present

## 2013-11-15 DIAGNOSIS — J3089 Other allergic rhinitis: Secondary | ICD-10-CM | POA: Diagnosis not present

## 2013-11-19 DIAGNOSIS — J309 Allergic rhinitis, unspecified: Secondary | ICD-10-CM | POA: Diagnosis not present

## 2013-11-20 DIAGNOSIS — L570 Actinic keratosis: Secondary | ICD-10-CM | POA: Diagnosis not present

## 2013-11-20 DIAGNOSIS — Z85828 Personal history of other malignant neoplasm of skin: Secondary | ICD-10-CM | POA: Diagnosis not present

## 2013-11-20 DIAGNOSIS — D1801 Hemangioma of skin and subcutaneous tissue: Secondary | ICD-10-CM | POA: Diagnosis not present

## 2013-11-20 DIAGNOSIS — L819 Disorder of pigmentation, unspecified: Secondary | ICD-10-CM | POA: Diagnosis not present

## 2013-11-20 DIAGNOSIS — D692 Other nonthrombocytopenic purpura: Secondary | ICD-10-CM | POA: Diagnosis not present

## 2013-11-20 DIAGNOSIS — D485 Neoplasm of uncertain behavior of skin: Secondary | ICD-10-CM | POA: Diagnosis not present

## 2013-11-20 DIAGNOSIS — C44721 Squamous cell carcinoma of skin of unspecified lower limb, including hip: Secondary | ICD-10-CM | POA: Diagnosis not present

## 2013-11-20 DIAGNOSIS — D239 Other benign neoplasm of skin, unspecified: Secondary | ICD-10-CM | POA: Diagnosis not present

## 2013-11-20 DIAGNOSIS — L821 Other seborrheic keratosis: Secondary | ICD-10-CM | POA: Diagnosis not present

## 2013-11-22 DIAGNOSIS — J309 Allergic rhinitis, unspecified: Secondary | ICD-10-CM | POA: Diagnosis not present

## 2013-11-24 ENCOUNTER — Emergency Department (HOSPITAL_COMMUNITY)
Admission: EM | Admit: 2013-11-24 | Discharge: 2013-11-24 | Disposition: A | Discharge status: Home / Self Care | Payer: Medicare Other | Attending: Emergency Medicine | Admitting: Emergency Medicine

## 2013-11-24 ENCOUNTER — Encounter (HOSPITAL_COMMUNITY): Payer: Self-pay | Admitting: Emergency Medicine

## 2013-11-24 DIAGNOSIS — S91009A Unspecified open wound, unspecified ankle, initial encounter: Secondary | ICD-10-CM

## 2013-11-24 DIAGNOSIS — S81009A Unspecified open wound, unspecified knee, initial encounter: Secondary | ICD-10-CM | POA: Diagnosis not present

## 2013-11-24 DIAGNOSIS — Z79899 Other long term (current) drug therapy: Secondary | ICD-10-CM | POA: Diagnosis not present

## 2013-11-24 DIAGNOSIS — Z85828 Personal history of other malignant neoplasm of skin: Secondary | ICD-10-CM | POA: Diagnosis not present

## 2013-11-24 DIAGNOSIS — I10 Essential (primary) hypertension: Secondary | ICD-10-CM | POA: Diagnosis not present

## 2013-11-24 DIAGNOSIS — S81809A Unspecified open wound, unspecified lower leg, initial encounter: Secondary | ICD-10-CM

## 2013-11-24 DIAGNOSIS — Z8742 Personal history of other diseases of the female genital tract: Secondary | ICD-10-CM | POA: Insufficient documentation

## 2013-11-24 DIAGNOSIS — Z8719 Personal history of other diseases of the digestive system: Secondary | ICD-10-CM | POA: Diagnosis not present

## 2013-11-24 DIAGNOSIS — Z23 Encounter for immunization: Secondary | ICD-10-CM | POA: Insufficient documentation

## 2013-11-24 DIAGNOSIS — J45909 Unspecified asthma, uncomplicated: Secondary | ICD-10-CM | POA: Diagnosis not present

## 2013-11-24 DIAGNOSIS — E039 Hypothyroidism, unspecified: Secondary | ICD-10-CM | POA: Insufficient documentation

## 2013-11-24 DIAGNOSIS — Z7982 Long term (current) use of aspirin: Secondary | ICD-10-CM | POA: Insufficient documentation

## 2013-11-24 DIAGNOSIS — E785 Hyperlipidemia, unspecified: Secondary | ICD-10-CM | POA: Diagnosis not present

## 2013-11-24 DIAGNOSIS — IMO0002 Reserved for concepts with insufficient information to code with codable children: Secondary | ICD-10-CM | POA: Diagnosis not present

## 2013-11-24 DIAGNOSIS — Z860100 Personal history of colon polyps, unspecified: Secondary | ICD-10-CM | POA: Insufficient documentation

## 2013-11-24 DIAGNOSIS — Y929 Unspecified place or not applicable: Secondary | ICD-10-CM | POA: Insufficient documentation

## 2013-11-24 DIAGNOSIS — Z8739 Personal history of other diseases of the musculoskeletal system and connective tissue: Secondary | ICD-10-CM | POA: Diagnosis not present

## 2013-11-24 DIAGNOSIS — Z87891 Personal history of nicotine dependence: Secondary | ICD-10-CM | POA: Insufficient documentation

## 2013-11-24 DIAGNOSIS — Y9389 Activity, other specified: Secondary | ICD-10-CM | POA: Insufficient documentation

## 2013-11-24 DIAGNOSIS — T148XXA Other injury of unspecified body region, initial encounter: Secondary | ICD-10-CM

## 2013-11-24 MED ORDER — TETANUS-DIPHTH-ACELL PERTUSSIS 5-2.5-18.5 LF-MCG/0.5 IM SUSP
0.5000 mL | Freq: Once | INTRAMUSCULAR | Status: AC
Start: 2013-11-24 — End: 2013-11-24
  Administered 2013-11-24: 0.5 mL via INTRAMUSCULAR
  Filled 2013-11-24: qty 0.5

## 2013-11-24 NOTE — ED Notes (Signed)
Pt presents with bilat lower extremity pain, discoloration and oozing. Pt states she was attempting to step down off wooden stool and scraped legs on edge of stool @ 1 hour ago.

## 2013-11-24 NOTE — Discharge Instructions (Signed)
Abrasion °An abrasion is a cut or scrape of the skin. Abrasions do not extend through all layers of the skin and most heal within 10 days. It is important to care for your abrasion properly to prevent infection. °CAUSES  °Most abrasions are caused by falling on, or gliding across, the ground or other surface. When your skin rubs on something, the outer and inner layer of skin rubs off, causing an abrasion. °DIAGNOSIS  °Your caregiver will be able to diagnose an abrasion during a physical exam.  °TREATMENT  °Your treatment depends on how large and deep the abrasion is. Generally, your abrasion will be cleaned with water and a mild soap to remove any dirt or debris. An antibiotic ointment may be put over the abrasion to prevent an infection. A bandage (dressing) may be wrapped around the abrasion to keep it from getting dirty.  °You may need a tetanus shot if: °· You cannot remember when you had your last tetanus shot. °· You have never had a tetanus shot. °· The injury broke your skin. °If you get a tetanus shot, your arm may swell, get red, and feel warm to the touch. This is common and not a problem. If you need a tetanus shot and you choose not to have one, there is a rare chance of getting tetanus. Sickness from tetanus can be serious.  °HOME CARE INSTRUCTIONS  °· If a dressing was applied, change it at least once a day or as directed by your caregiver. If the bandage sticks, soak it off with warm water.   °· Wash the area with water and a mild soap to remove all the ointment 2 times a day. Rinse off the soap and pat the area dry with a clean towel.   °· Reapply any ointment as directed by your caregiver. This will help prevent infection and keep the bandage from sticking. Use gauze over the wound and under the dressing to help keep the bandage from sticking.   °· Change your dressing right away if it becomes wet or dirty.   °· Only take over-the-counter or prescription medicines for pain, discomfort, or fever as  directed by your caregiver.   °· Follow up with your caregiver within 24 48 hours for a wound check, or as directed. If you were not given a wound-check appointment, look closely at your abrasion for redness, swelling, or pus. These are signs of infection. °SEEK IMMEDIATE MEDICAL CARE IF:  °· You have increasing pain in the wound.   °· You have redness, swelling, or tenderness around the wound.   °· You have pus coming from the wound.   °· You have a fever or persistent symptoms for more than 2 3 days. °· You have a fever and your symptoms suddenly get worse. °· You have a bad smell coming from the wound or dressing.   °MAKE SURE YOU:  °· Understand these instructions. °· Will watch your condition. °· Will get help right away if you are not doing well or get worse. °Document Released: 03/30/2005 Document Revised: 06/06/2012 Document Reviewed: 05/24/2011 °[employer]® Patient Information ©2014 [employer]. ° °

## 2013-11-24 NOTE — ED Provider Notes (Signed)
CSN: [id number]     Arrival date & time 11/24/13  2112 History   First MD Initiated Contact with Patient 11/24/13 2126     Chief Complaint  Patient presents with  . Leg Pain     (Consider location/radiation/quality/duration/timing/severity/associated sxs/prior Treatment) Patient is a 80 y.o. female presenting with leg pain. The history is provided by the patient.  Leg Pain Location:  Leg Injury: yes   Mechanism of injury comment:  Scraped legs on cabinet Leg location:  L leg and R leg Pain details:    Quality:  Aching   Radiates to:  Does not radiate   Severity:  Mild   Onset quality:  Sudden   Duration:  1 hour   Timing:  Constant   Progression:  Unchanged Chronicity:  New Dislocation: no   Foreign body present:  No foreign bodies Tetanus status:  Unknown Prior injury to area:  No Relieved by:  Nothing Worsened by:  Nothing tried Ineffective treatments:  None tried Associated symptoms: no back pain, no fatigue, no fever and no neck pain     Past Medical History  Diagnosis Date  . Allergy     rhinitis  . Asthma   . Hypertension   . Hypothyroidism   . Hyperlipidemia   . Hx of adenomatous colonic polyps   . Diverticulosis   . Esophageal stricture   . Hemorrhoids   . PMB (postmenopausal bleeding)   . Tachycardia   . Cancer     skin- right shoulder-Squamous cell-legs  . Osteopenia    Past Surgical History  Procedure Laterality Date  . Cataract extraction    . Nasal sinus surgery    . Shoulder surgery    . Tubal ligation  1959  . Hysteroscopy    . Dilation and curettage of uterus     Family History  Problem Relation Age of Onset  . Cancer Mother     bone  . Heart disease Brother   . Diabetes Brother   . Heart disease Brother   . Diabetes Brother    History  Substance Use Topics  . Smoking status: Former Research scientist (life sciences)  . Smokeless tobacco: Never Used  . Alcohol Use: Yes     Comment: occ   OB History   Grav Para Term Preterm Abortions TAB SAB Ect Mult  Living   4 4 4       4      Review of Systems  Constitutional: Negative for fever and fatigue.  HENT: Negative for congestion and drooling.   Eyes: Negative for pain.  Respiratory: Negative for cough and shortness of breath.   Cardiovascular: Negative for chest pain.  Gastrointestinal: Negative for nausea, vomiting, abdominal pain and diarrhea.  Genitourinary: Negative for dysuria and hematuria.  Musculoskeletal: Negative for back pain, gait problem and neck pain.  Skin: Negative for color change.  Neurological: Negative for dizziness and headaches.  Hematological: Negative for adenopathy.  Psychiatric/Behavioral: Negative for behavioral problems.  All other systems reviewed and are negative.     Allergies  Procaine hcl  Home Medications   Prior to Admission medications   Medication Sig Start Date End Date Taking? Authorizing Provider  aspirin 325 MG tablet Take 1 tablet (325 mg total) by mouth daily. 04/29/13   Kelvin Cellar, MD  calcium carbonate (TUMS) 500 MG chewable tablet Chew 1 tablet (200 mg of elemental calcium total) by mouth 3 (three) times daily. 04/29/13   Kelvin Cellar, MD  cholecalciferol (VITAMIN D) 1000 UNITS tablet Take  1,000 Units by mouth daily.      Historical Provider, MD  donepezil (ARICEPT) 10 MG tablet TAKE ONE TABLET BY MOUTH AT BEDTIME. 06/24/13   Lisabeth Pick, MD  escitalopram (LEXAPRO) 5 MG tablet Take 1 tablet (5 mg total) by mouth daily. 11/01/13   Timoteo Gaul, FNP  fluticasone (FLONASE) 50 MCG/ACT nasal spray Place 2 sprays into the nose daily. 05/01/12   Lucretia Kern, DO  furosemide (LASIX) 20 MG tablet TAKE ONE TABLET BY MOUTH ONCE DAILY 06/03/13   Lisabeth Pick, MD  glucosamine-chondroitin 500-400 MG tablet Take 1 tablet by mouth daily.     Historical Provider, MD  levothyroxine (SYNTHROID, LEVOTHROID) 100 MCG tablet TAKE ONE TABLET BY MOUTH ONCE DAILY    Darrick Penna Swords, MD  lisinopril (PRINIVIL,ZESTRIL) 20 MG tablet TAKE ONE TABLET  BY MOUTH ONCE DAILY. 10/02/13   Lisabeth Pick, MD  loratadine (ALLERGY RELIEF) 10 MG tablet Take 10 mg by mouth daily.      Historical Provider, MD  meclizine (ANTIVERT) 25 MG tablet Take 1 tablet (25 mg total) by mouth 3 (three) times daily as needed for dizziness. 10/16/12   Lisabeth Pick, MD  metoprolol (LOPRESSOR) 50 MG tablet TAKE ONE-HALF TABLET BY MOUTH TWICE DAILY 11/12/13   Lisabeth Pick, MD  nitrofurantoin, macrocrystal-monohydrate, (MACROBID) 100 MG capsule Take 1 capsule (100 mg total) by mouth 2 (two) times daily. 07/22/13   Laurey Morale, MD  RABEprazole (ACIPHEX) 20 MG tablet TAKE ONE TABLET BY MOUTH ONCE DAILY 06/13/13   Lisabeth Pick, MD  rosuvastatin (CRESTOR) 20 MG tablet Take 30 mg by mouth every other day.    Historical Provider, MD   BP 121/94  Pulse 58  Temp(Src) 99.1 F (37.3 C) (Oral)  Resp 18  Ht 5\' 4"  (1.626 m)  Wt 143 lb (64.864 kg)  BMI 24.53 kg/m2  SpO2 97% Physical Exam  Nursing note and vitals reviewed. Constitutional: She is oriented to person, place, and time. She appears well-developed and well-nourished.  HENT:  Head: Normocephalic and atraumatic.  Mouth/Throat: Oropharynx is clear and moist. No oropharyngeal exudate.  Eyes: Conjunctivae and EOM are normal. Pupils are equal, round, and reactive to light.  Neck: Normal range of motion. Neck supple.  Cardiovascular: Normal rate, regular rhythm, normal heart sounds and intact distal pulses.  Exam reveals no gallop and no friction rub.   No murmur heard. Pulmonary/Chest: Effort normal and breath sounds normal. No respiratory distress. She has no wheezes.  Abdominal: Soft. Bowel sounds are normal. There is no tenderness. There is no rebound and no guarding.  Musculoskeletal: Normal range of motion. She exhibits no edema and no tenderness.  Mild skin tears to the bilateral mid shins. Mild bloody oozing.  Neurological: She is alert and oriented to person, place, and time.  Skin: Skin is warm and dry.   Psychiatric: She has a normal mood and affect. Her behavior is normal.    ED Course  Procedures (including critical care time) Labs Review Labs Reviewed - No data to display  Imaging Review No results found.   EKG Interpretation None      MDM   Final diagnoses:  Abrasion    9:34 PM 80 y.o. female who presents after she scraped her legs while getting something out of the cabinet. She denies falling or hitting her head. She is unsure of her tetanus is up-to-date. She is afebrile and vital signs are unremarkable here. She has abrasions to  bilateral shins. She is in no acute distress. Will update her tetanus, irrigate wounds, bandage.   10:16 PM: Bleeding controlled.  I have discussed the diagnosis/risks/treatment options with the patient and believe the pt to be eligible for discharge home to follow-up with pcp as needed. Discussed routine wound care. We also discussed returning to the ED immediately if new or worsening sx occur. We discussed the sx which are most concerning (e.g., worsening pain, concern for infection) that necessitate immediate return. Medications administered to the patient during their visit and any new prescriptions provided to the patient are listed below.  Medications given during this visit Medications  Tdap (BOOSTRIX) injection 0.5 mL (0.5 mLs Intramuscular Given 11/24/13 2138)    New Prescriptions   No medications on file     Blanchard Kelch, MD 11/24/13 2312

## 2013-11-26 ENCOUNTER — Encounter: Payer: Self-pay | Admitting: Internal Medicine

## 2013-11-26 ENCOUNTER — Ambulatory Visit (INDEPENDENT_AMBULATORY_CARE_PROVIDER_SITE_OTHER): Payer: Medicare Other | Admitting: Internal Medicine

## 2013-11-26 VITALS — BP 135/70 | HR 56 | Temp 98.3°F | Ht 64.0 in | Wt 148.0 lb

## 2013-11-26 DIAGNOSIS — J45909 Unspecified asthma, uncomplicated: Secondary | ICD-10-CM | POA: Diagnosis not present

## 2013-11-26 DIAGNOSIS — J309 Allergic rhinitis, unspecified: Secondary | ICD-10-CM | POA: Diagnosis not present

## 2013-11-26 DIAGNOSIS — E039 Hypothyroidism, unspecified: Secondary | ICD-10-CM | POA: Diagnosis not present

## 2013-11-26 DIAGNOSIS — E785 Hyperlipidemia, unspecified: Secondary | ICD-10-CM

## 2013-11-26 DIAGNOSIS — I10 Essential (primary) hypertension: Secondary | ICD-10-CM | POA: Diagnosis not present

## 2013-11-26 NOTE — Assessment & Plan Note (Signed)
No sxs- No need for evaluation

## 2013-11-26 NOTE — Assessment & Plan Note (Signed)
Adequately controlled 

## 2013-11-26 NOTE — Progress Notes (Signed)
Pre visit review using our clinic review tool, if applicable. No additional management support is needed unless otherwise documented below in the visit note. 

## 2013-11-26 NOTE — Assessment & Plan Note (Signed)
Controlled 7 months ago Will check in 6 months

## 2013-11-26 NOTE — Progress Notes (Signed)
Climbed on a stool- fell and scraped legs. Reviewed ED notes  She notes some occasional neck pain that seems to radiate to temporal areas. Duration- months- years. No visual change, no jaw claudication  htn- tolerating meds. No home BPs  Hypothyroid-  Lab Results  Component Value Date   TSH 1.95 05/13/2013    Patient moving to [facility] (assisted living)  Lipids- tolerating meds  Past Medical History  Diagnosis Date  . Allergy     rhinitis  . Asthma   . Hypertension   . Hypothyroidism   . Hyperlipidemia   . Hx of adenomatous colonic polyps   . Diverticulosis   . Esophageal stricture   . Hemorrhoids   . PMB (postmenopausal bleeding)   . Tachycardia   . Cancer     skin- right shoulder-Squamous cell-legs  . Osteopenia     History   Social History  . Marital Status: Widowed    Spouse Name: N/A    Number of Children: N/A  . Years of Education: N/A   Occupational History  . Not on file.   Social History Main Topics  . Smoking status: Former Research scientist (life sciences)  . Smokeless tobacco: Never Used  . Alcohol Use: Yes     Comment: occ  . Drug Use: No  . Sexual Activity: No   Other Topics Concern  . Not on file   Social History Narrative  . No narrative on file    Past Surgical History  Procedure Laterality Date  . Cataract extraction    . Nasal sinus surgery    . Shoulder surgery    . Tubal ligation  1959  . Hysteroscopy    . Dilation and curettage of uterus      Family History  Problem Relation Age of Onset  . Cancer Mother     bone  . Heart disease Brother   . Diabetes Brother   . Heart disease Brother   . Diabetes Brother     Allergies  Allergen Reactions  . Procaine Hcl     REACTION: heart palpitationbs    Current Outpatient Prescriptions on File Prior to Visit  Medication Sig Dispense Refill  . calcium carbonate (TUMS) 500 MG chewable tablet Chew 1 tablet (200 mg of elemental calcium total) by mouth 3 (three) times daily.  30 tablet  0  .  donepezil (ARICEPT) 10 MG tablet TAKE ONE TABLET BY MOUTH AT BEDTIME.  90 tablet  1  . escitalopram (LEXAPRO) 5 MG tablet Take 1 tablet (5 mg total) by mouth daily.  30 tablet  1  . fluticasone (FLONASE) 50 MCG/ACT nasal spray Place 2 sprays into the nose daily.  16 g  3  . furosemide (LASIX) 20 MG tablet TAKE ONE TABLET BY MOUTH ONCE DAILY  30 tablet  5  . glucosamine-chondroitin 500-400 MG tablet Take 1 tablet by mouth daily.       Marland Kitchen levothyroxine (SYNTHROID, LEVOTHROID) 100 MCG tablet TAKE ONE TABLET BY MOUTH ONCE DAILY  30 tablet  5  . lisinopril (PRINIVIL,ZESTRIL) 20 MG tablet TAKE ONE TABLET BY MOUTH ONCE DAILY.  90 tablet  0  . loratadine (ALLERGY RELIEF) 10 MG tablet Take 10 mg by mouth daily.        . meclizine (ANTIVERT) 25 MG tablet Take 1 tablet (25 mg total) by mouth 3 (three) times daily as needed for dizziness.  30 tablet  0  . metoprolol (LOPRESSOR) 50 MG tablet TAKE ONE-HALF TABLET BY MOUTH TWICE DAILY  30 tablet  0  . RABEprazole (ACIPHEX) 20 MG tablet TAKE ONE TABLET BY MOUTH ONCE DAILY  30 tablet  5  . rosuvastatin (CRESTOR) 20 MG tablet Take 30 mg by mouth every other day.       No current facility-administered medications on file prior to visit.     patient denies chest pain, shortness of breath, orthopnea. Denies lower extremity edema, abdominal pain, change in appetite, change in bowel movements. Patient denies rashes, musculoskeletal complaints. No other specific complaints in a complete review of systems.   BP 150/74  Pulse 56  Temp(Src) 98.3 F (36.8 C) (Oral)  Ht 5\' 4"  (1.626 m)  Wt 148 lb (67.132 kg)  BMI 25.39 kg/m2 elderly female in no acute distress. HEENT exam atraumatic, normocephalic, extraocular muscles are intact. Neck is supple. No jugular venous distention no thyromegaly. Chest clear to auscultation without increased work of breathing. Cardiac exam S1 and S2 are regular. Abdominal exam active bowel sounds, soft, nontender. Extremities no edema.  Neurologic exam she is alert without any motor sensory deficits.  Skin- skin tears- on bilateral pretibial area.

## 2013-11-26 NOTE — Assessment & Plan Note (Signed)
Reviewed normal tsh from 2014

## 2013-11-27 ENCOUNTER — Telehealth: Payer: Self-pay | Admitting: Internal Medicine

## 2013-11-27 NOTE — Telephone Encounter (Signed)
Relevant patient education mailed to patient.  

## 2013-12-03 ENCOUNTER — Ambulatory Visit (INDEPENDENT_AMBULATORY_CARE_PROVIDER_SITE_OTHER): Payer: Medicare Other | Admitting: Internal Medicine

## 2013-12-03 VITALS — BP 142/82 | HR 66 | Temp 98.2°F | Resp 18 | Ht 61.0 in | Wt 147.6 lb

## 2013-12-03 DIAGNOSIS — S8010XA Contusion of unspecified lower leg, initial encounter: Secondary | ICD-10-CM | POA: Diagnosis not present

## 2013-12-03 DIAGNOSIS — S91009A Unspecified open wound, unspecified ankle, initial encounter: Principal | ICD-10-CM

## 2013-12-03 DIAGNOSIS — S81809A Unspecified open wound, unspecified lower leg, initial encounter: Principal | ICD-10-CM

## 2013-12-03 DIAGNOSIS — S81009A Unspecified open wound, unspecified knee, initial encounter: Secondary | ICD-10-CM | POA: Diagnosis not present

## 2013-12-03 DIAGNOSIS — J309 Allergic rhinitis, unspecified: Secondary | ICD-10-CM | POA: Diagnosis not present

## 2013-12-03 MED ORDER — MUPIROCIN 2 % EX OINT: 1.0000 "application " | TOPICAL_OINTMENT | Freq: Three times a day (TID) | CUTANEOUS | Status: DC

## 2013-12-03 NOTE — Progress Notes (Signed)
   Subjective:    Patient ID: Robin Ferguson, female    DOB: [birth year], 80 y.o.   MRN: [id number]  HPI  80 y.o. Female presents to clinic for wound care. Reports that earlier last week that she had a fall on some wood that caused abrasions to the front of both legs. Reports that she was taken to [facility] ER to be seen for this and given a tetanus shot at that time. Was also told to follow up for care of the wounds   Review of Systems     Objective:   Physical Exam  Constitutional: She is oriented to person, place, and time. She appears well-developed and well-nourished. No distress.  HENT:  Head: Normocephalic.  Eyes: EOM are normal.  Neck: Normal range of motion.  Pulmonary/Chest: Effort normal.  Musculoskeletal: She exhibits no edema and no tenderness.  Neurological: She is alert and oriented to person, place, and time. She exhibits normal muscle tone. Coordination normal.  Skin: Abrasion and laceration noted. No ecchymosis noted. Rash is not pustular. There is erythema.     Healing wounds  Psychiatric: She has a normal mood and affect. Her behavior is normal. Judgment and thought content normal.   Wounds on both shins and back of left calf, covered properly Dressings removed No reddness, tenderness, or purulent discharge. Contusions have resolved.  redressed     Assessment & Plan:  Wound care Fall care

## 2013-12-03 NOTE — Patient Instructions (Addendum)
Wound Care Wound care helps prevent pain and infection.  You may need a tetanus shot if:  You cannot remember when you had your last tetanus shot.  You have never had a tetanus shot.  The injury broke your skin. If you need a tetanus shot and you choose not to have one, you may get tetanus. Sickness from tetanus can be serious. HOME CARE   Only take medicine as told by your doctor.  Clean the wound daily with mild soap and water.  Change any bandages (dressings) as told by your doctor.  Put medicated cream and a bandage on the wound as told by your doctor.  Change the bandage if it gets wet, dirty, or starts to smell.  Take showers. Do not take baths, swim, or do anything that puts your wound under water.  Rest and raise (elevate) the wound until the pain and puffiness (swelling) are better.  Keep all doctor visits as told. GET HELP RIGHT AWAY IF:   Yellowish-white fluid (pus) comes from the wound.  Medicine does not lessen your pain.  There is a red streak going away from the wound.  You have a fever. MAKE SURE YOU:   Understand these instructions.  Will watch your condition.  Will get help right away if you are not doing well or get worse. Document Released: 03/29/2008 Document Revised: 09/12/2011 Document Reviewed: 10/24/2010 [facility] Patient Information 2014 [place], [place]. Fall Prevention and Home Safety Falls cause injuries and can affect all age groups. It is possible to use preventive measures to significantly decrease the likelihood of falls. There are many simple measures which can make your home safer and prevent falls. OUTDOORS  Repair cracks and edges of walkways and driveways.  Remove high doorway thresholds.  Trim shrubbery on the main path into your home.  Have good outside lighting.  Clear walkways of tools, rocks, debris, and clutter.  Check that handrails are not broken and are securely fastened. Both sides of steps should have  handrails.  Have leaves, snow, and ice cleared regularly.  Use sand or salt on walkways during winter months.  In the garage, clean up grease or oil spills. BATHROOM  Install night lights.  Install grab bars by the toilet and in the tub and shower.  Use non-skid mats or decals in the tub or shower.  Place a plastic non-slip stool in the shower to sit on, if needed.  Keep floors dry and clean up all water on the floor immediately.  Remove soap buildup in the tub or shower on a regular basis.  Secure bath mats with non-slip, double-sided rug tape.  Remove throw rugs and tripping hazards from the floors. BEDROOMS  Install night lights.  Make sure a bedside light is easy to reach.  Do not use oversized bedding.  Keep a telephone by your bedside.  Have a firm chair with side arms to use for getting dressed.  Remove throw rugs and tripping hazards from the floor. KITCHEN  Keep handles on pots and pans turned toward the center of the stove. Use back burners when possible.  Clean up spills quickly and allow time for drying.  Avoid walking on wet floors.  Avoid hot utensils and knives.  Position shelves so they are not too high or low.  Place commonly used objects within easy reach.  If necessary, use a sturdy step stool with a grab bar when reaching.  Keep electrical cables out of the way.  Do not use floor polish  or wax that makes floors slippery. If you must use wax, use non-skid floor wax.  Remove throw rugs and tripping hazards from the floor. STAIRWAYS  Never leave objects on stairs.  Place handrails on both sides of stairways and use them. Fix any loose handrails. Make sure handrails on both sides of the stairways are as long as the stairs.  Check carpeting to make sure it is firmly attached along stairs. Make repairs to worn or loose carpet promptly.  Avoid placing throw rugs at the top or bottom of stairways, or properly secure the rug with carpet  tape to prevent slippage. Get rid of throw rugs, if possible.  Have an electrician put in a light switch at the top and bottom of the stairs. OTHER FALL PREVENTION TIPS  Wear low-heel or rubber-soled shoes that are supportive and fit well. Wear closed toe shoes.  When using a stepladder, make sure it is fully opened and both spreaders are firmly locked. Do not climb a closed stepladder.  Add color or contrast paint or tape to grab bars and handrails in your home. Place contrasting color strips on first and last steps.  Learn and use mobility aids as needed. Install an electrical emergency response system.  Turn on lights to avoid dark areas. Replace light bulbs that burn out immediately. Get light switches that glow.  Arrange furniture to create clear pathways. Keep furniture in the same place.  Firmly attach carpet with non-skid or double-sided tape.  Eliminate uneven floor surfaces.  Select a carpet pattern that does not visually hide the edge of steps.  Be aware of all pets. OTHER HOME SAFETY TIPS  Set the water temperature for 120 F (48.8 C).  Keep emergency numbers on or near the telephone.  Keep smoke detectors on every level of the home and near sleeping areas. Document Released: 06/10/2002 Document Revised: 12/20/2011 Document Reviewed: 09/09/2011 [facility] Patient Information 2014 [place].

## 2013-12-10 DIAGNOSIS — J309 Allergic rhinitis, unspecified: Secondary | ICD-10-CM | POA: Diagnosis not present

## 2013-12-18 DIAGNOSIS — J309 Allergic rhinitis, unspecified: Secondary | ICD-10-CM | POA: Diagnosis not present

## 2013-12-24 ENCOUNTER — Other Ambulatory Visit: Payer: Self-pay | Admitting: Internal Medicine

## 2013-12-25 DIAGNOSIS — C44721 Squamous cell carcinoma of skin of unspecified lower limb, including hip: Secondary | ICD-10-CM | POA: Diagnosis not present

## 2013-12-25 DIAGNOSIS — J309 Allergic rhinitis, unspecified: Secondary | ICD-10-CM | POA: Diagnosis not present

## 2013-12-25 DIAGNOSIS — L905 Scar conditions and fibrosis of skin: Secondary | ICD-10-CM | POA: Diagnosis not present

## 2014-01-01 DIAGNOSIS — J309 Allergic rhinitis, unspecified: Secondary | ICD-10-CM | POA: Diagnosis not present

## 2014-01-08 DIAGNOSIS — J309 Allergic rhinitis, unspecified: Secondary | ICD-10-CM | POA: Diagnosis not present

## 2014-01-17 DIAGNOSIS — J309 Allergic rhinitis, unspecified: Secondary | ICD-10-CM | POA: Diagnosis not present

## 2014-01-20 ENCOUNTER — Other Ambulatory Visit: Payer: Self-pay | Admitting: Internal Medicine

## 2014-01-24 DIAGNOSIS — J309 Allergic rhinitis, unspecified: Secondary | ICD-10-CM | POA: Diagnosis not present

## 2014-01-29 DIAGNOSIS — J309 Allergic rhinitis, unspecified: Secondary | ICD-10-CM | POA: Diagnosis not present

## 2014-02-01 ENCOUNTER — Other Ambulatory Visit: Payer: Self-pay | Admitting: Internal Medicine

## 2014-02-03 DIAGNOSIS — J309 Allergic rhinitis, unspecified: Secondary | ICD-10-CM | POA: Diagnosis not present

## 2014-02-05 ENCOUNTER — Other Ambulatory Visit: Payer: Self-pay | Admitting: Internal Medicine

## 2014-02-10 ENCOUNTER — Ambulatory Visit (INDEPENDENT_AMBULATORY_CARE_PROVIDER_SITE_OTHER): Payer: Medicare Other

## 2014-02-10 ENCOUNTER — Ambulatory Visit (INDEPENDENT_AMBULATORY_CARE_PROVIDER_SITE_OTHER): Payer: Medicare Other | Admitting: Internal Medicine

## 2014-02-10 VITALS — BP 179/82 | HR 59 | Temp 98.0°F | Resp 18 | Ht 61.0 in | Wt 149.0 lb

## 2014-02-10 DIAGNOSIS — R350 Frequency of micturition: Secondary | ICD-10-CM | POA: Diagnosis not present

## 2014-02-10 DIAGNOSIS — R52 Pain, unspecified: Secondary | ICD-10-CM | POA: Diagnosis not present

## 2014-02-10 LAB — POCT UA - MICROSCOPIC ONLY
Casts, Ur, LPF, POC: NEGATIVE
Crystals, Ur, HPF, POC: NEGATIVE
MUCUS UA: NEGATIVE
RBC, URINE, MICROSCOPIC: NEGATIVE
WBC, UR, HPF, POC: NEGATIVE
Yeast, UA: NEGATIVE

## 2014-02-10 LAB — POCT URINALYSIS DIPSTICK
BILIRUBIN UA: NEGATIVE
GLUCOSE UA: NEGATIVE
KETONES UA: NEGATIVE
Leukocytes, UA: NEGATIVE
Nitrite, UA: NEGATIVE
Protein, UA: NEGATIVE
RBC UA: NEGATIVE
SPEC GRAV UA: 1.01
Urobilinogen, UA: 0.2
pH, UA: 6.5

## 2014-02-10 NOTE — Patient Instructions (Signed)

## 2014-02-10 NOTE — Progress Notes (Signed)
   Subjective:    Patient ID: Robin Ferguson, female    DOB: Feb 24, 1923, 80 y.o.   MRN: [id number]  HPI   80 y/o female complains of back pain  Started a while ago undetermined  Appt. With chiropractor.    Wants xray   Arthritis to show chiropractor .  Walks ok.  Urinary Frequency often many times nightly.   Uses a walker but walks ok. No distal radiation, weakness, numbness, or incontinence.   Review of Systems     Objective:   Physical Exam  Constitutional: She is oriented to person, place, and time. She appears well-developed and well-nourished. No distress.  HENT:  Head: Normocephalic.  Eyes: EOM are normal.  Neck: Normal range of motion. Neck supple. No thyromegaly present.  Cardiovascular: Normal rate.   Pulmonary/Chest: Effort normal.  Abdominal: There is no tenderness.  Lymphadenopathy:    She has no cervical adenopathy.  Neurological: She is alert and oriented to person, place, and time. No cranial nerve deficit. She exhibits normal muscle tone. Coordination normal.  Psychiatric: She has a normal mood and affect. Her behavior is normal.     [facility] reading (PRIMARY) by  Dr.Guest severe DDD and spondylosis L2-S1  Results for orders placed in visit on 02/10/14  POCT UA - MICROSCOPIC ONLY      Result Value Ref Range   WBC, Ur, HPF, POC neg     RBC, urine, microscopic neg     Bacteria, U Microscopic trace     Mucus, UA neg     Epithelial cells, urine per micros 0-1     Crystals, Ur, HPF, POC neg     Casts, Ur, LPF, POC neg     Yeast, UA neg    POCT URINALYSIS DIPSTICK      Result Value Ref Range   Color, UA lt yellow     Clarity, UA clear     Glucose, UA neg     Bilirubin, UA neg     Ketones, UA neg     Spec Grav, UA 1.010     Blood, UA neg     pH, UA 6.5     Protein, UA neg     Urobilinogen, UA 0.2     Nitrite, UA neg     Leukocytes, UA Negative          Assessment & Plan:  LBP with DDD and DJD  CD of xr/See your specialist

## 2014-02-12 ENCOUNTER — Telehealth: Payer: Self-pay | Admitting: Internal Medicine

## 2014-02-12 ENCOUNTER — Encounter: Payer: Self-pay | Admitting: Family Medicine

## 2014-02-12 ENCOUNTER — Ambulatory Visit (INDEPENDENT_AMBULATORY_CARE_PROVIDER_SITE_OTHER): Payer: Medicare Other | Admitting: Family Medicine

## 2014-02-12 VITALS — BP 132/69 | HR 58 | Temp 99.3°F | Ht 61.0 in | Wt 148.0 lb

## 2014-02-12 DIAGNOSIS — N390 Urinary tract infection, site not specified: Secondary | ICD-10-CM

## 2014-02-12 LAB — POCT URINALYSIS DIPSTICK
BILIRUBIN UA: NEGATIVE
GLUCOSE UA: NEGATIVE
Ketones, UA: NEGATIVE
NITRITE UA: NEGATIVE
RBC UA: NEGATIVE
Spec Grav, UA: 1.02
UROBILINOGEN UA: 0.2
pH, UA: 5

## 2014-02-12 MED ORDER — CIPROFLOXACIN HCL 500 MG PO TABS: 500.0000 mg | ORAL_TABLET | Freq: Two times a day (BID) | ORAL | Status: DC

## 2014-02-12 NOTE — Addendum Note (Signed)
Addended by: Aggie Hacker A on: 02/12/2014 04:47 PM   Modules accepted: Orders

## 2014-02-12 NOTE — Progress Notes (Signed)
Pre visit review using our clinic review tool, if applicable. No additional management support is needed unless otherwise documented below in the visit note. 

## 2014-02-12 NOTE — Progress Notes (Signed)
   Subjective:    Patient ID: Robin Ferguson, female    DOB: 1923-02-10, 80 y.o.   MRN: [id number]  HPI Here with her daughter for 4 days of increased urgency and frequency of urinations, along with some increased confusion, weakness, and low grade fevers.    Review of Systems  Constitutional: Positive for fever and fatigue. Negative for chills and diaphoresis.  Respiratory: Negative.   Cardiovascular: Negative.   Gastrointestinal: Negative.   Genitourinary: Positive for urgency and frequency. Negative for dysuria, hematuria, flank pain and difficulty urinating.       Objective:   Physical Exam  Constitutional: She appears well-developed and well-nourished.  Cardiovascular: Normal rate, regular rhythm, normal heart sounds and intact distal pulses.   Pulmonary/Chest: Effort normal and breath sounds normal.  Abdominal: Soft. Bowel sounds are normal. She exhibits no distension. There is no tenderness. There is no rebound and no guarding.  Neurological: She is alert.          Assessment & Plan:  Treat with Cipro, drink plenty of water. Culture the urine.

## 2014-02-12 NOTE — Telephone Encounter (Signed)
Patient Information:  Caller Name: [name]  Phone: [phone]  Patient: Robin Ferguson  Gender: Female  DOB: 1923-04-21  Age: 80 Years  PCP: Phoebe Sharps (Adults only, leaving end of July 2015)  Office Follow Up:  Does the office need to follow up with this patient?: No  Instructions For The Office: N/A   Symptoms  Reason For Call & Symptoms: Reports patient feels weaker than normal today. Denies vomiting/diarrhea. Reports she is sweating and "feels very bad." Patient sometimes cannot describe things very well due to dementia.  Reviewed Health History In EMR: Yes  Reviewed Medications In EMR: Yes  Reviewed Allergies In EMR: Yes  Reviewed Surgeries / Procedures: Yes  Date of Onset of Symptoms: 02/12/2014  Guideline(s) Used:  Weakness (Generalized) and Fatigue  Disposition Per Guideline:   Go to Office Now  Reason For Disposition Reached:   Moderate weakness (i.e., interferes with work, school, normal activities) and cause unknown  Advice Given:  N/A  Patient Will Follow Care Advice:  YES  Appointment Scheduled:  02/12/2014 15:45:00 Appointment Scheduled Provider:  Alysia Penna [facility])

## 2014-02-12 NOTE — Telephone Encounter (Signed)
We will see her today

## 2014-02-14 LAB — URINE CULTURE
Colony Count: NO GROWTH
ORGANISM ID, BACTERIA: NO GROWTH

## 2014-02-20 ENCOUNTER — Other Ambulatory Visit: Payer: Self-pay | Admitting: Family

## 2014-02-21 DIAGNOSIS — J309 Allergic rhinitis, unspecified: Secondary | ICD-10-CM | POA: Diagnosis not present

## 2014-02-24 DIAGNOSIS — J309 Allergic rhinitis, unspecified: Secondary | ICD-10-CM | POA: Diagnosis not present

## 2014-03-18 DIAGNOSIS — H353190 Nonexudative age-related macular degeneration, unspecified eye, stage unspecified: Secondary | ICD-10-CM | POA: Diagnosis not present

## 2014-03-18 DIAGNOSIS — Z961 Presence of intraocular lens: Secondary | ICD-10-CM | POA: Diagnosis not present

## 2014-03-18 DIAGNOSIS — H5210 Myopia, unspecified eye: Secondary | ICD-10-CM | POA: Diagnosis not present

## 2014-03-18 DIAGNOSIS — J309 Allergic rhinitis, unspecified: Secondary | ICD-10-CM | POA: Diagnosis not present

## 2014-04-07 DIAGNOSIS — M47812 Spondylosis without myelopathy or radiculopathy, cervical region: Secondary | ICD-10-CM | POA: Diagnosis not present

## 2014-04-07 DIAGNOSIS — M4806 Spinal stenosis, lumbar region: Secondary | ICD-10-CM | POA: Diagnosis not present

## 2014-04-07 DIAGNOSIS — M47817 Spondylosis without myelopathy or radiculopathy, lumbosacral region: Secondary | ICD-10-CM | POA: Diagnosis not present

## 2014-04-08 ENCOUNTER — Other Ambulatory Visit: Payer: Self-pay | Admitting: *Deleted

## 2014-04-08 MED ORDER — LISINOPRIL 20 MG PO TABS: ORAL_TABLET | ORAL | Status: DC

## 2014-04-08 MED ORDER — DONEPEZIL HCL 10 MG PO TABS: ORAL_TABLET | ORAL | Status: DC

## 2014-04-08 MED ORDER — ROSUVASTATIN CALCIUM 20 MG PO TABS: 30.0000 mg | ORAL_TABLET | ORAL | Status: DC

## 2014-04-08 MED ORDER — RABEPRAZOLE SODIUM 20 MG PO TBEC: DELAYED_RELEASE_TABLET | ORAL | Status: DC

## 2014-04-08 MED ORDER — LEVOTHYROXINE SODIUM 100 MCG PO TABS: ORAL_TABLET | ORAL | Status: DC

## 2014-04-08 MED ORDER — FUROSEMIDE 20 MG PO TABS: ORAL_TABLET | ORAL | Status: DC

## 2014-04-08 MED ORDER — ESCITALOPRAM OXALATE 5 MG PO TABS: ORAL_TABLET | ORAL | Status: DC

## 2014-04-08 MED ORDER — METOPROLOL TARTRATE 50 MG PO TABS: ORAL_TABLET | ORAL | Status: DC

## 2014-04-21 DIAGNOSIS — M47817 Spondylosis without myelopathy or radiculopathy, lumbosacral region: Secondary | ICD-10-CM | POA: Diagnosis not present

## 2014-04-21 DIAGNOSIS — M4806 Spinal stenosis, lumbar region: Secondary | ICD-10-CM | POA: Diagnosis not present

## 2014-04-21 DIAGNOSIS — M47812 Spondylosis without myelopathy or radiculopathy, cervical region: Secondary | ICD-10-CM | POA: Diagnosis not present

## 2014-04-27 DIAGNOSIS — M47816 Spondylosis without myelopathy or radiculopathy, lumbar region: Secondary | ICD-10-CM | POA: Diagnosis not present

## 2014-05-05 ENCOUNTER — Ambulatory Visit (INDEPENDENT_AMBULATORY_CARE_PROVIDER_SITE_OTHER): Payer: Medicare Other

## 2014-05-05 ENCOUNTER — Encounter: Payer: Self-pay | Admitting: Family Medicine

## 2014-05-05 ENCOUNTER — Ambulatory Visit: Payer: Medicare Other | Admitting: *Deleted

## 2014-05-05 DIAGNOSIS — Z23 Encounter for immunization: Secondary | ICD-10-CM | POA: Diagnosis not present

## 2014-05-08 DIAGNOSIS — M4806 Spinal stenosis, lumbar region: Secondary | ICD-10-CM | POA: Diagnosis not present

## 2014-05-21 ENCOUNTER — Ambulatory Visit (INDEPENDENT_AMBULATORY_CARE_PROVIDER_SITE_OTHER): Payer: Medicare Other | Admitting: Family Medicine

## 2014-05-21 ENCOUNTER — Encounter: Payer: Self-pay | Admitting: Family Medicine

## 2014-05-21 VITALS — BP 140/88 | HR 65 | Temp 98.7°F | Ht 61.0 in | Wt 145.0 lb

## 2014-05-21 DIAGNOSIS — N390 Urinary tract infection, site not specified: Secondary | ICD-10-CM

## 2014-05-21 LAB — POCT URINALYSIS DIPSTICK
Bilirubin, UA: NEGATIVE
Glucose, UA: NEGATIVE
Ketones, UA: NEGATIVE
NITRITE UA: NEGATIVE
Spec Grav, UA: 1.01
UROBILINOGEN UA: 0.2
pH, UA: 7.5

## 2014-05-21 MED ORDER — CIPROFLOXACIN HCL 500 MG PO TABS: 500.0000 mg | ORAL_TABLET | Freq: Two times a day (BID) | ORAL | Status: DC

## 2014-05-21 NOTE — Addendum Note (Signed)
Addended by: Aggie Hacker A on: 05/21/2014 05:35 PM   Modules accepted: Orders

## 2014-05-21 NOTE — Progress Notes (Signed)
   Subjective:    Patient ID: Robin Ferguson, female    DOB: Oct 08, 1922, 80 y.o.   MRN: [id number]  HPI Here for 2 days of lower abdominal pressure and some foul smelling urine. No burning or fever or nausea. She has been treated twice this year for presumed UTIs (once with Macrobid and once with Cipro) although each time her culture revealed no growth. Each time she responded well to the antibiotic.    Review of Systems  Constitutional: Negative.   HENT: Negative.   Eyes: Negative.   Respiratory: Negative.   Cardiovascular: Negative.   Gastrointestinal: Negative.   Genitourinary: Negative.        Objective:   Physical Exam  Constitutional: She appears well-developed and well-nourished.  Pulmonary/Chest: Effort normal and breath sounds normal.  Abdominal: Soft. Bowel sounds are normal. She exhibits no distension and no mass. There is no tenderness. There is no rebound and no guarding.          Assessment & Plan:  treta with Cipro and culture the sample

## 2014-05-21 NOTE — Progress Notes (Signed)
Pre visit review using our clinic review tool, if applicable. No additional management support is needed unless otherwise documented below in the visit note. 

## 2014-05-22 LAB — URINE CULTURE
Colony Count: NO GROWTH
Organism ID, Bacteria: NO GROWTH

## 2014-06-02 DIAGNOSIS — M47816 Spondylosis without myelopathy or radiculopathy, lumbar region: Secondary | ICD-10-CM | POA: Diagnosis not present

## 2014-06-02 DIAGNOSIS — M4806 Spinal stenosis, lumbar region: Secondary | ICD-10-CM | POA: Diagnosis not present

## 2014-07-07 ENCOUNTER — Encounter: Payer: Self-pay | Admitting: Internal Medicine

## 2014-07-08 DIAGNOSIS — J3089 Other allergic rhinitis: Secondary | ICD-10-CM | POA: Diagnosis not present

## 2014-07-08 DIAGNOSIS — J301 Allergic rhinitis due to pollen: Secondary | ICD-10-CM | POA: Diagnosis not present

## 2014-09-01 DIAGNOSIS — J301 Allergic rhinitis due to pollen: Secondary | ICD-10-CM | POA: Diagnosis not present

## 2014-09-01 DIAGNOSIS — J3089 Other allergic rhinitis: Secondary | ICD-10-CM | POA: Diagnosis not present

## 2014-09-08 ENCOUNTER — Encounter: Payer: Self-pay | Admitting: Family Medicine

## 2014-09-08 ENCOUNTER — Ambulatory Visit (INDEPENDENT_AMBULATORY_CARE_PROVIDER_SITE_OTHER): Payer: Medicare Other | Admitting: Family Medicine

## 2014-09-08 VITALS — BP 112/70 | HR 61 | Temp 98.6°F | Wt 157.0 lb

## 2014-09-08 DIAGNOSIS — F0391 Unspecified dementia with behavioral disturbance: Secondary | ICD-10-CM | POA: Diagnosis not present

## 2014-09-08 DIAGNOSIS — Z23 Encounter for immunization: Secondary | ICD-10-CM

## 2014-09-08 NOTE — Assessment & Plan Note (Addendum)
Family does not desire further reversible cause workup. Daughter took patient off aricept and with MMSE of 8/30 today I am not sure of benefit of continued use of medication. I agreed to continue patient off medication. Other reason for visit was to discuss home health needs so I will await [facility] information for home health requests. My thought would be need for RN/home health aid/social work.

## 2014-09-08 NOTE — Progress Notes (Signed)
Garret Reddish, MD Phone: [phone]  Subjective:   Robin Ferguson is a 81 y.o. year old very pleasant female patient who presents with the following:  Dementia Daughter denies reversible cause workup. Had MRI in 2014 for other reasons which showed global atrophy. Issues started around 2013, gradual decline. Had been on aricept but daughter took her off a week ago without any clear difference. Has had TIA in past but never CVA and not thought to be vascular  Lives in [facility] for independent living. Lives with a boyfriend who helps her. Independent ADLs but no IADLs.   [facility] home [facility] to do evaluation. Family wants to wait to have [place] fax request here. No clear PT/OT needs. May have needs from RN, home health aide, social works.   MMSE 8/30 today  ROS- denies headaches, blurry vision, fatigue  Past Medical History- dementia, history TIA, hypothyroidsim, carotid stenosis, HLD, HTN, asthma  Medications- reviewed and updated Current Outpatient Prescriptions  Medication Sig Dispense Refill  . aspirin 81 MG tablet Take 81 mg by mouth daily.    . calcium carbonate (TUMS) 500 MG chewable tablet Chew 1 tablet (200 mg of elemental calcium total) by mouth 3 (three) times daily. 30 tablet 0  . escitalopram (LEXAPRO) 5 MG tablet TAKE ONE TABLET BY MOUTH ONCE DAILY. 90 tablet 1  . furosemide (LASIX) 20 MG tablet TAKE ONE TABLET BY MOUTH ONCE DAILY. 90 tablet 1  . glucosamine-chondroitin 500-400 MG tablet Take 1 tablet by mouth daily.     Marland Kitchen levothyroxine (SYNTHROID, LEVOTHROID) 100 MCG tablet TAKE ONE TABLET BY MOUTH ONCE DAILY 90 tablet 1  . lisinopril (PRINIVIL,ZESTRIL) 20 MG tablet TAKE ONE TABLET BY MOUTH ONCE DAILY 90 tablet 1  . loratadine (ALLERGY RELIEF) 10 MG tablet Take 10 mg by mouth daily.      . metoprolol (LOPRESSOR) 50 MG tablet TAKE ONE-HALF TABLET BY MOUTH TWICE DAILY 90 tablet 1  . mupirocin ointment (BACTROBAN) 2 % Apply 1 application topically 3 (three) times  daily. 22 g 0  . RABEprazole (ACIPHEX) 20 MG tablet TAKE ONE TABLET BY MOUTH ONCE DAILY 90 tablet 1  . rosuvastatin (CRESTOR) 20 MG tablet Take 1.5 tablets (30 mg total) by mouth every other day. 135 tablet 1  . donepezil (ARICEPT) 10 MG tablet TAKE ONE TABLET BY MOUTH AT BEDTIME (Patient not taking: Reported on 09/08/2014) 90 tablet 1  . fluticasone (FLONASE) 50 MCG/ACT nasal spray Place 2 sprays into the nose daily. (Patient not taking: Reported on 09/08/2014) 16 g 3  . meclizine (ANTIVERT) 25 MG tablet Take 1 tablet (25 mg total) by mouth 3 (three) times daily as needed for dizziness. (Patient not taking: Reported on 09/08/2014) 30 tablet 0   Objective: BP 112/70 mmHg  Pulse 61  Temp(Src) 98.6 F (37 C)  Wt 157 lb (71.215 kg)  SpO2 95% Gen: NAD, resting comfortably in chair, pleasantly demented NCAT CV: RRR no murmurs rubs or gallops Lungs: CTAB no crackles, wheeze, rhonchi Abdomen: soft/nontender/nondistended/normal bowel sounds.  Ext: no edema Skin: warm, dry Neuro: MMSE 8/30   Assessment/Plan:  Dementia Family does not desire further reversible cause workup. Daughter took patient off aricept and with MMSE of 8/30 today I am not sure of benefit of continued use of medication. I agreed to continue patient off medication. Other reason for visit was to discuss home health needs so I will await [facility] information for home health requests. My thought would be need for RN/home health aid/social work.  Return precautions advised. Follow up at establish visit.   Orders Placed This Encounter  Procedures  . Pneumococcal conjugate vaccine 13-valent

## 2014-09-08 NOTE — Patient Instructions (Signed)
I think the aricept is having little benefit at her level of dementia. I agree with your decision to take her off of medicine. Decline may accelerate potentially but on the other hand fewer medications the better for her.   I will see you back soon to review rest of her problems.   Really need to get [facility] orders soon so we can fill out their request.

## 2014-09-11 DIAGNOSIS — Z7982 Long term (current) use of aspirin: Secondary | ICD-10-CM | POA: Diagnosis not present

## 2014-09-11 DIAGNOSIS — I447 Left bundle-branch block, unspecified: Secondary | ICD-10-CM | POA: Diagnosis not present

## 2014-09-11 DIAGNOSIS — I10 Essential (primary) hypertension: Secondary | ICD-10-CM | POA: Diagnosis not present

## 2014-09-11 DIAGNOSIS — Z8673 Personal history of transient ischemic attack (TIA), and cerebral infarction without residual deficits: Secondary | ICD-10-CM | POA: Diagnosis not present

## 2014-09-11 DIAGNOSIS — J45909 Unspecified asthma, uncomplicated: Secondary | ICD-10-CM | POA: Diagnosis not present

## 2014-09-11 DIAGNOSIS — F0391 Unspecified dementia with behavioral disturbance: Secondary | ICD-10-CM | POA: Diagnosis not present

## 2014-09-12 ENCOUNTER — Telehealth: Payer: Self-pay | Admitting: Internal Medicine

## 2014-09-12 NOTE — Telephone Encounter (Signed)
Is this ok?

## 2014-09-12 NOTE — Telephone Encounter (Signed)
Nurse with [facility] found her ST memory was poor with 1 item recall after one min (base of three items). RN suggests speech therapy  and social work referral. Patients manages her bath quite well as [employer]. Suggested visit frequency is 1 wk 1, 2 wk 2 and 1 wk 1 based on medicare guidelines. Please call with VO.

## 2014-09-12 NOTE — Telephone Encounter (Signed)
Returned call to [employer] providing verbal order

## 2014-09-12 NOTE — Telephone Encounter (Signed)
Yes we recently had face to face.

## 2014-09-16 DIAGNOSIS — J45909 Unspecified asthma, uncomplicated: Secondary | ICD-10-CM | POA: Diagnosis not present

## 2014-09-16 DIAGNOSIS — I447 Left bundle-branch block, unspecified: Secondary | ICD-10-CM | POA: Diagnosis not present

## 2014-09-16 DIAGNOSIS — I10 Essential (primary) hypertension: Secondary | ICD-10-CM | POA: Diagnosis not present

## 2014-09-16 DIAGNOSIS — F0391 Unspecified dementia with behavioral disturbance: Secondary | ICD-10-CM | POA: Diagnosis not present

## 2014-09-16 DIAGNOSIS — Z8673 Personal history of transient ischemic attack (TIA), and cerebral infarction without residual deficits: Secondary | ICD-10-CM | POA: Diagnosis not present

## 2014-09-16 DIAGNOSIS — Z7982 Long term (current) use of aspirin: Secondary | ICD-10-CM | POA: Diagnosis not present

## 2014-09-18 DIAGNOSIS — Z7982 Long term (current) use of aspirin: Secondary | ICD-10-CM | POA: Diagnosis not present

## 2014-09-18 DIAGNOSIS — Z8673 Personal history of transient ischemic attack (TIA), and cerebral infarction without residual deficits: Secondary | ICD-10-CM | POA: Diagnosis not present

## 2014-09-18 DIAGNOSIS — J45909 Unspecified asthma, uncomplicated: Secondary | ICD-10-CM | POA: Diagnosis not present

## 2014-09-18 DIAGNOSIS — F0391 Unspecified dementia with behavioral disturbance: Secondary | ICD-10-CM | POA: Diagnosis not present

## 2014-09-18 DIAGNOSIS — I10 Essential (primary) hypertension: Secondary | ICD-10-CM | POA: Diagnosis not present

## 2014-09-18 DIAGNOSIS — I447 Left bundle-branch block, unspecified: Secondary | ICD-10-CM | POA: Diagnosis not present

## 2014-09-19 DIAGNOSIS — Z8673 Personal history of transient ischemic attack (TIA), and cerebral infarction without residual deficits: Secondary | ICD-10-CM | POA: Diagnosis not present

## 2014-09-19 DIAGNOSIS — I10 Essential (primary) hypertension: Secondary | ICD-10-CM | POA: Diagnosis not present

## 2014-09-19 DIAGNOSIS — Z7982 Long term (current) use of aspirin: Secondary | ICD-10-CM | POA: Diagnosis not present

## 2014-09-19 DIAGNOSIS — F0391 Unspecified dementia with behavioral disturbance: Secondary | ICD-10-CM | POA: Diagnosis not present

## 2014-09-19 DIAGNOSIS — I447 Left bundle-branch block, unspecified: Secondary | ICD-10-CM | POA: Diagnosis not present

## 2014-09-19 DIAGNOSIS — J45909 Unspecified asthma, uncomplicated: Secondary | ICD-10-CM | POA: Diagnosis not present

## 2014-09-23 DIAGNOSIS — I10 Essential (primary) hypertension: Secondary | ICD-10-CM | POA: Diagnosis not present

## 2014-09-23 DIAGNOSIS — I447 Left bundle-branch block, unspecified: Secondary | ICD-10-CM | POA: Diagnosis not present

## 2014-09-23 DIAGNOSIS — F0391 Unspecified dementia with behavioral disturbance: Secondary | ICD-10-CM | POA: Diagnosis not present

## 2014-09-23 DIAGNOSIS — J45909 Unspecified asthma, uncomplicated: Secondary | ICD-10-CM | POA: Diagnosis not present

## 2014-09-23 DIAGNOSIS — Z8673 Personal history of transient ischemic attack (TIA), and cerebral infarction without residual deficits: Secondary | ICD-10-CM | POA: Diagnosis not present

## 2014-09-23 DIAGNOSIS — Z7982 Long term (current) use of aspirin: Secondary | ICD-10-CM | POA: Diagnosis not present

## 2014-09-25 ENCOUNTER — Other Ambulatory Visit: Payer: Self-pay

## 2014-09-25 DIAGNOSIS — I10 Essential (primary) hypertension: Secondary | ICD-10-CM | POA: Diagnosis not present

## 2014-09-25 DIAGNOSIS — I447 Left bundle-branch block, unspecified: Secondary | ICD-10-CM | POA: Diagnosis not present

## 2014-09-25 DIAGNOSIS — J45909 Unspecified asthma, uncomplicated: Secondary | ICD-10-CM | POA: Diagnosis not present

## 2014-09-25 DIAGNOSIS — Z8673 Personal history of transient ischemic attack (TIA), and cerebral infarction without residual deficits: Secondary | ICD-10-CM | POA: Diagnosis not present

## 2014-09-25 DIAGNOSIS — Z7982 Long term (current) use of aspirin: Secondary | ICD-10-CM | POA: Diagnosis not present

## 2014-09-25 DIAGNOSIS — F0391 Unspecified dementia with behavioral disturbance: Secondary | ICD-10-CM | POA: Diagnosis not present

## 2014-09-25 MED ORDER — RABEPRAZOLE SODIUM 20 MG PO TBEC: DELAYED_RELEASE_TABLET | ORAL | Status: DC

## 2014-09-25 MED ORDER — ROSUVASTATIN CALCIUM 20 MG PO TABS: 30.0000 mg | ORAL_TABLET | ORAL | Status: DC

## 2014-09-25 MED ORDER — LEVOTHYROXINE SODIUM 100 MCG PO TABS: ORAL_TABLET | ORAL | Status: DC

## 2014-09-25 MED ORDER — ESCITALOPRAM OXALATE 5 MG PO TABS: ORAL_TABLET | ORAL | Status: DC

## 2014-09-25 MED ORDER — LISINOPRIL 20 MG PO TABS: ORAL_TABLET | ORAL | Status: DC

## 2014-09-25 MED ORDER — METOPROLOL TARTRATE 50 MG PO TABS: ORAL_TABLET | ORAL | Status: DC

## 2014-09-25 MED ORDER — FUROSEMIDE 20 MG PO TABS: ORAL_TABLET | ORAL | Status: DC

## 2014-10-02 DIAGNOSIS — M47812 Spondylosis without myelopathy or radiculopathy, cervical region: Secondary | ICD-10-CM | POA: Diagnosis not present

## 2014-10-02 DIAGNOSIS — F0391 Unspecified dementia with behavioral disturbance: Secondary | ICD-10-CM | POA: Diagnosis not present

## 2014-10-02 DIAGNOSIS — Z7982 Long term (current) use of aspirin: Secondary | ICD-10-CM | POA: Diagnosis not present

## 2014-10-02 DIAGNOSIS — J45909 Unspecified asthma, uncomplicated: Secondary | ICD-10-CM | POA: Diagnosis not present

## 2014-10-02 DIAGNOSIS — Z8673 Personal history of transient ischemic attack (TIA), and cerebral infarction without residual deficits: Secondary | ICD-10-CM | POA: Diagnosis not present

## 2014-10-02 DIAGNOSIS — I10 Essential (primary) hypertension: Secondary | ICD-10-CM | POA: Diagnosis not present

## 2014-10-02 DIAGNOSIS — I447 Left bundle-branch block, unspecified: Secondary | ICD-10-CM | POA: Diagnosis not present

## 2014-10-20 ENCOUNTER — Other Ambulatory Visit: Payer: Self-pay

## 2014-10-20 MED ORDER — ESCITALOPRAM OXALATE 5 MG PO TABS: ORAL_TABLET | ORAL | Status: DC

## 2014-10-20 MED ORDER — FUROSEMIDE 20 MG PO TABS: ORAL_TABLET | ORAL | Status: DC

## 2014-10-28 ENCOUNTER — Telehealth: Payer: Self-pay | Admitting: Internal Medicine

## 2014-10-28 MED ORDER — FUROSEMIDE 20 MG PO TABS: ORAL_TABLET | ORAL | Status: DC

## 2014-10-28 MED ORDER — ESCITALOPRAM OXALATE 5 MG PO TABS: ORAL_TABLET | ORAL | Status: DC

## 2014-10-28 NOTE — Telephone Encounter (Signed)
Pt states [facility] is temporarily out of thes meds. escitalopram (LEXAPRO) 5 MG tablet furosemide (LASIX) 20 MG tablet  Pt would like to know if you can send 30 day to  [facility]  Pt has been instructed to check back with [name] in a few weeks to see if their meds are available

## 2014-10-28 NOTE — Telephone Encounter (Signed)
Medications sent for 30day supply to local pharmacy.

## 2014-10-29 ENCOUNTER — Ambulatory Visit (INDEPENDENT_AMBULATORY_CARE_PROVIDER_SITE_OTHER): Payer: Medicare Other | Admitting: Family Medicine

## 2014-10-29 ENCOUNTER — Encounter: Payer: Self-pay | Admitting: Family Medicine

## 2014-10-29 VITALS — BP 138/72 | HR 59 | Temp 98.0°F | Wt 157.0 lb

## 2014-10-29 DIAGNOSIS — G459 Transient cerebral ischemic attack, unspecified: Secondary | ICD-10-CM

## 2014-10-29 DIAGNOSIS — I447 Left bundle-branch block, unspecified: Secondary | ICD-10-CM

## 2014-10-29 DIAGNOSIS — K2100 Gastro-esophageal reflux disease with esophagitis, without bleeding: Secondary | ICD-10-CM

## 2014-10-29 DIAGNOSIS — I6523 Occlusion and stenosis of bilateral carotid arteries: Secondary | ICD-10-CM

## 2014-10-29 DIAGNOSIS — E039 Hypothyroidism, unspecified: Secondary | ICD-10-CM | POA: Diagnosis not present

## 2014-10-29 DIAGNOSIS — I10 Essential (primary) hypertension: Secondary | ICD-10-CM | POA: Diagnosis not present

## 2014-10-29 DIAGNOSIS — J302 Other seasonal allergic rhinitis: Secondary | ICD-10-CM | POA: Diagnosis not present

## 2014-10-29 DIAGNOSIS — E785 Hyperlipidemia, unspecified: Secondary | ICD-10-CM

## 2014-10-29 DIAGNOSIS — F32A Depression, unspecified: Secondary | ICD-10-CM | POA: Insufficient documentation

## 2014-10-29 DIAGNOSIS — J309 Allergic rhinitis, unspecified: Secondary | ICD-10-CM | POA: Insufficient documentation

## 2014-10-29 DIAGNOSIS — K219 Gastro-esophageal reflux disease without esophagitis: Secondary | ICD-10-CM | POA: Insufficient documentation

## 2014-10-29 DIAGNOSIS — Z66 Do not resuscitate: Secondary | ICD-10-CM

## 2014-10-29 DIAGNOSIS — F329 Major depressive disorder, single episode, unspecified: Secondary | ICD-10-CM

## 2014-10-29 DIAGNOSIS — Z85828 Personal history of other malignant neoplasm of skin: Secondary | ICD-10-CM

## 2014-10-29 LAB — TSH: TSH: 1.14 u[IU]/mL (ref 0.35–4.50)

## 2014-10-29 NOTE — Assessment & Plan Note (Signed)
lexapro 5mg . Attempted wean and became very tearful and emotional. Family desires to remain on and reasonable.

## 2014-10-29 NOTE — Assessment & Plan Note (Signed)
S: controlled on levothyroxine 175mcg Lab Results  Component Value Date   TSH 1.14 10/29/2014  A/P: discussed minimizing bloodwork but reasonable to check thyroid levels about once a year. Continue levothyroxine

## 2014-10-29 NOTE — Assessment & Plan Note (Signed)
S: controlled on Metoprolol 50mg  1/2 tab twice a day, lasix 20mg , lisinopril 20mg  A/P: discussed stopping lasix, continuing to reduce BP medicines with goal BP closer to 150/90 but ultimately considering even discontinuing BP meds

## 2014-10-29 NOTE — Assessment & Plan Note (Signed)
Stenosis up to 60-79% on left. Known CVA risk off asa, statin, less tight BP control but considering stopping for quality of life/lower pill burden/CVA may be more comfortable death than continued dementia

## 2014-10-29 NOTE — Assessment & Plan Note (Signed)
Long discussion with daughter today about moving toward more of a comfort approach at age 81 and with MMSE of 8/30. Decision had been discussed about family to become DNR/DNI. They have not yet discussed pulling off medication and we targeted lasix, aspirin, and crestor in our discussion today knowing that there would be a higher risk of MI or CVA but also acknowledging that would likely be a less painful end than eventually progressing to not feeding/eating.

## 2014-10-29 NOTE — Assessment & Plan Note (Signed)
Discussed coming off ASA despite TIA/CVA risk and known carotid stenosis

## 2014-10-29 NOTE — Progress Notes (Signed)
Robin Reddish, MD Phone: [phone]  Subjective:  Patient presents today to establish care with me as their new primary care provider. Patient was formerly a patient of Dr. Leanne Ferguson. Chief complaint-noted.   See problem oriented charting ROS-No hair or nail changes. No heat/cold intolerance. No constipation or diarrhea. Denies shakiness or anxiety. Does have some intermittent low back pain. No CP or SOB   The following were reviewed and entered/updated in epic: Past Medical History  Diagnosis Date  . Allergy     rhinitis  . Asthma   . Hypertension   . Hypothyroidism   . Hyperlipidemia   . Hx of adenomatous colonic polyps   . Diverticulosis   . Esophageal stricture   . Hemorrhoids   . PMB (postmenopausal bleeding)   . Tachycardia   . Cancer     skin- right shoulder-Squamous cell-legs  . Osteopenia   . DIVERTICULOSIS OF COLON 01/11/2008    Qualifier: Diagnosis of  By: [name], [name]   . Personal history of colonic polyps 01/11/2008    Qualifier: Diagnosis of  By: [name], [name]    Patient Active Problem List   Diagnosis Date Noted  . Do not resuscitate/ Do Not Intubate. Care Goals.  10/29/2014    Priority: High  . Dementia 04/28/2013    Priority: High  . Depression 10/29/2014    Priority: Medium  . Allergic rhinitis 10/29/2014    Priority: Medium  . Carotid stenosis 05/13/2013    Priority: Medium  . TIA (transient ischemic attack) 04/28/2013    Priority: Medium  . Hyperlipidemia 04/11/2007    Priority: Medium  . Hypothyroidism 01/16/2007    Priority: Medium  . Essential hypertension 01/16/2007    Priority: Medium  . GERD (gastroesophageal reflux disease) 10/29/2014    Priority: Low  . Osteopenia     Priority: Low  . Left bundle branch block 04/23/2007    Priority: Low  . SKIN CANCER, HX OF 01/16/2007    Priority: Low   Past Surgical History  Procedure Laterality Date  . Cataract extraction    . Nasal sinus surgery    . Shoulder surgery    .  Tubal ligation  1959  . Hysteroscopy    . Dilation and curettage of uterus      Family History  Problem Relation Age of Onset  . Cancer Mother     bone  . Heart disease Brother   . Diabetes Brother   . Heart disease Brother   . Diabetes Brother     Medications- reviewed and updated Current Outpatient Prescriptions  Medication Sig Dispense Refill  . aspirin 81 MG tablet Take 81 mg by mouth daily.    . calcium carbonate (TUMS) 500 MG chewable tablet Chew 1 tablet (200 mg of elemental calcium total) by mouth 3 (three) times daily. 30 tablet 0  . escitalopram (LEXAPRO) 5 MG tablet TAKE ONE TABLET BY MOUTH ONCE DAILY. 30 tablet 0  . furosemide (LASIX) 20 MG tablet TAKE ONE TABLET BY MOUTH ONCE DAILY. 30 tablet 0  . levothyroxine (SYNTHROID, LEVOTHROID) 100 MCG tablet TAKE ONE TABLET BY MOUTH ONCE DAILY 90 tablet 3  . lisinopril (PRINIVIL,ZESTRIL) 20 MG tablet TAKE ONE TABLET BY MOUTH ONCE DAILY 90 tablet 3  . loratadine (ALLERGY RELIEF) 10 MG tablet Take 10 mg by mouth daily.      . metoprolol (LOPRESSOR) 50 MG tablet TAKE ONE-HALF TABLET BY MOUTH TWICE DAILY 90 tablet 3  . RABEprazole (ACIPHEX) 20 MG tablet  TAKE ONE TABLET BY MOUTH ONCE DAILY 90 tablet 3  . rosuvastatin (CRESTOR) 20 MG tablet Take 1.5 tablets (30 mg total) by mouth every other day. 135 tablet 3  . fluticasone (FLONASE) 50 MCG/ACT nasal spray Place 2 sprays into the nose daily. (Patient not taking: Reported on 10/29/2014) 16 g 3   Allergies-reviewed and updated Allergies  Allergen Reactions  . Procaine Hcl     REACTION: heart palpitationbs    History   Social History  . Marital Status: Widowed    Spouse Name: N/A  . Number of Children: N/A  . Years of Education: N/A   Social History Main Topics  . Smoking status: Former Research scientist (life sciences)  . Smokeless tobacco: Never Used  . Alcohol Use: 0.0 oz/week    0 Standard drinks or equivalent per week     Comment: rare  . Drug Use: No  . Sexual Activity: No   Other Topics  Concern  . None   Social History Narrative   Lives at [facility] with a female partner (together 4 years), he is very helpful in continuing independent living. Husband passed 71. 4 kids. 5 grandkids. 2 greatgrandkids.       Retired [employer] 4 children.       Daughter Robin Ferguson and Robin Ferguson typically make decisions together though not clear if has HCPOA. DNR/DNI confirmed 10/29/14.     ROS--See HPI   Objective: BP 138/72 mmHg  Pulse 59  Temp(Src) 98 F (36.7 C)  Wt 157 lb (71.215 kg) Gen: NAD, resting comfortably, pleasantly confused HEENT: Mucous membranes are moist.  Neck: no thyromegaly CV: RRR no murmurs rubs or gallops Lungs: CTAB no crackles, wheeze, rhonchi Abdomen: soft/nontender/nondistended/normal bowel sounds. No rebound or guarding.  Ext: no edema Skin: warm, dry, several skin lesions concerning for AK (has derm appointment within a month and declines treatment) Neuro: grossly normal, moves all extremities, PERRLA   Assessment/Plan:  Do not resuscitate/ Do Not Intubate. Care Goals.  Long discussion with daughter today about moving toward more of a comfort approach at age 69 and with MMSE of 8/30. Decision had been discussed about family to become DNR/DNI. They have not yet discussed pulling off medication and we targeted lasix, aspirin, and crestor in our discussion today knowing that there would be a higher risk of MI or CVA but also acknowledging that would likely be a less painful end than eventually progressing to not feeding/eating.    Hypothyroidism S: controlled on levothyroxine 152mcg Lab Results  Component Value Date   TSH 1.14 10/29/2014  A/P: discussed minimizing bloodwork but reasonable to check thyroid levels about once a year. Continue levothyroxine    Hyperlipidemia S: presume controlled on crestor A/P: discussed stopping crestor, family discussion to be had before decision. Daughter likely to call me with decision.     Essential hypertension S: controlled on Metoprolol 50mg  1/2 tab twice a day, lasix 20mg , lisinopril 20mg  A/P: discussed stopping lasix, continuing to reduce BP medicines with goal BP closer to 150/90 but ultimately considering even discontinuing BP meds    TIA (transient ischemic attack) Discussed coming off ASA despite TIA/CVA risk and known carotid stenosis   Carotid stenosis Stenosis up to 60-79% on left. Known CVA risk off asa, statin, less tight BP control but considering stopping for quality of life/lower pill burden/CVA may be more comfortable death than continued dementia   Depression lexapro 5mg . Attempted wean and became very tearful and emotional. Family desires to remain on and reasonable.  1 year. Family to call about medication decisions.

## 2014-10-29 NOTE — Assessment & Plan Note (Signed)
S: presume controlled on crestor A/P: discussed stopping crestor, family discussion to be had before decision. Daughter likely to call me with decision.

## 2014-10-29 NOTE — Patient Instructions (Addendum)
Ask [place] allergy if possible to remove the loratadine due to dementia and new information that may worsen cognition. I am fine with you continuing it but possible could worsen cognition.   Overall, things look well. Let's make sure thyroid still in range.   We signed forms to move to do not resuscitate status  We also agreed to try to minimize medications except for ones that can provide comfort.   For this reason, we would consider coming off of aspirin, lasix, loratadine, crestor. Let's discuss this with family before moving forward with taking these off. Glad everyone has already agreed to the do not resuscitate status.   1 year

## 2014-11-27 DIAGNOSIS — L570 Actinic keratosis: Secondary | ICD-10-CM | POA: Diagnosis not present

## 2014-11-27 DIAGNOSIS — L821 Other seborrheic keratosis: Secondary | ICD-10-CM | POA: Diagnosis not present

## 2014-11-27 DIAGNOSIS — D485 Neoplasm of uncertain behavior of skin: Secondary | ICD-10-CM | POA: Diagnosis not present

## 2014-11-27 DIAGNOSIS — D2230 Melanocytic nevi of unspecified part of face: Secondary | ICD-10-CM | POA: Diagnosis not present

## 2014-11-27 DIAGNOSIS — Z85828 Personal history of other malignant neoplasm of skin: Secondary | ICD-10-CM | POA: Diagnosis not present

## 2014-12-29 DIAGNOSIS — J301 Allergic rhinitis due to pollen: Secondary | ICD-10-CM | POA: Diagnosis not present

## 2014-12-29 DIAGNOSIS — J3089 Other allergic rhinitis: Secondary | ICD-10-CM | POA: Diagnosis not present

## 2015-01-07 ENCOUNTER — Telehealth: Payer: Self-pay | Admitting: Family Medicine

## 2015-01-07 NOTE — Telephone Encounter (Signed)
She is already on lexapro 5mg . Robin Ferguson- if family would like you can increase this to 10mg  and follow up with me about 6 weeks from now if not improving.

## 2015-01-07 NOTE — Telephone Encounter (Signed)
Lm for [facility].

## 2015-01-07 NOTE — Telephone Encounter (Signed)
Daughter Robin Ferguson called to request if you might consider giving pt a mild "happy pill" maybe celexa, or whatever you might think. Pt is having unwarranted emotional outburst, sometimes extreme, start crying at a oments notice.  [facility] will not let her go on trips anymore because she will not do what she is asked and has had episodes lof sudden crying outburst if she does not get her way. [facility] pls advise

## 2015-01-07 NOTE — Telephone Encounter (Signed)
[name] returned call and notified.

## 2015-01-07 NOTE — Telephone Encounter (Signed)
See below

## 2015-01-20 ENCOUNTER — Other Ambulatory Visit: Payer: Self-pay

## 2015-01-20 MED ORDER — ESCITALOPRAM OXALATE 10 MG PO TABS: 10.0000 mg | ORAL_TABLET | Freq: Every day | ORAL | Status: DC

## 2015-02-11 ENCOUNTER — Encounter: Payer: Self-pay | Admitting: Gastroenterology

## 2015-03-02 ENCOUNTER — Telehealth: Payer: Self-pay | Admitting: Family Medicine

## 2015-03-02 DIAGNOSIS — M6281 Muscle weakness (generalized): Secondary | ICD-10-CM | POA: Diagnosis not present

## 2015-03-02 DIAGNOSIS — M545 Low back pain: Secondary | ICD-10-CM | POA: Diagnosis not present

## 2015-03-02 DIAGNOSIS — M25512 Pain in left shoulder: Secondary | ICD-10-CM | POA: Diagnosis not present

## 2015-03-02 DIAGNOSIS — M25511 Pain in right shoulder: Secondary | ICD-10-CM | POA: Diagnosis not present

## 2015-03-02 NOTE — Telephone Encounter (Signed)
See below

## 2015-03-02 NOTE — Telephone Encounter (Signed)
Daughter aware and looks forward to you all meeting again.  Appt scheduled 9/8 at 4:15. Thank you

## 2015-03-02 NOTE — Telephone Encounter (Signed)
Daughter called to fu on 7/06 concerning pt's behavior. At first, the increase in escitalopram (LEXAPRO) 10 MG tablet did well, and things seemed better. However now pt seems to have regressed.  Pt had another severe outburst/yelling confrontation in the lobby of the assisted living.  Which has got everyone at her living facility concerned again. But along w/ this, pt seems to be getting worse when it comes to conversing w/ others and finding the words to make sentences.  Daughter would like to know if you have any other suggestions on a med, but they would be happy to make an appt. Daughter also feels like it may be time for a memory care unit.  No appts except same day for several weeks. pls advise.

## 2015-03-02 NOTE — Telephone Encounter (Signed)
The next class of medicines are antipsychotics and they may increase mortality. I think the idea of moving towards a memory care unit may be the safer option. I am happy to discuss with them in person and you can use an SDA if needed but not sure if we would add or adjust meds at that time.

## 2015-03-03 DIAGNOSIS — M25511 Pain in right shoulder: Secondary | ICD-10-CM | POA: Diagnosis not present

## 2015-03-03 DIAGNOSIS — M25512 Pain in left shoulder: Secondary | ICD-10-CM | POA: Diagnosis not present

## 2015-03-03 DIAGNOSIS — M6281 Muscle weakness (generalized): Secondary | ICD-10-CM | POA: Diagnosis not present

## 2015-03-03 DIAGNOSIS — M545 Low back pain: Secondary | ICD-10-CM | POA: Diagnosis not present

## 2015-03-04 DIAGNOSIS — M25511 Pain in right shoulder: Secondary | ICD-10-CM | POA: Diagnosis not present

## 2015-03-04 DIAGNOSIS — M25512 Pain in left shoulder: Secondary | ICD-10-CM | POA: Diagnosis not present

## 2015-03-04 DIAGNOSIS — M545 Low back pain: Secondary | ICD-10-CM | POA: Diagnosis not present

## 2015-03-04 DIAGNOSIS — M6281 Muscle weakness (generalized): Secondary | ICD-10-CM | POA: Diagnosis not present

## 2015-03-05 DIAGNOSIS — M25511 Pain in right shoulder: Secondary | ICD-10-CM | POA: Diagnosis not present

## 2015-03-05 DIAGNOSIS — M25512 Pain in left shoulder: Secondary | ICD-10-CM | POA: Diagnosis not present

## 2015-03-05 DIAGNOSIS — M545 Low back pain: Secondary | ICD-10-CM | POA: Diagnosis not present

## 2015-03-05 DIAGNOSIS — M6281 Muscle weakness (generalized): Secondary | ICD-10-CM | POA: Diagnosis not present

## 2015-03-07 ENCOUNTER — Emergency Department (HOSPITAL_COMMUNITY)
Admission: EM | Admit: 2015-03-07 | Discharge: 2015-03-07 | Disposition: A | Discharge status: Home / Self Care | Payer: Medicare Other | Attending: Emergency Medicine | Admitting: Emergency Medicine

## 2015-03-07 ENCOUNTER — Encounter (HOSPITAL_COMMUNITY): Payer: Self-pay | Admitting: Emergency Medicine

## 2015-03-07 DIAGNOSIS — F0390 Unspecified dementia without behavioral disturbance: Secondary | ICD-10-CM | POA: Insufficient documentation

## 2015-03-07 DIAGNOSIS — I10 Essential (primary) hypertension: Secondary | ICD-10-CM | POA: Insufficient documentation

## 2015-03-07 DIAGNOSIS — Z86010 Personal history of colonic polyps: Secondary | ICD-10-CM | POA: Diagnosis not present

## 2015-03-07 DIAGNOSIS — Z79899 Other long term (current) drug therapy: Secondary | ICD-10-CM | POA: Insufficient documentation

## 2015-03-07 DIAGNOSIS — E785 Hyperlipidemia, unspecified: Secondary | ICD-10-CM | POA: Diagnosis not present

## 2015-03-07 DIAGNOSIS — Z87891 Personal history of nicotine dependence: Secondary | ICD-10-CM | POA: Insufficient documentation

## 2015-03-07 DIAGNOSIS — J45909 Unspecified asthma, uncomplicated: Secondary | ICD-10-CM | POA: Insufficient documentation

## 2015-03-07 DIAGNOSIS — M8580 Other specified disorders of bone density and structure, unspecified site: Secondary | ICD-10-CM | POA: Diagnosis not present

## 2015-03-07 DIAGNOSIS — E039 Hypothyroidism, unspecified: Secondary | ICD-10-CM | POA: Insufficient documentation

## 2015-03-07 DIAGNOSIS — Z85828 Personal history of other malignant neoplasm of skin: Secondary | ICD-10-CM | POA: Insufficient documentation

## 2015-03-07 DIAGNOSIS — K644 Residual hemorrhoidal skin tags: Secondary | ICD-10-CM | POA: Diagnosis not present

## 2015-03-07 DIAGNOSIS — Z8673 Personal history of transient ischemic attack (TIA), and cerebral infarction without residual deficits: Secondary | ICD-10-CM | POA: Diagnosis not present

## 2015-03-07 DIAGNOSIS — Z7951 Long term (current) use of inhaled steroids: Secondary | ICD-10-CM | POA: Insufficient documentation

## 2015-03-07 DIAGNOSIS — R4182 Altered mental status, unspecified: Secondary | ICD-10-CM | POA: Diagnosis present

## 2015-03-07 DIAGNOSIS — Z8742 Personal history of other diseases of the female genital tract: Secondary | ICD-10-CM | POA: Insufficient documentation

## 2015-03-07 HISTORY — DX: Cerebral infarction, unspecified: I63.9

## 2015-03-07 LAB — CBC
HEMATOCRIT: 39.6 % (ref 36.0–46.0)
Hemoglobin: 12.7 g/dL (ref 12.0–15.0)
MCH: 29.7 pg (ref 26.0–34.0)
MCHC: 32.1 g/dL (ref 30.0–36.0)
MCV: 92.7 fL (ref 78.0–100.0)
Platelets: 271 10*3/uL (ref 150–400)
RBC: 4.27 MIL/uL (ref 3.87–5.11)
RDW: 13.6 % (ref 11.5–15.5)
WBC: 7 10*3/uL (ref 4.0–10.5)

## 2015-03-07 LAB — COMPREHENSIVE METABOLIC PANEL
ALK PHOS: 82 U/L (ref 38–126)
ALT: 12 U/L — ABNORMAL LOW (ref 14–54)
ANION GAP: 6 (ref 5–15)
AST: 21 U/L (ref 15–41)
Albumin: 3.9 g/dL (ref 3.5–5.0)
BILIRUBIN TOTAL: 0.5 mg/dL (ref 0.3–1.2)
BUN: 15 mg/dL (ref 6–20)
CALCIUM: 8.9 mg/dL (ref 8.9–10.3)
CO2: 29 mmol/L (ref 22–32)
Chloride: 107 mmol/L (ref 101–111)
Creatinine, Ser: 0.76 mg/dL (ref 0.44–1.00)
GFR calc Af Amer: >60 mL/min (ref >60)
Glucose, Bld: 106 mg/dL — ABNORMAL HIGH (ref 65–99)
POTASSIUM: 4.4 mmol/L (ref 3.5–5.1)
Sodium: 142 mmol/L (ref 135–145)
TOTAL PROTEIN: 6.6 g/dL (ref 6.5–8.1)

## 2015-03-07 LAB — POC OCCULT BLOOD, ED: Fecal Occult Bld: NEGATIVE

## 2015-03-07 LAB — CBG MONITORING, ED: GLUCOSE-CAPILLARY: 101 mg/dL — AB (ref 65–99)

## 2015-03-07 NOTE — ED Notes (Signed)
Bed: WA02 Expected date:  Expected time:  Means of arrival:  Comments: Tester

## 2015-03-07 NOTE — ED Notes (Addendum)
Pt from [place] living facilities escorted by her female roommate. They both appear to be slightly confused because neither of them can answer all my questions appropriately. I called their SNF and spoke with the nurse on duty who did not know they had come to the ER because they're free to come and go as they please. The roommate states he brought her because of the blood streaked toilet paper. When asked if his roommate appeared more confused than normal he stated "Yea." When asked how long this had been going on for, he stated "Oh, I don't know, a year ago." The patient is confused, unknown what patient's baseline is. She is able to tell me her name, and read the name of the hospital on the board in the room. She believes it's the year 87. Pt denies N/V/D, abd pain w/ palpation, fever/chills. Nursing facility stated she does have a hx of a past stroke, and stated they were going to call the family members to see if they could come to the ER to assist with patient's care.

## 2015-03-07 NOTE — ED Provider Notes (Signed)
CSN: [id number]     Arrival date & time 03/07/15  1838 History   First MD Initiated Contact with Patient 03/07/15 1937     Chief Complaint  Patient presents with  . Altered Mental Status  . Rectal Bleeding   HPI   5 caveat due to dementia  81 year old female presents today with a bloody tissue from her underwear. Patient states that assisted-living facility was brought here by her demented significant other. Family was contacted who is there at the time of my evaluation. She was unaware of her visit to the emergency room and tell nursing staff called her. Patient has no complaints other than the bloody tissue, she denies any significant past history of vaginal or rectal bleeding. She denies fever, chills, abdominal pain, dizziness, or any other concerning signs or symptoms.  Past Medical History  Diagnosis Date  . Allergy     rhinitis  . Asthma   . Hypertension   . Hypothyroidism   . Hyperlipidemia   . Hx of adenomatous colonic polyps   . Diverticulosis   . Esophageal stricture   . Hemorrhoids   . PMB (postmenopausal bleeding)   . Tachycardia   . Cancer     skin- right shoulder-Squamous cell-legs  . Osteopenia   . DIVERTICULOSIS OF COLON 01/11/2008    Qualifier: Diagnosis of  By: [name], [name]   . Personal history of colonic polyps 01/11/2008    Qualifier: Diagnosis of  By: [name], [name]   . Stroke    Past Surgical History  Procedure Laterality Date  . Cataract extraction    . Nasal sinus surgery    . Shoulder surgery    . Tubal ligation  1959  . Hysteroscopy    . Dilation and curettage of uterus     Family History  Problem Relation Age of Onset  . Cancer Mother     bone  . Heart disease Brother   . Diabetes Brother   . Heart disease Brother   . Diabetes Brother    Social History  Substance Use Topics  . Smoking status: Former Research scientist (life sciences)  . Smokeless tobacco: Never Used  . Alcohol Use: 0.0 oz/week    0 Standard drinks or equivalent per week      Comment: rare   OB History    Gravida Para Term Preterm AB TAB SAB Ectopic Multiple Living   4 4 4       4      Review of Systems  Unable to perform ROS     Allergies  Procaine hcl  Home Medications   Prior to Admission medications   Medication Sig Start Date End Date Taking? Authorizing Provider  calcium carbonate (TUMS) 500 MG chewable tablet Chew 1 tablet (200 mg of elemental calcium total) by mouth 3 (three) times daily. Patient taking differently: Chew 1 tablet by mouth 3 (three) times daily as needed for indigestion.  04/29/13  Yes Kelvin Cellar, MD  escitalopram (LEXAPRO) 10 MG tablet Take 1 tablet (10 mg total) by mouth daily. 01/20/15  Yes Marin Olp, MD  fluticasone (FLONASE) 50 MCG/ACT nasal spray Place 2 sprays into the nose daily. Patient taking differently: Place 2 sprays into the nose daily as needed for allergies.  05/01/12  Yes Lucretia Kern, DO  furosemide (LASIX) 20 MG tablet TAKE ONE TABLET BY MOUTH ONCE DAILY. 10/28/14  Yes Marin Olp, MD  levothyroxine (SYNTHROID, LEVOTHROID) 100 MCG tablet TAKE ONE TABLET BY MOUTH ONCE DAILY  09/25/14  Yes Marin Olp, MD  lisinopril (PRINIVIL,ZESTRIL) 20 MG tablet TAKE ONE TABLET BY MOUTH ONCE DAILY 09/25/14  Yes Marin Olp, MD  loratadine (ALLERGY RELIEF) 10 MG tablet Take 10 mg by mouth daily as needed for allergies.    Yes Historical Provider, MD  metoprolol (LOPRESSOR) 50 MG tablet TAKE ONE-HALF TABLET BY MOUTH TWICE DAILY 09/25/14  Yes Marin Olp, MD  RABEprazole (ACIPHEX) 20 MG tablet TAKE ONE TABLET BY MOUTH ONCE DAILY 09/25/14  Yes Marin Olp, MD  rosuvastatin (CRESTOR) 20 MG tablet Take 1.5 tablets (30 mg total) by mouth every other day. Patient not taking: Reported on 03/07/2015 09/25/14   Marin Olp, MD   BP 181/71 mmHg  Pulse 79  Temp(Src) 98.4 F (36.9 C) (Oral)  Resp 18  SpO2 96%   Physical Exam  Constitutional: She is oriented to person, place, and time. She appears  well-developed and well-nourished.  HENT:  Head: Normocephalic and atraumatic.  Eyes: Conjunctivae are normal. Pupils are equal, round, and reactive to light. Right eye exhibits no discharge. Left eye exhibits no discharge. No scleral icterus.  Neck: Normal range of motion. No JVD present. No tracheal deviation present.  Cardiovascular: Normal rate.   Pulmonary/Chest: Effort normal. No stridor.  Abdominal: Soft. Bowel sounds are normal. She exhibits no distension and no mass. There is no tenderness. There is no rebound and no guarding.  Genitourinary: Vagina normal. Rectal exam shows external hemorrhoid. Rectal exam shows no internal hemorrhoid, no fissure, no mass, no tenderness and anal tone normal. Guaiac negative stool. No labial fusion. There is no rash, tenderness, lesion or injury on the right labia. There is no rash, tenderness, lesion or injury on the left labia. No erythema, tenderness or bleeding in the vagina. No foreign body around the vagina. No signs of injury around the vagina. No vaginal discharge found.  Neurological: She is alert and oriented to person, place, and time. Coordination normal.  Psychiatric: She has a normal mood and affect. Her behavior is normal. Judgment and thought content normal.  Nursing note and vitals reviewed.   ED Course  Procedures (including critical care time) Labs Review Labs Reviewed  COMPREHENSIVE METABOLIC PANEL - Abnormal; Notable for the following:    Glucose, Bld 106 (*)    ALT 12 (*)    All other components within normal limits  CBG MONITORING, ED - Abnormal; Notable for the following:    Glucose-Capillary 101 (*)    All other components within normal limits  CBC  POC OCCULT BLOOD, ED    Imaging Review No results found. I have personally reviewed and evaluated these images and lab results as part of my medical decision-making.   EKG Interpretation None      MDM   Final diagnoses:  Dementia, without behavioral disturbance     Labs: Point of care occult blood, CMP, CBC, CBG- no significant findings  Imaging:  Consults:  Therapeutics:  Discharge Meds:   Assessment/Plan: No significant findings on exam, no rectal bleeding, no vaginal bleeding, no source of the streak of blood found on the tissue paper. Patient has no significant complaints, family was present at time of evaluation reports that she is acting at her baseline mental status. Patient be discharged home to follow-up with her primary care for reevaluation. Patient's daughter understood and agreed to today's plan and had no further questions or concerns at the time of discharge.         Okey Regal,  PA-C 03/08/15 [id number]  Evelina Bucy, MD 03/08/15 [phone]

## 2015-03-07 NOTE — Discharge Instructions (Signed)
Please follow-up with your primary care provider for reevaluation of symptoms present.

## 2015-03-07 NOTE — ED Notes (Signed)
Pt states that she feels confused and husband states it started one year ago. Pt's husband states that she had a bloody stool. Pt denies v/n/d, problems urinating, constipation, abd tenderness.

## 2015-03-12 ENCOUNTER — Ambulatory Visit: Payer: Medicare Other | Admitting: Family Medicine

## 2015-03-20 ENCOUNTER — Ambulatory Visit: Payer: Medicare Other | Admitting: Family Medicine

## 2015-03-26 ENCOUNTER — Encounter: Payer: Self-pay | Admitting: Family Medicine

## 2015-03-26 ENCOUNTER — Ambulatory Visit (INDEPENDENT_AMBULATORY_CARE_PROVIDER_SITE_OTHER): Payer: Medicare Other | Admitting: Family Medicine

## 2015-03-26 VITALS — BP 150/80 | HR 65 | Temp 98.4°F | Wt 161.0 lb

## 2015-03-26 DIAGNOSIS — R410 Disorientation, unspecified: Secondary | ICD-10-CM | POA: Diagnosis not present

## 2015-03-26 DIAGNOSIS — N898 Other specified noninflammatory disorders of vagina: Secondary | ICD-10-CM | POA: Diagnosis not present

## 2015-03-26 DIAGNOSIS — N390 Urinary tract infection, site not specified: Secondary | ICD-10-CM | POA: Diagnosis not present

## 2015-03-26 DIAGNOSIS — N899 Noninflammatory disorder of vagina, unspecified: Secondary | ICD-10-CM | POA: Diagnosis not present

## 2015-03-26 LAB — POCT URINALYSIS DIPSTICK
Blood, UA: NEGATIVE
GLUCOSE UA: NEGATIVE
Nitrite, UA: NEGATIVE
PH UA: 6
Spec Grav, UA: 1.025
Urobilinogen, UA: 0.2

## 2015-03-26 NOTE — Patient Instructions (Signed)
We will call about the urine culture and whether she needs treatment

## 2015-03-26 NOTE — Progress Notes (Addendum)
Garret Reddish, MD  Subjective:  Robin Ferguson is a 81 y.o. year old very pleasant female patient who presents with:  Confusion, concernfor UTI Level 5 caveat applies due to dementia -Yesterday patient was noted to have some increased levels of agitation. She stays at [facility]. Patient was noted to be walking around without pants. Caretakers were concerned that she may have some irritation in her vagina or with her urine.  ROS- no reported fever or chills. No reported nausea or vomiting. No reported dysuria or polyuria or increase in incontinence.  Past Medical History-  Patient Active Problem List   Diagnosis Date Noted  . Do not resuscitate/ Do Not Intubate. Care Goals.  10/29/2014    Priority: High  . Dementia 04/28/2013    Priority: High  . Depression 10/29/2014    Priority: Medium  . Allergic rhinitis 10/29/2014    Priority: Medium  . Carotid stenosis 05/13/2013    Priority: Medium  . TIA (transient ischemic attack) 04/28/2013    Priority: Medium  . Hyperlipidemia 04/11/2007    Priority: Medium  . Hypothyroidism 01/16/2007    Priority: Medium  . Essential hypertension 01/16/2007    Priority: Medium  . GERD (gastroesophageal reflux disease) 10/29/2014    Priority: Low  . Osteopenia     Priority: Low  . Left bundle branch block 04/23/2007    Priority: Low  . SKIN CANCER, HX OF 01/16/2007    Priority: Low   Medications- reviewed and updated Current Outpatient Prescriptions  Medication Sig Dispense Refill  . calcium carbonate (TUMS) 500 MG chewable tablet Chew 1 tablet (200 mg of elemental calcium total) by mouth 3 (three) times daily. (Patient not taking: Reported on 03/26/2015) 30 tablet 0  . escitalopram (LEXAPRO) 10 MG tablet Take 1 tablet (10 mg total) by mouth daily. 90 tablet 3  . fluticasone (FLONASE) 50 MCG/ACT nasal spray Place 2 sprays into the nose daily. (Patient not taking: Reported on 03/26/2015) 16 g 3  . furosemide (LASIX) 20 MG tablet TAKE ONE  TABLET BY MOUTH ONCE DAILY. 30 tablet 0  . levothyroxine (SYNTHROID, LEVOTHROID) 100 MCG tablet TAKE ONE TABLET BY MOUTH ONCE DAILY 90 tablet 3  . lisinopril (PRINIVIL,ZESTRIL) 20 MG tablet TAKE ONE TABLET BY MOUTH ONCE DAILY 90 tablet 3  . loratadine (ALLERGY RELIEF) 10 MG tablet Take 10 mg by mouth daily as needed for allergies.     . metoprolol (LOPRESSOR) 50 MG tablet TAKE ONE-HALF TABLET BY MOUTH TWICE DAILY 90 tablet 3  . RABEprazole (ACIPHEX) 20 MG tablet TAKE ONE TABLET BY MOUTH ONCE DAILY 90 tablet 3  . rosuvastatin (CRESTOR) 20 MG tablet Take 1.5 tablets (30 mg total) by mouth every other day. 135 tablet 3   No current facility-administered medications for this visit.    Objective: BP 150/80 mmHg  Pulse 65  Temp(Src) 98.4 F (36.9 C)  Wt 161 lb (73.029 kg) Gen: NAD, resting comfortably CV: RRR no murmurs rubs or gallops Lungs: CTAB no crackles, wheeze, rhonchi Abdomen: soft/nontender specifically no suprapubic tenderness/nondistended/normal bowel sounds. No rebound or guarding.  No CVA tenderness Ext: no edema Skin: warm, dry Neuro: grossly normal, moves all extremities, baseline level of confusion  Results for orders placed or performed in visit on 03/26/15 (from the past 24 hour(s))  POC Urinalysis Dipstick     Status: Abnormal   Collection Time: 03/26/15 11:09 AM  Result Value Ref Range   Color, UA yellow    Clarity, UA clear  Glucose, UA neg    Bilirubin, UA 1+    Ketones, UA trace    Spec Grav, UA 1.025    Blood, UA neg    pH, UA 6.0    Protein, UA 1+    Urobilinogen, UA 0.2    Nitrite, UA neg    Leukocytes, UA Trace (A) Negative   Assessment/Plan:  Confusion (some increased agitation and not wearing pants yesterday), concernfor UTI Leukocytes main issue on urine. hesitant to start antibiotics without culture. We will culture first. Given level of dementia it is even difficult to use the mcgeer criteria. This could be asymptomatic bacteruria even if  positive but we agreed to treat if positive only.  this could also be related to her dementia primarily.   Return precautions advised.   Orders Placed This Encounter  Procedures  . Culture, Urine  . POC Urinalysis Dipstick

## 2015-03-28 LAB — URINE CULTURE
Colony Count: NO GROWTH
ORGANISM ID, BACTERIA: NO GROWTH

## 2015-03-31 NOTE — Addendum Note (Signed)
Addended by: Marin Olp on: 03/31/2015 08:01 AM   Modules accepted: SmartSet

## 2015-04-06 DIAGNOSIS — M6281 Muscle weakness (generalized): Secondary | ICD-10-CM | POA: Diagnosis not present

## 2015-04-06 DIAGNOSIS — M25511 Pain in right shoulder: Secondary | ICD-10-CM | POA: Diagnosis not present

## 2015-04-06 DIAGNOSIS — M25512 Pain in left shoulder: Secondary | ICD-10-CM | POA: Diagnosis not present

## 2015-04-06 DIAGNOSIS — M545 Low back pain: Secondary | ICD-10-CM | POA: Diagnosis not present

## 2015-04-07 DIAGNOSIS — M545 Low back pain: Secondary | ICD-10-CM | POA: Diagnosis not present

## 2015-04-07 DIAGNOSIS — M25511 Pain in right shoulder: Secondary | ICD-10-CM | POA: Diagnosis not present

## 2015-04-07 DIAGNOSIS — M25512 Pain in left shoulder: Secondary | ICD-10-CM | POA: Diagnosis not present

## 2015-04-07 DIAGNOSIS — M6281 Muscle weakness (generalized): Secondary | ICD-10-CM | POA: Diagnosis not present

## 2015-04-08 DIAGNOSIS — M6281 Muscle weakness (generalized): Secondary | ICD-10-CM | POA: Diagnosis not present

## 2015-04-08 DIAGNOSIS — M25512 Pain in left shoulder: Secondary | ICD-10-CM | POA: Diagnosis not present

## 2015-04-08 DIAGNOSIS — M25511 Pain in right shoulder: Secondary | ICD-10-CM | POA: Diagnosis not present

## 2015-04-08 DIAGNOSIS — M545 Low back pain: Secondary | ICD-10-CM | POA: Diagnosis not present

## 2015-04-10 ENCOUNTER — Ambulatory Visit (INDEPENDENT_AMBULATORY_CARE_PROVIDER_SITE_OTHER): Payer: Medicare Other | Admitting: Family Medicine

## 2015-04-10 VITALS — BP 132/64 | HR 74 | Temp 98.6°F | Resp 14 | Ht 61.0 in | Wt 165.6 lb

## 2015-04-10 DIAGNOSIS — L03113 Cellulitis of right upper limb: Secondary | ICD-10-CM

## 2015-04-10 DIAGNOSIS — S50811A Abrasion of right forearm, initial encounter: Secondary | ICD-10-CM

## 2015-04-10 DIAGNOSIS — M545 Low back pain: Secondary | ICD-10-CM | POA: Diagnosis not present

## 2015-04-10 DIAGNOSIS — L089 Local infection of the skin and subcutaneous tissue, unspecified: Secondary | ICD-10-CM

## 2015-04-10 DIAGNOSIS — M25512 Pain in left shoulder: Secondary | ICD-10-CM | POA: Diagnosis not present

## 2015-04-10 DIAGNOSIS — M6281 Muscle weakness (generalized): Secondary | ICD-10-CM | POA: Diagnosis not present

## 2015-04-10 DIAGNOSIS — M25511 Pain in right shoulder: Secondary | ICD-10-CM | POA: Diagnosis not present

## 2015-04-10 MED ORDER — CEPHALEXIN 250 MG PO CAPS: 250.0000 mg | ORAL_CAPSULE | Freq: Three times a day (TID) | ORAL | Status: DC

## 2015-04-10 NOTE — Progress Notes (Signed)
Procedure: verbal consent obtained. Wound cleaned with soap and water. Dead skin removed with scissors. 40% eschar removed with forceps. Xeroform placed. Wound dressing and wound care discussed.

## 2015-04-10 NOTE — Progress Notes (Signed)
Subjective:    Patient ID: Robin Ferguson, female    DOB: 26-Jul-1922, 81 y.o.   MRN: [id number] This chart was scribed for Delman Cheadle, MD by Marti Sleigh, Medical Scribe. This patient was seen in Room 11 and the patient's care was started a 4:46 PM.  Chief Complaint  Patient presents with  . Wound Infection    Right arm, pt fell 2 days ago and tore skin, wound appears infected per pts daughter  . Immunizations    Influenza vaccine    HPI HPI Comments: Robin Ferguson is a 81 y.o. female who presents to [facility] with a wound after falling two days ago. The pt lives a [facility] assisted living facility. The pt states she has not been in pain. She has NKDA. She denies fever or chills. She states she has some mild pain in her neck.   Past Medical History  Diagnosis Date  . Allergy     rhinitis  . Asthma   . Hypertension   . Hypothyroidism   . Hyperlipidemia   . Hx of adenomatous colonic polyps   . Diverticulosis   . Esophageal stricture   . Hemorrhoids   . PMB (postmenopausal bleeding)   . Tachycardia   . Cancer ([place])     skin- right shoulder-Squamous cell-legs  . Osteopenia   . DIVERTICULOSIS OF COLON 01/11/2008    Qualifier: Diagnosis of  By: [name], [name]   . Personal history of colonic polyps 01/11/2008    Qualifier: Diagnosis of  By: [name], [name]   . Stroke [facility])    Allergies  Allergen Reactions  . Procaine Hcl     REACTION: heart palpitationbs   Current Outpatient Prescriptions on File Prior to Visit  Medication Sig Dispense Refill  . escitalopram (LEXAPRO) 10 MG tablet Take 1 tablet (10 mg total) by mouth daily. 90 tablet 3  . fluticasone (FLONASE) 50 MCG/ACT nasal spray Place 2 sprays into the nose daily. 16 g 3  . furosemide (LASIX) 20 MG tablet TAKE ONE TABLET BY MOUTH ONCE DAILY. 30 tablet 0  . levothyroxine (SYNTHROID, LEVOTHROID) 100 MCG tablet TAKE ONE TABLET BY MOUTH ONCE DAILY 90 tablet 3  . lisinopril (PRINIVIL,ZESTRIL) 20 MG tablet TAKE  ONE TABLET BY MOUTH ONCE DAILY 90 tablet 3  . loratadine (ALLERGY RELIEF) 10 MG tablet Take 10 mg by mouth daily as needed for allergies.     . metoprolol (LOPRESSOR) 50 MG tablet TAKE ONE-HALF TABLET BY MOUTH TWICE DAILY 90 tablet 3  . RABEprazole (ACIPHEX) 20 MG tablet TAKE ONE TABLET BY MOUTH ONCE DAILY 90 tablet 3  . calcium carbonate (TUMS) 500 MG chewable tablet Chew 1 tablet (200 mg of elemental calcium total) by mouth 3 (three) times daily. (Patient not taking: Reported on 03/26/2015) 30 tablet 0  . rosuvastatin (CRESTOR) 20 MG tablet Take 1.5 tablets (30 mg total) by mouth every other day. (Patient not taking: Reported on 04/10/2015) 135 tablet 3   No current facility-administered medications on file prior to visit.   Immunization History  Administered Date(s) Administered  . Influenza Split 06/20/2011, 05/11/2012  . Influenza Whole 04/08/2005, 04/11/2007, 03/28/2008, 04/03/2009, 03/17/2010  . Influenza,inj,Quad PF,36+ Mos 04/03/2013, 05/05/2014  . Pneumococcal Conjugate-13 09/08/2014  . Pneumococcal Polysaccharide-23 07/04/2000  . Td 03/28/2008  . Tdap 11/24/2013    Review of Systems  Constitutional: Negative for fever, chills, activity change and appetite change.  Cardiovascular: Negative for leg swelling.  Musculoskeletal: Positive for arthralgias and  neck pain. Negative for joint swelling.  Skin: Positive for color change and wound.  Neurological: Negative for weakness.  Hematological: Bruises/bleeds easily.  Psychiatric/Behavioral: Positive for behavioral problems and confusion. Negative for agitation.       Objective:  BP 132/64 mmHg  Pulse 74  Temp(Src) 98.6 F (37 C) (Oral)  Resp 14  Ht 5\' 1"  (1.549 m)  Wt 165 lb 9.6 oz (75.116 kg)  BMI 31.31 kg/m2  SpO2 95%  Physical Exam  Constitutional: She is oriented to person, place, and time. She appears well-developed and well-nourished. No distress.  HENT:  Head: Normocephalic and atraumatic.  Eyes: Pupils are  equal, round, and reactive to light.  Neck: Neck supple.  Cardiovascular: Normal rate.   Pulmonary/Chest: Effort normal. No respiratory distress.  Musculoskeletal: Normal range of motion.  Neurological: She is alert and oriented to person, place, and time. Coordination normal.  Skin: Skin is warm and dry. She is not diaphoretic.  Large triangular shaped skin tear approximately 6cm/4cm. Yellow green purulent drainage.  Psychiatric: She has a normal mood and affect. Her behavior is normal.  Nursing note and vitals reviewed.     Assessment & Plan:   1. Cellulitis of right upper extremity   2. Abrasion of forearm with infection, right, initial encounter     Orders Placed This Encounter  Procedures  . Wound culture    Meds ordered this encounter  Medications  . cephALEXin (KEFLEX) 250 MG capsule    Sig: Take 1 capsule (250 mg total) by mouth 3 (three) times daily.    Dispense:  21 capsule    Refill:  0    I personally performed the services described in this documentation, which was scribed in my presence. The recorded information has been reviewed and considered, and addended by me as needed.  Delman Cheadle, MD MPH   By signing my name below, I, Judithe Modest, attest that this documentation has been prepared under the direction and in the presence of Delman Cheadle, MD. Electronically Signed: Judithe Modest, ER Scribe. 04/10/2015. 4:46 PM.

## 2015-04-10 NOTE — Patient Instructions (Signed)
Cellulitis Cellulitis is an infection of the skin and the tissue beneath it. The infected area is usually red and tender. Cellulitis occurs most often in the arms and lower legs.  CAUSES  Cellulitis is caused by bacteria that enter the skin through cracks or cuts in the skin. The most common types of bacteria that cause cellulitis are staphylococci and streptococci. SIGNS AND SYMPTOMS   Redness and warmth.  Swelling.  Tenderness or pain.  Fever. DIAGNOSIS  Your health care provider can usually determine what is wrong based on a physical exam. Blood tests may also be done. TREATMENT  Treatment usually involves taking an antibiotic medicine. HOME CARE INSTRUCTIONS   Take your antibiotic medicine as directed by your health care provider. Finish the antibiotic even if you start to feel better.  Keep the infected arm or leg elevated to reduce swelling.  Apply a warm cloth to the affected area up to 4 times per day to relieve pain.  Take medicines only as directed by your health care provider.  Keep all follow-up visits as directed by your health care provider. SEEK MEDICAL CARE IF:   You notice red streaks coming from the infected area.  Your red area gets larger or turns dark in color.  Your bone or joint underneath the infected area becomes painful after the skin has healed.  Your infection returns in the same area or another area.  You notice a swollen bump in the infected area.  You develop new symptoms.  You have a fever. SEEK IMMEDIATE MEDICAL CARE IF:   You feel very sleepy.  You develop vomiting or diarrhea.  You have a general ill feeling (malaise) with muscle aches and pains.   This information is not intended to replace advice given to you by your health care provider. Make sure you discuss any questions you have with your health care provider.   Document Released: 03/30/2005 Document Revised: 03/11/2015 Document Reviewed: 09/05/2011 Elsevier Interactive  Patient Education 2016 Elsevier Inc.  Skin Tear Care A skin tear is a wound in which the top layer of skin has peeled off. This is a common problem with aging because the skin becomes thinner and more fragile as a person gets older. In addition, some medicines, such as oral corticosteroids, can lead to skin thinning if taken for long periods of time.  A skin tear is often repaired with tape or skin adhesive strips. This keeps the skin that has been peeled off in contact with the healthier skin beneath. Depending on the location of the wound, a bandage (dressing) may be applied over the tape or skin adhesive strips. Sometimes, during the healing process, the skin turns black and dies. Even when this happens, the torn skin acts as a good dressing until the skin underneath gets healthier and repairs itself. HOME CARE INSTRUCTIONS   Change dressings once per day or as directed by your caregiver.  Gently clean the skin tear and the area around the tear using saline solution or mild soap and water.  Do not rub the injured skin dry. Let the area air dry.  Apply petroleum jelly or an antibiotic cream or ointment to keep the tear moist. This will help the wound heal. Do not allow a scab to form.  If the dressing sticks before the next dressing change, moisten it with warm soapy water and gently remove it.  Protect the injured skin until it has healed.  Only take over-the-counter or prescription medicines as directed by your  caregiver.  Take showers or baths using warm soapy water. Apply a new dressing after the shower or bath.  Keep all follow-up appointments as directed by your caregiver.  SEEK IMMEDIATE MEDICAL CARE IF:   You have redness, swelling, or increasing pain in the skin tear.  You havepus coming from the skin tear.  You have chills.  You have a red streak that goes away from the skin tear.  You have a bad smell coming from the tear or dressing.  You have a fever or  persistent symptoms for more than 2-3 days.  You have a fever and your symptoms suddenly get worse. MAKE SURE YOU:  Understand these instructions.  Will watch this condition.  Will get help right away if your child is not doing well or gets worse.   This information is not intended to replace advice given to you by your health care provider. Make sure you discuss any questions you have with your health care provider.   Document Released: 03/15/2001 Document Revised: 03/14/2012 Document Reviewed: 01/02/2012 Elsevier Interactive Patient Education [employer].

## 2015-04-13 ENCOUNTER — Ambulatory Visit: Payer: Self-pay | Admitting: Family Medicine

## 2015-04-14 DIAGNOSIS — M545 Low back pain: Secondary | ICD-10-CM | POA: Diagnosis not present

## 2015-04-14 DIAGNOSIS — M25511 Pain in right shoulder: Secondary | ICD-10-CM | POA: Diagnosis not present

## 2015-04-14 DIAGNOSIS — M6281 Muscle weakness (generalized): Secondary | ICD-10-CM | POA: Diagnosis not present

## 2015-04-14 DIAGNOSIS — M25512 Pain in left shoulder: Secondary | ICD-10-CM | POA: Diagnosis not present

## 2015-04-15 DIAGNOSIS — M545 Low back pain: Secondary | ICD-10-CM | POA: Diagnosis not present

## 2015-04-15 DIAGNOSIS — M6281 Muscle weakness (generalized): Secondary | ICD-10-CM | POA: Diagnosis not present

## 2015-04-15 DIAGNOSIS — M25512 Pain in left shoulder: Secondary | ICD-10-CM | POA: Diagnosis not present

## 2015-04-15 DIAGNOSIS — M25511 Pain in right shoulder: Secondary | ICD-10-CM | POA: Diagnosis not present

## 2015-04-16 ENCOUNTER — Telehealth: Payer: Self-pay | Admitting: Family Medicine

## 2015-04-16 ENCOUNTER — Ambulatory Visit: Payer: Medicare Other | Admitting: Family Medicine

## 2015-04-16 NOTE — Telephone Encounter (Signed)
See below Mrs.Robin Ferguson 

## 2015-04-16 NOTE — Telephone Encounter (Signed)
See below

## 2015-04-16 NOTE — Telephone Encounter (Signed)
Pt scheduled  

## 2015-04-16 NOTE — Telephone Encounter (Signed)
Can use 11:15 slot- tell her to arrive at 49

## 2015-04-16 NOTE — Telephone Encounter (Signed)
Daughter called because patient has a wound that could possibly be infected.  They were hoping you could see her today or tomorrow. If not I can let her see someone else. Pleas advise

## 2015-04-17 ENCOUNTER — Encounter: Payer: Self-pay | Admitting: Family Medicine

## 2015-04-17 ENCOUNTER — Ambulatory Visit (INDEPENDENT_AMBULATORY_CARE_PROVIDER_SITE_OTHER): Payer: Medicare Other | Admitting: Family Medicine

## 2015-04-17 VITALS — BP 128/88 | HR 74 | Temp 98.5°F | Ht 61.0 in | Wt 164.0 lb

## 2015-04-17 DIAGNOSIS — L089 Local infection of the skin and subcutaneous tissue, unspecified: Secondary | ICD-10-CM

## 2015-04-17 DIAGNOSIS — I6523 Occlusion and stenosis of bilateral carotid arteries: Secondary | ICD-10-CM

## 2015-04-17 DIAGNOSIS — S50811A Abrasion of right forearm, initial encounter: Secondary | ICD-10-CM | POA: Diagnosis not present

## 2015-04-17 DIAGNOSIS — Z23 Encounter for immunization: Secondary | ICD-10-CM

## 2015-04-17 DIAGNOSIS — L03113 Cellulitis of right upper limb: Secondary | ICD-10-CM

## 2015-04-17 NOTE — Progress Notes (Signed)
Garret Reddish, MD  Subjective:  Robin Ferguson is a 81 y.o. year old very pleasant female patient who presents for/with See problem oriented charting ROS- level 5 caveat applies due to dementia No reported pain, pus, expanding redness from daughter  Past Medical History-  Patient Active Problem List   Diagnosis Date Noted  . Do not resuscitate/ Do Not Intubate. Care Goals.  10/29/2014    Priority: High  . Dementia 04/28/2013    Priority: High  . Depression 10/29/2014    Priority: Medium  . Allergic rhinitis 10/29/2014    Priority: Medium  . Carotid stenosis 05/13/2013    Priority: Medium  . TIA (transient ischemic attack) 04/28/2013    Priority: Medium  . Hyperlipidemia 04/11/2007    Priority: Medium  . Hypothyroidism 01/16/2007    Priority: Medium  . Essential hypertension 01/16/2007    Priority: Medium  . GERD (gastroesophageal reflux disease) 10/29/2014    Priority: Low  . Osteopenia     Priority: Low  . Left bundle branch block 04/23/2007    Priority: Low  . SKIN CANCER, HX OF 01/16/2007    Priority: Low    Medications- reviewed and updated Current Outpatient Prescriptions  Medication Sig Dispense Refill  . calcium carbonate (TUMS) 500 MG chewable tablet Chew 1 tablet (200 mg of elemental calcium total) by mouth 3 (three) times daily. (Patient not taking: Reported on 03/26/2015) 30 tablet 0  . cephALEXin (KEFLEX) 250 MG capsule Take 1 capsule (250 mg total) by mouth 3 (three) times daily. 21 capsule 0  . escitalopram (LEXAPRO) 10 MG tablet Take 1 tablet (10 mg total) by mouth daily. 90 tablet 3  . fluticasone (FLONASE) 50 MCG/ACT nasal spray Place 2 sprays into the nose daily. 16 g 3  . furosemide (LASIX) 20 MG tablet TAKE ONE TABLET BY MOUTH ONCE DAILY. 30 tablet 0  . levothyroxine (SYNTHROID, LEVOTHROID) 100 MCG tablet TAKE ONE TABLET BY MOUTH ONCE DAILY 90 tablet 3  . lisinopril (PRINIVIL,ZESTRIL) 20 MG tablet TAKE ONE TABLET BY MOUTH ONCE DAILY 90 tablet 3  .  loratadine (ALLERGY RELIEF) 10 MG tablet Take 10 mg by mouth daily as needed for allergies.     . metoprolol (LOPRESSOR) 50 MG tablet TAKE ONE-HALF TABLET BY MOUTH TWICE DAILY 90 tablet 3  . RABEprazole (ACIPHEX) 20 MG tablet TAKE ONE TABLET BY MOUTH ONCE DAILY 90 tablet 3  . rosuvastatin (CRESTOR) 20 MG tablet Take 1.5 tablets (30 mg total) by mouth every other day. (Patient not taking: Reported on 04/10/2015) 135 tablet 3   Objective: BP 128/88 mmHg  Pulse 74  Temp(Src) 98.5 F (36.9 C) (Oral)  Ht 5\' 1"  (1.549 m)  Wt 164 lb (74.39 kg)  BMI 31.00 kg/m2  SpO2 97% Gen: NAD, resting comfortably CV: RRR no murmurs rubs or gallops Lungs: CTAB no crackles, wheeze, rhonchi Abdomen: soft/nontender/nondistended/normal bowel sounds. Ext: no edema Skin: warm, dry  Laceration to right upper forearm- healing well- good granulation tissue, no purulent drainage.  Scab forming. Mild surrounding warmth but no tenderness, no expanding redness  Neuro: grossly normal, moves all extremities   Assessment/Plan:  Wound, right arm with concern infection Cellulitis follow up S: Seen in urgent care a week ago afer a fall at [facility]. She had fallen 2 days prior and had large area of torn skin and there was concern for infection. Patient was placed on keflex for 7 days which she has not yet completely finished. Report from [facility]  with concern of infection with potential purulence A/P: wound looks great today and appears to be healing. Advised finish keflex and return to care for expanding redness, fever, chills. Cellulitis appears appropriately treated. Applied telfa and advised to use this instead of anything with moisture- suspect moisture may have given false impression of infection.   Orders Placed This Encounter  Procedures  . Flu vaccine HIGH DOSE PF (Fluzone High dose)

## 2015-04-17 NOTE — Progress Notes (Signed)
Pre visit review using our clinic review tool, if applicable. No additional management support is needed unless otherwise documented below in the visit note. 

## 2015-04-17 NOTE — Patient Instructions (Signed)
Does not look infected  Would avoid moisture- No neosporin  Use telfa nonadherent dressing   Change daily- wash area with change

## 2015-04-26 ENCOUNTER — Encounter: Payer: Self-pay | Admitting: Family Medicine

## 2015-06-15 ENCOUNTER — Telehealth: Payer: Self-pay

## 2015-06-15 NOTE — Telephone Encounter (Signed)
Please schedule pt OV regarding FL2 paper work per Dr. Yong Channel.

## 2015-06-15 NOTE — Telephone Encounter (Signed)
Please schedule pt OV regarding FL2 paper work to be filled out per Dr. Yong Channel.

## 2015-06-15 NOTE — Telephone Encounter (Signed)
Pt scheduled for Wednesday 06/17/15

## 2015-06-17 ENCOUNTER — Encounter: Payer: Self-pay | Admitting: Family Medicine

## 2015-06-17 ENCOUNTER — Ambulatory Visit (INDEPENDENT_AMBULATORY_CARE_PROVIDER_SITE_OTHER): Payer: Medicare Other | Admitting: Family Medicine

## 2015-06-17 VITALS — BP 124/72 | HR 67 | Temp 98.6°F | Wt 165.0 lb

## 2015-06-17 DIAGNOSIS — E785 Hyperlipidemia, unspecified: Secondary | ICD-10-CM | POA: Diagnosis not present

## 2015-06-17 DIAGNOSIS — Z0000 Encounter for general adult medical examination without abnormal findings: Secondary | ICD-10-CM

## 2015-06-17 DIAGNOSIS — F0391 Unspecified dementia with behavioral disturbance: Secondary | ICD-10-CM

## 2015-06-17 DIAGNOSIS — E039 Hypothyroidism, unspecified: Secondary | ICD-10-CM | POA: Diagnosis not present

## 2015-06-17 NOTE — Assessment & Plan Note (Addendum)
S:Lives at [facility] with a female partner (together 4 years), he has been very helpful in continuing independent care. Patient does appear to have additional needs at this time though that would be better cared for in memory care unit at [facility].  A/P: FL2 was completed at this time. Continue without medication. Dementia is severe. Did not attempt MMSE. lexapro does appear to help with mood. In regards to form needs- patient needs 2 step TB form which will be completed.

## 2015-06-17 NOTE — Progress Notes (Signed)
Garret Reddish, MD  Subjective:  Robin Ferguson is a 81 y.o. year old very pleasant female patient who presents for/with See problem oriented charting ROS- level 5 caveat applies due to dementia  Past Medical History-  Patient Active Problem List   Diagnosis Date Noted  . Do not resuscitate/ Do Not Intubate. Care Goals.  10/29/2014    Priority: High  . Dementia 04/28/2013    Priority: High  . Depression 10/29/2014    Priority: Medium  . Allergic rhinitis 10/29/2014    Priority: Medium  . Carotid stenosis 05/13/2013    Priority: Medium  . TIA (transient ischemic attack) 04/28/2013    Priority: Medium  . Hyperlipidemia 04/11/2007    Priority: Medium  . Hypothyroidism 01/16/2007    Priority: Medium  . Essential hypertension 01/16/2007    Priority: Medium  . GERD (gastroesophageal reflux disease) 10/29/2014    Priority: Low  . Osteopenia     Priority: Low  . Left bundle branch block 04/23/2007    Priority: Low  . SKIN CANCER, HX OF 01/16/2007    Priority: Low    Medications- reviewed and updated Current Outpatient Prescriptions  Medication Sig Dispense Refill  . calcium carbonate (TUMS) 500 MG chewable tablet Chew 1 tablet (200 mg of elemental calcium total) by mouth 3 (three) times daily. 30 tablet 0  . escitalopram (LEXAPRO) 10 MG tablet Take 1 tablet (10 mg total) by mouth daily. 90 tablet 3  . fluticasone (FLONASE) 50 MCG/ACT nasal spray Place 2 sprays into the nose daily. 16 g 3  . furosemide (LASIX) 20 MG tablet TAKE ONE TABLET BY MOUTH ONCE DAILY. 30 tablet 0  . levothyroxine (SYNTHROID, LEVOTHROID) 100 MCG tablet TAKE ONE TABLET BY MOUTH ONCE DAILY 90 tablet 3  . lisinopril (PRINIVIL,ZESTRIL) 20 MG tablet TAKE ONE TABLET BY MOUTH ONCE DAILY 90 tablet 3  . loratadine (ALLERGY RELIEF) 10 MG tablet Take 10 mg by mouth daily as needed for allergies.     . metoprolol (LOPRESSOR) 50 MG tablet TAKE ONE-HALF TABLET BY MOUTH TWICE DAILY 90 tablet 3  . RABEprazole (ACIPHEX)  20 MG tablet TAKE ONE TABLET BY MOUTH ONCE DAILY 90 tablet 3   No current facility-administered medications for this visit.    Objective: BP 124/72 mmHg  Pulse 67  Temp(Src) 98.6 F (37 C)  Wt 165 lb (74.844 kg) Gen: NAD, resting comfortably CV: RRR no murmurs rubs or gallops Lungs: CTAB no crackles, wheeze, rhonchi Abdomen: soft/nontender/nondistended/normal bowel sounds. No rebound or guarding.  Ext: trace edema Skin: warm, dry Neuro: pleasantly confused  Assessment/Plan:  Dementia S:Lives at [facility] with a female partner (together 4 years), he has been very helpful in continuing independent care. Patient does appear to have additional needs at this time though that would be better cared for in memory care unit at [facility].  A/P: FL2 was completed at this time. Continue without medication. Dementia is severe. Did not attempt MMSE. lexapro does appear to help with mood. In regards to form needs- patient needs 2 step TB form which will be completed.    Hyperlipidemia S: family discussed and agreed to discontinue crestor 20mg  A/P: removed from medicine list but already had been placed on FL2 and was crossed off. Likely will need to resubmit. No primary prevention and DNR/DNI at this point. Had previously stopped aspirin   Hypothyroidism S: controlled on levothyroxine 120mcg A/P: continue current rx   Immunization History  Administered Date(s) Administered  . Influenza Split 06/20/2011,  05/11/2012  . Influenza Whole 04/08/2005, 04/11/2007, 03/28/2008, 04/03/2009, 03/17/2010  . Influenza, High Dose Seasonal PF 04/17/2015  . Influenza,inj,Quad PF,36+ Mos 04/03/2013, 05/05/2014  . Pneumococcal Conjugate-13 09/08/2014  . Pneumococcal Polysaccharide-23 07/04/2000  . Td 03/28/2008  . Tdap 11/24/2013  2 step TB test started today

## 2015-06-17 NOTE — Assessment & Plan Note (Signed)
S: controlled on levothyroxine 112mcg A/P: continue current rx

## 2015-06-17 NOTE — Patient Instructions (Signed)
Happy to resubmit FL2 with changes without another visitnow that we have had initial meeting   Return in 48 hours to have TB read and appears you will need one more round of testing which keba will inform you of  Crossed out crestor- likely have to resubmit the FL2

## 2015-06-17 NOTE — Assessment & Plan Note (Signed)
S: family discussed and agreed to discontinue crestor 20mg  A/P: removed from medicine list but already had been placed on FL2 and was crossed off. Likely will need to resubmit. No primary prevention and DNR/DNI at this point. Had previously stopped aspirin

## 2015-06-19 LAB — TB SKIN TEST
Induration: 0 mm
TB Skin Test: NEGATIVE

## 2015-06-19 NOTE — Addendum Note (Signed)
Addended by: Clyde Lundborg A on: 06/19/2015 11:39 AM   Modules accepted: Orders

## 2015-06-22 ENCOUNTER — Other Ambulatory Visit: Payer: Self-pay

## 2015-06-24 ENCOUNTER — Ambulatory Visit: Payer: Medicare Other | Admitting: Family Medicine

## 2015-06-26 ENCOUNTER — Telehealth: Payer: Self-pay | Admitting: Family Medicine

## 2015-06-26 NOTE — Telephone Encounter (Signed)
Robin Ferguson states should the patient be on Flonase and Tums because those medications did not carry over when the pt came to the facility. Also, the pt daughter dropped off an EPI pen today which we dont have on her med list and pt daughter does not know pt allergies or what pt would need this for. Were you aware of her having an Epi pen?

## 2015-06-26 NOTE — Telephone Encounter (Signed)
LM for Lacie to return my call.

## 2015-06-26 NOTE — Telephone Encounter (Signed)
I sent orders yesterday. For metoprolol it is tartrate.

## 2015-06-26 NOTE — Telephone Encounter (Signed)
RN calling to clarify if patient is to be on Lasix. (I see where it was d/c's from her med list but no documentation as to why) Additional clarification is needed on Metoprolol (tartrae vs succionate). They have orders for Flonase and Tums but family has provided any - is she still supposed to have that?

## 2015-06-26 NOTE — Telephone Encounter (Signed)
May stop tums and flonase. Can readdress if symptoms. Not sure why she would need epi pen.

## 2015-06-26 NOTE — Telephone Encounter (Signed)
See below

## 2015-06-30 NOTE — Telephone Encounter (Signed)
Called and left message on Lacie vm tcb regarding faxed orders on 06/26/15.

## 2015-07-20 ENCOUNTER — Telehealth: Payer: Self-pay | Admitting: Family Medicine

## 2015-07-20 ENCOUNTER — Other Ambulatory Visit: Payer: Self-pay

## 2015-07-20 DIAGNOSIS — J309 Allergic rhinitis, unspecified: Secondary | ICD-10-CM

## 2015-07-20 MED ORDER — RABEPRAZOLE SODIUM 20 MG PO TBEC: DELAYED_RELEASE_TABLET | ORAL | Status: DC

## 2015-07-20 MED ORDER — METOPROLOL TARTRATE 50 MG PO TABS: ORAL_TABLET | ORAL | Status: DC

## 2015-07-20 MED ORDER — FLUTICASONE PROPIONATE 50 MCG/ACT NA SUSP: 2.0000 | Freq: Every day | NASAL | Status: DC

## 2015-07-20 MED ORDER — LISINOPRIL 20 MG PO TABS: ORAL_TABLET | ORAL | Status: DC

## 2015-07-20 MED ORDER — ESCITALOPRAM OXALATE 10 MG PO TABS: 10.0000 mg | ORAL_TABLET | Freq: Every day | ORAL | Status: DC

## 2015-07-20 MED ORDER — LEVOTHYROXINE SODIUM 100 MCG PO TABS: ORAL_TABLET | ORAL | Status: DC

## 2015-07-20 NOTE — Telephone Encounter (Signed)
Medications refilled to [facility].

## 2015-07-20 NOTE — Telephone Encounter (Signed)
The patient needs all of her Rx refilled and sent to  [facility], [place] [phone] (Phone) [phone] (Fax)      [name] was wondering if the patient needs to have an appointment to get her Rx refilled.

## 2015-07-21 ENCOUNTER — Other Ambulatory Visit: Payer: Self-pay

## 2015-07-21 MED ORDER — FUROSEMIDE 20 MG PO TABS: ORAL_TABLET | ORAL | Status: DC

## 2015-11-19 ENCOUNTER — Encounter: Payer: Self-pay | Admitting: Family Medicine

## 2015-11-24 ENCOUNTER — Other Ambulatory Visit: Payer: Self-pay | Admitting: Pediatrics

## 2015-11-24 MED ORDER — ESCITALOPRAM OXALATE 20 MG PO TABS: 20.0000 mg | ORAL_TABLET | Freq: Every day | ORAL | Status: DC

## 2015-11-24 NOTE — Telephone Encounter (Signed)
Can increase medication and communicate with [facility]. Will give her 4 week trial then advise updating me.

## 2015-11-24 NOTE — Telephone Encounter (Signed)
Lexpro 20 mg sent to [facility]. [address]  If you have questions, lease do not hesitate to call.

## 2015-11-26 NOTE — Telephone Encounter (Signed)
Pts daughter has picked up rx. Dr Yong Channel in process of contacting facility w/ new order.

## 2015-11-27 NOTE — Telephone Encounter (Signed)
I faxed order for Lexapro 20mg  1 QD and DC Lexapro 10mg  1QD per Dr Ronney Lion request.

## 2015-12-01 ENCOUNTER — Ambulatory Visit (INDEPENDENT_AMBULATORY_CARE_PROVIDER_SITE_OTHER): Payer: Medicare Other | Admitting: Family Medicine

## 2015-12-01 ENCOUNTER — Encounter: Payer: Self-pay | Admitting: Family Medicine

## 2015-12-01 VITALS — BP 142/82 | HR 116 | Temp 98.7°F | Wt 161.0 lb

## 2015-12-01 DIAGNOSIS — M25532 Pain in left wrist: Secondary | ICD-10-CM | POA: Diagnosis not present

## 2015-12-01 DIAGNOSIS — M79642 Pain in left hand: Secondary | ICD-10-CM | POA: Diagnosis not present

## 2015-12-01 MED ORDER — DOXYCYCLINE HYCLATE 100 MG PO TABS: 100.0000 mg | ORAL_TABLET | Freq: Two times a day (BID) | ORAL | Status: DC

## 2015-12-01 NOTE — Patient Instructions (Addendum)
Stat referral to orthopedics given left wrist pain on exam. This could be a cellulitis but I am worried about fracture even more given tenderness on exam.   Today at 2:00 Dr Linna Hoff [facility]   Address: [address], [place], [place] [address] Phone: [phone]   I gave an antibiotic that i want her to take only if there is no fracture and then I would advise 2 day follow up with me

## 2015-12-01 NOTE — Progress Notes (Signed)
Subjective:  Robin Ferguson is a 82 y.o. year old very pleasant female patient who presents for/with See problem oriented charting ROS- level 5 caveat applies.see any ROS included in HPI as well.   Past Medical History-  Patient Active Problem List   Diagnosis Date Noted  . Do not resuscitate/ Do Not Intubate. Care Goals.  10/29/2014    Priority: High  . Dementia 04/28/2013    Priority: High  . Depression 10/29/2014    Priority: Medium  . Allergic rhinitis 10/29/2014    Priority: Medium  . Carotid stenosis 05/13/2013    Priority: Medium  . TIA (transient ischemic attack) 04/28/2013    Priority: Medium  . Hyperlipidemia 04/11/2007    Priority: Medium  . Hypothyroidism 01/16/2007    Priority: Medium  . Essential hypertension 01/16/2007    Priority: Medium  . GERD (gastroesophageal reflux disease) 10/29/2014    Priority: Low  . Osteopenia     Priority: Low  . Left bundle branch block 04/23/2007    Priority: Low  . SKIN CANCER, HX OF 01/16/2007    Priority: Low    Medications- reviewed and updated Current Outpatient Prescriptions  Medication Sig Dispense Refill  . calcium carbonate (TUMS) 500 MG chewable tablet Chew 1 tablet (200 mg of elemental calcium total) by mouth 3 (three) times daily. 30 tablet 0  . doxycycline (VIBRA-TABS) 100 MG tablet Take 1 tablet (100 mg total) by mouth 2 (two) times daily. 14 tablet 0  . escitalopram (LEXAPRO) 20 MG tablet Take 1 tablet (20 mg total) by mouth daily. 30 tablet 5  . fluticasone (FLONASE) 50 MCG/ACT nasal spray Place 2 sprays into both nostrils daily. 16 g 3  . furosemide (LASIX) 20 MG tablet TAKE ONE TABLET BY MOUTH ONCE DAILY. 90 tablet 3  . levothyroxine (SYNTHROID, LEVOTHROID) 100 MCG tablet TAKE ONE TABLET BY MOUTH ONCE DAILY 90 tablet 3  . lisinopril (PRINIVIL,ZESTRIL) 20 MG tablet TAKE ONE TABLET BY MOUTH ONCE DAILY 90 tablet 3  . loratadine (ALLERGY RELIEF) 10 MG tablet Take 10 mg by mouth daily as needed for allergies.      . metoprolol (LOPRESSOR) 50 MG tablet TAKE ONE-HALF TABLET BY MOUTH TWICE DAILY 90 tablet 3  . RABEprazole (ACIPHEX) 20 MG tablet TAKE ONE TABLET BY MOUTH ONCE DAILY 90 tablet 3   No current facility-administered medications for this visit.    Objective: BP 142/82 mmHg  Pulse 116  Temp(Src) 98.7 F (37.1 C) (Oral)  Wt 161 lb (73.029 kg)  SpO2 95% Gen: NAD, resting comfortably CV: RRR no murmurs rubs or gallops Lungs: CTAB no crackles, wheeze, rhonchi Ext: no edema right arm. Left arm with edema and erythema as well as warmth up midway through forearm. She is particularly tender with wrist flexion and extension as well as palpation in central portion of dorsal side of wrist (high concern fracture) Skin: warm, dry  Assessment/Plan:  Left wrist pain - Plan: Ambulatory referral to Orthopedic Surgery S: Daughter was called this morning due to swelling and redness in left hand op midway point of forearm. Other sister was with patient on Sunday so was not there then. No reported falls from facility patient is cared for. Level 5 caveat applies due to advanced dementia. Patient holds left hand up with right seemingly to help avoid pain.  A/P: Arm is warm, swollen, erythematous and my initial concern was cellulitis. She is very tender on dorsal side of wrist with palpation in central area. Also with flexion and  extension of wrist she grimaces in pain. No other area of palpation produces this level of pain. Stat referral to orthopedist and will be seen today. i did cover with doxycycline if there is no fracture found as cellulitis second highest on differential.   2 days with me if not  Orthopedic issue. Return precautions advised.   Daughter also mentioned some left leg swelling which I could not appreciate on exam. Calves were equal 10 cm below tibial plateau and no reported calf pain  Orders Placed This Encounter  Procedures  . Ambulatory referral to Orthopedic Surgery    Referral Priority:   Urgent    Referral Type:  Surgical    Referral Reason:  Specialty Services Required    Requested Specialty:  Orthopedic Surgery    Number of Visits Requested:  1    Meds ordered this encounter  Medications  . doxycycline (VIBRA-TABS) 100 MG tablet    Sig: Take 1 tablet (100 mg total) by mouth 2 (two) times daily.    Dispense:  14 tablet    Refill:  0   The duration of face-to-face time during this visit was 25 minutes. Greater than 50% of this time was spent in counseling, explanation of diagnosis, planning of further management, and/or coordination of care.   Garret Reddish, MD

## 2015-12-02 ENCOUNTER — Telehealth: Payer: Self-pay | Admitting: Family Medicine

## 2015-12-02 NOTE — Telephone Encounter (Signed)
Patient Name: Robin Ferguson  DOB: 1923/06/21    Initial Comment Mother was seen yesterday and she has cellulites in her hand. She was given medication for it, was told to wait 48 hours. Today it looks worse   Nurse Assessment  Nurse: Leilani Merl, RN, Heather Date/Time (Eastern Time): 12/02/2015 2:25:55 PM  Confirm and document reason for call. If symptomatic, describe symptoms. You must click the next button to save text entered. ---Mother was seen yesterday and she has cellulitis in her hand. She was given medication for it, was told to wait 48 hours. Today it looks worse  Has the patient traveled out of the country within the last 30 days? ---Not Applicable  Does the patient have any new or worsening symptoms? ---Yes  Will a triage be completed? ---Yes  Related visit to physician within the last 2 weeks? ---Yes  Does the PT have any chronic conditions? (i.e. diabetes, asthma, etc.) ---Yes  List chronic conditions. ---See MR  Is this a behavioral health or substance abuse call? ---No     Guidelines    Guideline Title Affirmed Question Affirmed Notes  Cellulitis on Antibiotic Follow-up Call [1] Taking antibiotic < 24 hours AND [2] cellulitis symptoms are WORSE (e.g., spreading redness, pain, swelling, drainage) AND [3] no fever    Final Disposition User   Call PCP within 24 Hours [place], RN, Conserv[employer], [employer] states that she thinks that the area is slightly more swollen than yesterday and the redness may have spread slightly since yesterday, she is not positive about that. She will wait another 24 hours unless her mother develops a fever or the cellulitis worsens.   Referrals  REFERRED TO PCP OFFICE   Disagree/Comply: Comply

## 2015-12-02 NOTE — Telephone Encounter (Signed)
Spoke to [place] (daughter) who called in.  Robin Ferguson admitted she was somewhat anxious.  She believed that the swelling could be getting worse but unsure.  Asked [name] if pt has any fever, nausea, vomiting, chills or trouble moving the hand.  [name] denied all but did state the hand is very sore so the pt is not moving it unless necessary.  Advised that Dr. Yong Channel recommended a follow up in 48 hours for re check.  See ov note.  This was very reassuring to [name] and an appt for 12/03/15 @ 4 PM has been scheduled.  [name] did state that her other sister would be checking in on the pt later this afternoon and will check her temperature and ask about the other sx listed above.  Will take her to the ED if necessary.  Advised that sounded like a great plan and Dr Yong Channel will see her tomorrow.

## 2015-12-03 ENCOUNTER — Ambulatory Visit (INDEPENDENT_AMBULATORY_CARE_PROVIDER_SITE_OTHER): Payer: Medicare Other | Admitting: Family Medicine

## 2015-12-03 ENCOUNTER — Encounter: Payer: Self-pay | Admitting: Family Medicine

## 2015-12-03 VITALS — BP 150/92 | HR 64 | Temp 98.7°F | Wt 167.0 lb

## 2015-12-03 DIAGNOSIS — L03114 Cellulitis of left upper limb: Secondary | ICD-10-CM

## 2015-12-03 MED ORDER — CEPHALEXIN 500 MG PO CAPS: 500.0000 mg | ORAL_CAPSULE | Freq: Four times a day (QID) | ORAL | Status: DC

## 2015-12-03 NOTE — Patient Instructions (Signed)
Continue doxycycline  Add Keflex 500mg  4x a day.   If she is not improving by Saturday morning or seems to be worsening before then- would take her to the hospital for likely IV antibiotics and advanced testing/imaging and potential hand surgeon evaluation

## 2015-12-03 NOTE — Progress Notes (Signed)
Subjective:  Robin Ferguson is a 82 y.o. year old very pleasant female patient who presents for/with See problem oriented charting ROS- level 5 caveat applies.see any ROS included in HPI as well.   Past Medical History-  Patient Active Problem List   Diagnosis Date Noted  . Do not resuscitate/ Do Not Intubate. Care Goals.  10/29/2014    Priority: High  . Dementia 04/28/2013    Priority: High  . Depression 10/29/2014    Priority: Medium  . Allergic rhinitis 10/29/2014    Priority: Medium  . Carotid stenosis 05/13/2013    Priority: Medium  . TIA (transient ischemic attack) 04/28/2013    Priority: Medium  . Hyperlipidemia 04/11/2007    Priority: Medium  . Hypothyroidism 01/16/2007    Priority: Medium  . Essential hypertension 01/16/2007    Priority: Medium  . GERD (gastroesophageal reflux disease) 10/29/2014    Priority: Low  . Osteopenia     Priority: Low  . Left bundle branch block 04/23/2007    Priority: Low  . SKIN CANCER, HX OF 01/16/2007    Priority: Low    Medications- reviewed and updated Current Outpatient Prescriptions  Medication Sig Dispense Refill  . calcium carbonate (TUMS) 500 MG chewable tablet Chew 1 tablet (200 mg of elemental calcium total) by mouth 3 (three) times daily. 30 tablet 0  . cephALEXin (KEFLEX) 500 MG capsule Take 1 capsule (500 mg total) by mouth 4 (four) times daily. 28 capsule 0  . doxycycline (VIBRA-TABS) 100 MG tablet Take 1 tablet (100 mg total) by mouth 2 (two) times daily. 14 tablet 0  . escitalopram (LEXAPRO) 20 MG tablet Take 1 tablet (20 mg total) by mouth daily. 30 tablet 5  . fluticasone (FLONASE) 50 MCG/ACT nasal spray Place 2 sprays into both nostrils daily. 16 g 3  . furosemide (LASIX) 20 MG tablet TAKE ONE TABLET BY MOUTH ONCE DAILY. 90 tablet 3  . levothyroxine (SYNTHROID, LEVOTHROID) 100 MCG tablet TAKE ONE TABLET BY MOUTH ONCE DAILY 90 tablet 3  . lisinopril (PRINIVIL,ZESTRIL) 20 MG tablet TAKE ONE TABLET BY MOUTH ONCE  DAILY 90 tablet 3  . loratadine (ALLERGY RELIEF) 10 MG tablet Take 10 mg by mouth daily as needed for allergies.     . metoprolol (LOPRESSOR) 50 MG tablet TAKE ONE-HALF TABLET BY MOUTH TWICE DAILY 90 tablet 3  . RABEprazole (ACIPHEX) 20 MG tablet TAKE ONE TABLET BY MOUTH ONCE DAILY 90 tablet 3   No current facility-administered medications for this visit.    Objective: BP 150/92 mmHg  Pulse 64  Temp(Src) 98.7 F (37.1 C) (Oral)  Wt 167 lb (75.751 kg)  SpO2 97% Gen: NAD, resting comfortably CV: RRR no murmurs rubs or gallops Lungs: CTAB no crackles, wheeze, rhonchi Ext: no edema right arm. Left arm with edema and erythema as well as warmth up midway through forearm previously but now extended up to about 8 cm past the elbow- the erythema is less intense than previously. She remains tender with with wrist flexion and extension (but less so than last visit and has an additional 10 degrees each way of movement) as well as palpation in central portion of dorsal side of wrist Skin: warm, dry  Assessment/Plan:  Cellulitis S: Patient now with at least 3 days of erythema, warmth in her left arm. She was sent to orthopedics given pain with palpation of wrist and films were negative and not thought to be orthopedic issue per daughter- no records yet available. Daughter states patient  has been doing all normal activities and not complaining of pain unless area is pressed on (last visit she appeared to be in constant pain and was holding wrist up). She is compliant with doxycycline. Daughter believes area is less red bu tthen again the redness extends up past her elbow now. She has had no fevers, reported chills, or change in her normal confused mental state- no worsening. Level 5 caveat applies due to advanced dementia.  A/P: Concern on previous visit given wrist tenderness was fracture but this was ruled out by orthopedics. I was also concerned about cellulitis and sent patient home with doxycycline.  There are some portion of exams that seem improved (better wrist motion, less intense erythema) but the progression of redness past elbow is worrisome- fortunately no systemic signs of illness. Today, we added keflex to the doxycycline. Discussed with daughter if not improving by Saturday morning should take her to hospital for IV antibiotics. Also considered change to clindamycin.    emergent Return precautions advised such as fever  Meds ordered this encounter  Medications  . cephALEXin (KEFLEX) 500 MG capsule    Sig: Take 1 capsule (500 mg total) by mouth 4 (four) times daily.    Dispense:  28 capsule    Refill:  0   Garret Reddish, MD

## 2015-12-07 ENCOUNTER — Encounter: Payer: Self-pay | Admitting: Family Medicine

## 2016-02-18 ENCOUNTER — Encounter: Payer: Self-pay | Admitting: Family Medicine

## 2016-02-19 ENCOUNTER — Other Ambulatory Visit: Payer: Self-pay

## 2016-02-19 MED ORDER — ESCITALOPRAM OXALATE 20 MG PO TABS: 20.0000 mg | ORAL_TABLET | Freq: Every day | ORAL | 1 refills | Status: DC

## 2016-03-04 ENCOUNTER — Ambulatory Visit (INDEPENDENT_AMBULATORY_CARE_PROVIDER_SITE_OTHER): Payer: Medicare Other | Admitting: Family Medicine

## 2016-03-04 ENCOUNTER — Encounter: Payer: Self-pay | Admitting: Family Medicine

## 2016-03-04 VITALS — BP 136/70 | HR 60 | Temp 98.3°F | Wt 159.4 lb

## 2016-03-04 DIAGNOSIS — L989 Disorder of the skin and subcutaneous tissue, unspecified: Secondary | ICD-10-CM | POA: Diagnosis not present

## 2016-03-04 DIAGNOSIS — C44729 Squamous cell carcinoma of skin of left lower limb, including hip: Secondary | ICD-10-CM | POA: Diagnosis not present

## 2016-03-04 NOTE — Progress Notes (Signed)
Pre visit review using our clinic review tool, if applicable. No additional management support is needed unless otherwise documented below in the visit note. 

## 2016-03-04 NOTE — Patient Instructions (Signed)
We will call you within a week about your referral to dermatology- suspect this is skin cancer. If you do not hear within 2 weeks, give [place] a call.

## 2016-03-04 NOTE — Progress Notes (Signed)
Subjective:  Robin Ferguson is a 82 y.o. year old very pleasant female patient who presents for/with See problem oriented charting ROS- no fever, chills, nausea, vomiting. No expanding redness near skin lesions.see any ROS included in HPI as well.   Past Medical History-  Patient Active Problem List   Diagnosis Date Noted  . Do not resuscitate/ Do Not Intubate. Care Goals.  10/29/2014    Priority: High  . Dementia 04/28/2013    Priority: High  . Depression 10/29/2014    Priority: Medium  . Allergic rhinitis 10/29/2014    Priority: Medium  . Carotid stenosis 05/13/2013    Priority: Medium  . TIA (transient ischemic attack) 04/28/2013    Priority: Medium  . Hyperlipidemia 04/11/2007    Priority: Medium  . Hypothyroidism 01/16/2007    Priority: Medium  . Essential hypertension 01/16/2007    Priority: Medium  . GERD (gastroesophageal reflux disease) 10/29/2014    Priority: Low  . Osteopenia     Priority: Low  . Left bundle branch block 04/23/2007    Priority: Low  . SKIN CANCER, HX OF 01/16/2007    Priority: Low    Medications- reviewed and updated Current Outpatient Prescriptions  Medication Sig Dispense Refill  . calcium carbonate (TUMS) 500 MG chewable tablet Chew 1 tablet (200 mg of elemental calcium total) by mouth 3 (three) times daily. 30 tablet 0  . cephALEXin (KEFLEX) 500 MG capsule Take 1 capsule (500 mg total) by mouth 4 (four) times daily. 28 capsule 0  . doxycycline (VIBRA-TABS) 100 MG tablet Take 1 tablet (100 mg total) by mouth 2 (two) times daily. 14 tablet 0  . escitalopram (LEXAPRO) 20 MG tablet Take 1 tablet (20 mg total) by mouth daily. 90 tablet 1  . fluticasone (FLONASE) 50 MCG/ACT nasal spray Place 2 sprays into both nostrils daily. 16 g 3  . furosemide (LASIX) 20 MG tablet TAKE ONE TABLET BY MOUTH ONCE DAILY. 90 tablet 3  . levothyroxine (SYNTHROID, LEVOTHROID) 100 MCG tablet TAKE ONE TABLET BY MOUTH ONCE DAILY 90 tablet 3  . lisinopril  (PRINIVIL,ZESTRIL) 20 MG tablet TAKE ONE TABLET BY MOUTH ONCE DAILY 90 tablet 3  . loratadine (ALLERGY RELIEF) 10 MG tablet Take 10 mg by mouth daily as needed for allergies.     . metoprolol (LOPRESSOR) 50 MG tablet TAKE ONE-HALF TABLET BY MOUTH TWICE DAILY 90 tablet 3  . RABEprazole (ACIPHEX) 20 MG tablet TAKE ONE TABLET BY MOUTH ONCE DAILY 90 tablet 3   No current facility-administered medications for this visit.     Objective: BP 136/70   Pulse 60   Temp 98.3 F (36.8 C) (Oral)   Wt 159 lb 6.4 oz (72.3 kg)   SpO2 95%   BMI 30.12 kg/m  Gen: NAD, resting comfortably, pleasantly confused CV: RRR no murmurs rubs or gallops Lungs: CTAB no crackles, wheeze, rhonchi Ext: trace edema, approximately 3 x 3 cm erythematous papule on left shin with some hardened scale. On right leg there is a 1 cm papule but there is hardened scale above and below this lesion. Most of this was debrided revealing normal skin underneath but papule persisted. On left forearm has a less than 1 cm horned type lesion.  Assessment/Plan:  Skin lesion S: Patient has a history of multiple squamous cell skin cancers especially on her bilateral shins. She has not followed up with dermatologist Dr.  Tonia Brooms in sometime. Daughters have been hoping to avoid any surgeries because of patient takes a long  time to heal from them. They have noted a lesion on her left shin at least 2 months ago and it seems to have slowly grown. IT does have some white hardened areas on top of the large raised papule at least 3 cm in size. She has a similar smaller lesion on the right leg. The left leg has become painful over the last week or 2 but only over the skin lesion. They have been rubbing some oils into it which she is to seem to help but now this irritates her. A/P: I am concerned that skin lesion may represent a squamous cell carcinoma of the left shin. Tenderness could go along with erythema nodosum but there is only 1 lesion and favor  squamous cell skin carcinoma. She also needs to have the lesion on her right leg and left arm looked at. Wonder if these can be treated with cryotherapy but biopsy or excision may be required as well. We will get her plugged back in as a new provider within Dr. Marisue Ivan group  Orders Placed This Encounter  Procedures  . Ambulatory referral to Dermatology    Referral Priority:   Routine    Referral Type:   Consultation    Referral Reason:   Specialty Services Required    Requested Specialty:   Dermatology    Number of Visits Requested:   1   Return precautions advised.  Garret Reddish, MD

## 2016-03-09 DIAGNOSIS — T148 Other injury of unspecified body region: Secondary | ICD-10-CM | POA: Diagnosis not present

## 2016-03-09 DIAGNOSIS — D485 Neoplasm of uncertain behavior of skin: Secondary | ICD-10-CM | POA: Diagnosis not present

## 2016-03-11 DIAGNOSIS — C44729 Squamous cell carcinoma of skin of left lower limb, including hip: Secondary | ICD-10-CM | POA: Diagnosis not present

## 2016-03-11 DIAGNOSIS — L579 Skin changes due to chronic exposure to nonionizing radiation, unspecified: Secondary | ICD-10-CM | POA: Diagnosis not present

## 2016-04-07 DIAGNOSIS — C44729 Squamous cell carcinoma of skin of left lower limb, including hip: Secondary | ICD-10-CM | POA: Diagnosis not present

## 2016-04-15 DIAGNOSIS — C44319 Basal cell carcinoma of skin of other parts of face: Secondary | ICD-10-CM | POA: Diagnosis not present

## 2016-04-15 DIAGNOSIS — C44722 Squamous cell carcinoma of skin of right lower limb, including hip: Secondary | ICD-10-CM | POA: Diagnosis not present

## 2016-04-30 ENCOUNTER — Emergency Department (HOSPITAL_COMMUNITY): Payer: Medicare Other

## 2016-04-30 ENCOUNTER — Emergency Department (HOSPITAL_COMMUNITY)
Admission: EM | Admit: 2016-04-30 | Discharge: 2016-05-01 | Disposition: A | Discharge status: Home / Self Care | Payer: Medicare Other | Attending: Emergency Medicine | Admitting: Emergency Medicine

## 2016-04-30 ENCOUNTER — Encounter (HOSPITAL_COMMUNITY): Payer: Self-pay | Admitting: Emergency Medicine

## 2016-04-30 DIAGNOSIS — S199XXA Unspecified injury of neck, initial encounter: Secondary | ICD-10-CM | POA: Diagnosis not present

## 2016-04-30 DIAGNOSIS — M546 Pain in thoracic spine: Secondary | ICD-10-CM | POA: Diagnosis not present

## 2016-04-30 DIAGNOSIS — T148XXA Other injury of unspecified body region, initial encounter: Secondary | ICD-10-CM | POA: Diagnosis not present

## 2016-04-30 DIAGNOSIS — W1809XA Striking against other object with subsequent fall, initial encounter: Secondary | ICD-10-CM | POA: Insufficient documentation

## 2016-04-30 DIAGNOSIS — S3993XA Unspecified injury of pelvis, initial encounter: Secondary | ICD-10-CM | POA: Diagnosis not present

## 2016-04-30 DIAGNOSIS — Y929 Unspecified place or not applicable: Secondary | ICD-10-CM | POA: Insufficient documentation

## 2016-04-30 DIAGNOSIS — Z87891 Personal history of nicotine dependence: Secondary | ICD-10-CM | POA: Diagnosis not present

## 2016-04-30 DIAGNOSIS — Y999 Unspecified external cause status: Secondary | ICD-10-CM | POA: Insufficient documentation

## 2016-04-30 DIAGNOSIS — M545 Low back pain, unspecified: Secondary | ICD-10-CM

## 2016-04-30 DIAGNOSIS — E039 Hypothyroidism, unspecified: Secondary | ICD-10-CM | POA: Diagnosis not present

## 2016-04-30 DIAGNOSIS — W19XXXA Unspecified fall, initial encounter: Secondary | ICD-10-CM

## 2016-04-30 DIAGNOSIS — S0990XA Unspecified injury of head, initial encounter: Secondary | ICD-10-CM | POA: Insufficient documentation

## 2016-04-30 DIAGNOSIS — Z85828 Personal history of other malignant neoplasm of skin: Secondary | ICD-10-CM | POA: Insufficient documentation

## 2016-04-30 DIAGNOSIS — Y939 Activity, unspecified: Secondary | ICD-10-CM | POA: Insufficient documentation

## 2016-04-30 DIAGNOSIS — M25559 Pain in unspecified hip: Secondary | ICD-10-CM | POA: Diagnosis not present

## 2016-04-30 DIAGNOSIS — I10 Essential (primary) hypertension: Secondary | ICD-10-CM | POA: Diagnosis not present

## 2016-04-30 DIAGNOSIS — J45909 Unspecified asthma, uncomplicated: Secondary | ICD-10-CM | POA: Diagnosis not present

## 2016-04-30 DIAGNOSIS — S3992XA Unspecified injury of lower back, initial encounter: Secondary | ICD-10-CM | POA: Diagnosis present

## 2016-04-30 MED ORDER — ACETAMINOPHEN 325 MG PO TABS
650.0000 mg | ORAL_TABLET | Freq: Once | ORAL | Status: AC
  Administered 2016-04-30: 650 mg via ORAL
  Filled 2016-04-30: qty 2
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
## 2016-04-30 NOTE — ED Notes (Signed)
Bed: WA07 Expected date:  Expected time:  Means of arrival:  Comments: 82yo F back pain/fall

## 2016-05-01 ENCOUNTER — Emergency Department (HOSPITAL_COMMUNITY): Payer: Medicare Other

## 2016-05-01 DIAGNOSIS — M545 Low back pain: Secondary | ICD-10-CM | POA: Diagnosis not present

## 2016-05-01 DIAGNOSIS — S0990XA Unspecified injury of head, initial encounter: Secondary | ICD-10-CM | POA: Diagnosis not present

## 2016-05-01 DIAGNOSIS — S199XXA Unspecified injury of neck, initial encounter: Secondary | ICD-10-CM | POA: Diagnosis not present

## 2016-05-01 MED ORDER — IBUPROFEN 200 MG PO TABS
400.0000 mg | ORAL_TABLET | Freq: Once | ORAL | Status: AC
  Administered 2016-05-01: 400 mg via ORAL
  Filled 2016-05-01: qty 2
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
## 2016-05-01 NOTE — ED Notes (Signed)
Patient walked to bathroom without any difficulty. °
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
## 2016-05-04 ENCOUNTER — Telehealth: Payer: Self-pay | Admitting: Family Medicine

## 2016-05-04 ENCOUNTER — Ambulatory Visit (INDEPENDENT_AMBULATORY_CARE_PROVIDER_SITE_OTHER): Payer: Medicare Other | Admitting: Adult Health

## 2016-05-04 ENCOUNTER — Encounter: Payer: Self-pay | Admitting: Adult Health

## 2016-05-04 VITALS — BP 160/80 | Ht 62.0 in | Wt 155.6 lb

## 2016-05-04 DIAGNOSIS — R350 Frequency of micturition: Secondary | ICD-10-CM

## 2016-05-04 DIAGNOSIS — R8299 Other abnormal findings in urine: Secondary | ICD-10-CM | POA: Diagnosis not present

## 2016-05-04 LAB — POCT URINALYSIS DIPSTICK
BILIRUBIN UA: NEGATIVE
Blood, UA: NEGATIVE
GLUCOSE UA: NEGATIVE
KETONES UA: NEGATIVE
Nitrite, UA: NEGATIVE
Protein, UA: NEGATIVE
SPEC GRAV UA: 1.015
Urobilinogen, UA: NEGATIVE
pH, UA: 6

## 2016-05-04 MED ORDER — SULFAMETHOXAZOLE-TRIMETHOPRIM 800-160 MG PO TABS: 1.0000 | ORAL_TABLET | Freq: Two times a day (BID) | ORAL | 0 refills | Status: DC

## 2016-05-04 MED ORDER — SULFAMETHOXAZOLE-TRIMETHOPRIM 800-160 MG PO TABS
1.0000 | ORAL_TABLET | Freq: Two times a day (BID) | ORAL | 0 refills | Status: DC
Start: 2016-05-04 — End: 2016-05-09
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
## 2016-05-04 NOTE — Telephone Encounter (Signed)
Patient Name: Robin Ferguson DOB: 06-25-1923 Initial Comment Caller states mother does not look well and is pointing at her stomach. Nurse Assessment Nurse: Ronnald Ramp, RN, Miranda Date/Time (Eastern Time): 05/04/2016 12:42:32 PM Confirm and document reason for call. If symptomatic, describe symptoms. You must click the next button to save text entered. ---Caller states her mother is in memory care unit. She looks distressed and is pointing at her abdomen. Temp 99.1. Has the patient traveled out of the country within the last 30 days? ---Not Applicable Does the patient have any new or worsening symptoms? ---Yes Will a triage be completed? ---Yes Related visit to physician within the last 2 weeks? ---Yes Does the PT have any chronic conditions? (i.e. diabetes, asthma, etc.) ---Yes Is this a behavioral health or substance abuse call? ---No Guidelines Guideline Title Affirmed Question Affirmed Notes Weakness (Generalized) and Fatigue [1] MODERATE weakness (i.e., interferes with work, school, normal activities) AND [2] cause unknown (Exceptions: weakness with acute minor illness, or weakness from poor fluid intake) Final Disposition User See Physician within 4 Hours (or PCP triage) Ronnald Ramp, RN, Miranda Comments BP was 166/103, HR 73 Caller is no longer with the pt. She did say that once she was able to communicate to the pt that she understood she was needing to be seen and they gave her some Tylenol she did settle down. She is not sure of the answer to most of the triage questions. Appt scheduled for 5pm with Dorothyann Peng. She will contact the facility and have them check her VS again and check on her. She will call back if anything is worse or if her BP is still elevated. Referrals REFERRED TO PCP OFFICE Disagree/Comply: Comply

## 2016-05-05 ENCOUNTER — Inpatient Hospital Stay (HOSPITAL_COMMUNITY)
Admission: EM | Admit: 2016-05-05 | Discharge: 2016-05-09 | DRG: 481 | Disposition: A | Discharge status: Skilled Nursing Facility | Payer: Medicare Other | Attending: Internal Medicine | Admitting: Internal Medicine

## 2016-05-05 ENCOUNTER — Inpatient Hospital Stay (HOSPITAL_COMMUNITY): Payer: Medicare Other | Admitting: Anesthesiology

## 2016-05-05 ENCOUNTER — Inpatient Hospital Stay (HOSPITAL_COMMUNITY): Payer: Medicare Other

## 2016-05-05 ENCOUNTER — Encounter (HOSPITAL_COMMUNITY)
Admission: EM | Disposition: A | Discharge status: Skilled Nursing Facility | Payer: Self-pay | Source: Home / Self Care | Attending: Internal Medicine

## 2016-05-05 ENCOUNTER — Emergency Department (HOSPITAL_COMMUNITY): Payer: Medicare Other

## 2016-05-05 ENCOUNTER — Encounter (HOSPITAL_COMMUNITY): Payer: Self-pay

## 2016-05-05 DIAGNOSIS — Z7951 Long term (current) use of inhaled steroids: Secondary | ICD-10-CM | POA: Diagnosis not present

## 2016-05-05 DIAGNOSIS — Z9181 History of falling: Secondary | ICD-10-CM | POA: Diagnosis not present

## 2016-05-05 DIAGNOSIS — S52615A Nondisplaced fracture of left ulna styloid process, initial encounter for closed fracture: Secondary | ICD-10-CM | POA: Diagnosis present

## 2016-05-05 DIAGNOSIS — M79605 Pain in left leg: Secondary | ICD-10-CM | POA: Diagnosis not present

## 2016-05-05 DIAGNOSIS — S72145D Nondisplaced intertrochanteric fracture of left femur, subsequent encounter for closed fracture with routine healing: Secondary | ICD-10-CM | POA: Diagnosis not present

## 2016-05-05 DIAGNOSIS — M8580 Other specified disorders of bone density and structure, unspecified site: Secondary | ICD-10-CM | POA: Diagnosis present

## 2016-05-05 DIAGNOSIS — Z66 Do not resuscitate: Secondary | ICD-10-CM | POA: Diagnosis present

## 2016-05-05 DIAGNOSIS — S52612A Displaced fracture of left ulna styloid process, initial encounter for closed fracture: Secondary | ICD-10-CM | POA: Diagnosis present

## 2016-05-05 DIAGNOSIS — D72829 Elevated white blood cell count, unspecified: Secondary | ICD-10-CM | POA: Diagnosis present

## 2016-05-05 DIAGNOSIS — Z23 Encounter for immunization: Secondary | ICD-10-CM

## 2016-05-05 DIAGNOSIS — Z808 Family history of malignant neoplasm of other organs or systems: Secondary | ICD-10-CM | POA: Diagnosis not present

## 2016-05-05 DIAGNOSIS — I2510 Atherosclerotic heart disease of native coronary artery without angina pectoris: Secondary | ICD-10-CM | POA: Diagnosis present

## 2016-05-05 DIAGNOSIS — G309 Alzheimer's disease, unspecified: Secondary | ICD-10-CM | POA: Diagnosis present

## 2016-05-05 DIAGNOSIS — R29898 Other symptoms and signs involving the musculoskeletal system: Secondary | ICD-10-CM | POA: Diagnosis not present

## 2016-05-05 DIAGNOSIS — Y92122 Bedroom in nursing home as the place of occurrence of the external cause: Secondary | ICD-10-CM | POA: Diagnosis not present

## 2016-05-05 DIAGNOSIS — K219 Gastro-esophageal reflux disease without esophagitis: Secondary | ICD-10-CM | POA: Diagnosis present

## 2016-05-05 DIAGNOSIS — F329 Major depressive disorder, single episode, unspecified: Secondary | ICD-10-CM | POA: Diagnosis present

## 2016-05-05 DIAGNOSIS — S72142A Displaced intertrochanteric fracture of left femur, initial encounter for closed fracture: Principal | ICD-10-CM | POA: Diagnosis present

## 2016-05-05 DIAGNOSIS — S62616A Displaced fracture of proximal phalanx of right little finger, initial encounter for closed fracture: Secondary | ICD-10-CM | POA: Diagnosis not present

## 2016-05-05 DIAGNOSIS — F0390 Unspecified dementia without behavioral disturbance: Secondary | ICD-10-CM | POA: Diagnosis not present

## 2016-05-05 DIAGNOSIS — S199XXA Unspecified injury of neck, initial encounter: Secondary | ICD-10-CM | POA: Diagnosis not present

## 2016-05-05 DIAGNOSIS — Z8249 Family history of ischemic heart disease and other diseases of the circulatory system: Secondary | ICD-10-CM

## 2016-05-05 DIAGNOSIS — S62646A Nondisplaced fracture of proximal phalanx of right little finger, initial encounter for closed fracture: Secondary | ICD-10-CM | POA: Diagnosis present

## 2016-05-05 DIAGNOSIS — M7989 Other specified soft tissue disorders: Secondary | ICD-10-CM | POA: Diagnosis not present

## 2016-05-05 DIAGNOSIS — I10 Essential (primary) hypertension: Secondary | ICD-10-CM | POA: Diagnosis not present

## 2016-05-05 DIAGNOSIS — Z8673 Personal history of transient ischemic attack (TIA), and cerebral infarction without residual deficits: Secondary | ICD-10-CM | POA: Diagnosis not present

## 2016-05-05 DIAGNOSIS — W06XXXA Fall from bed, initial encounter: Secondary | ICD-10-CM | POA: Diagnosis present

## 2016-05-05 DIAGNOSIS — E039 Hypothyroidism, unspecified: Secondary | ICD-10-CM | POA: Diagnosis present

## 2016-05-05 DIAGNOSIS — S72002A Fracture of unspecified part of neck of left femur, initial encounter for closed fracture: Secondary | ICD-10-CM | POA: Diagnosis not present

## 2016-05-05 DIAGNOSIS — N390 Urinary tract infection, site not specified: Secondary | ICD-10-CM | POA: Diagnosis present

## 2016-05-05 DIAGNOSIS — F0280 Dementia in other diseases classified elsewhere without behavioral disturbance: Secondary | ICD-10-CM | POA: Diagnosis present

## 2016-05-05 DIAGNOSIS — W19XXXA Unspecified fall, initial encounter: Secondary | ICD-10-CM

## 2016-05-05 DIAGNOSIS — S62617A Displaced fracture of proximal phalanx of left little finger, initial encounter for closed fracture: Secondary | ICD-10-CM | POA: Diagnosis not present

## 2016-05-05 DIAGNOSIS — S72145A Nondisplaced intertrochanteric fracture of left femur, initial encounter for closed fracture: Secondary | ICD-10-CM | POA: Diagnosis present

## 2016-05-05 DIAGNOSIS — E785 Hyperlipidemia, unspecified: Secondary | ICD-10-CM | POA: Diagnosis present

## 2016-05-05 DIAGNOSIS — B9620 Unspecified Escherichia coli [E. coli] as the cause of diseases classified elsewhere: Secondary | ICD-10-CM

## 2016-05-05 DIAGNOSIS — Z833 Family history of diabetes mellitus: Secondary | ICD-10-CM | POA: Diagnosis not present

## 2016-05-05 DIAGNOSIS — Z87891 Personal history of nicotine dependence: Secondary | ICD-10-CM | POA: Diagnosis not present

## 2016-05-05 DIAGNOSIS — R262 Difficulty in walking, not elsewhere classified: Secondary | ICD-10-CM

## 2016-05-05 DIAGNOSIS — S0990XA Unspecified injury of head, initial encounter: Secondary | ICD-10-CM | POA: Diagnosis not present

## 2016-05-05 DIAGNOSIS — S62112A Displaced fracture of triquetrum [cuneiform] bone, left wrist, initial encounter for closed fracture: Secondary | ICD-10-CM | POA: Diagnosis not present

## 2016-05-05 DIAGNOSIS — M79644 Pain in right finger(s): Secondary | ICD-10-CM

## 2016-05-05 DIAGNOSIS — S72002D Fracture of unspecified part of neck of left femur, subsequent encounter for closed fracture with routine healing: Secondary | ICD-10-CM | POA: Diagnosis not present

## 2016-05-05 DIAGNOSIS — M6281 Muscle weakness (generalized): Secondary | ICD-10-CM | POA: Diagnosis not present

## 2016-05-05 DIAGNOSIS — S72142G Displaced intertrochanteric fracture of left femur, subsequent encounter for closed fracture with delayed healing: Secondary | ICD-10-CM

## 2016-05-05 DIAGNOSIS — M25552 Pain in left hip: Secondary | ICD-10-CM | POA: Diagnosis not present

## 2016-05-05 DIAGNOSIS — Z792 Long term (current) use of antibiotics: Secondary | ICD-10-CM | POA: Diagnosis not present

## 2016-05-05 DIAGNOSIS — R350 Frequency of micturition: Secondary | ICD-10-CM | POA: Diagnosis not present

## 2016-05-05 DIAGNOSIS — S79912A Unspecified injury of left hip, initial encounter: Secondary | ICD-10-CM | POA: Diagnosis not present

## 2016-05-05 DIAGNOSIS — Z419 Encounter for procedure for purposes other than remedying health state, unspecified: Secondary | ICD-10-CM

## 2016-05-05 DIAGNOSIS — T148XXA Other injury of unspecified body region, initial encounter: Secondary | ICD-10-CM | POA: Diagnosis not present

## 2016-05-05 DIAGNOSIS — R278 Other lack of coordination: Secondary | ICD-10-CM | POA: Diagnosis not present

## 2016-05-05 HISTORY — PX: FEMUR IM NAIL: SHX1597

## 2016-05-05 LAB — TYPE AND SCREEN
ABO/RH(D): O POS
ABO/RH(D): O POS
ANTIBODY SCREEN: NEGATIVE
Antibody Screen: NEGATIVE

## 2016-05-05 LAB — PROTIME-INR
INR: 1.04
PROTHROMBIN TIME: 13.7 s (ref 11.4–15.2)

## 2016-05-05 LAB — I-STAT CHEM 8, ED
BUN: 17 mg/dL (ref 6–20)
CREATININE: 0.8 mg/dL (ref 0.44–1.00)
Calcium, Ion: 1.07 mmol/L — ABNORMAL LOW (ref 1.15–1.40)
Chloride: 94 mmol/L — ABNORMAL LOW (ref 101–111)
Glucose, Bld: 120 mg/dL — ABNORMAL HIGH (ref 65–99)
HEMATOCRIT: 42 % (ref 36.0–46.0)
HEMOGLOBIN: 14.3 g/dL (ref 12.0–15.0)
POTASSIUM: 3.6 mmol/L (ref 3.5–5.1)
SODIUM: 132 mmol/L — AB (ref 135–145)
TCO2: 27 mmol/L (ref 0–100)

## 2016-05-05 LAB — CBC WITH DIFFERENTIAL/PLATELET
Basophils Absolute: 0 10*3/uL (ref 0.0–0.1)
Basophils Relative: 0 %
EOS PCT: 0 %
Eosinophils Absolute: 0 10*3/uL (ref 0.0–0.7)
HCT: 40 % (ref 36.0–46.0)
HEMOGLOBIN: 13.5 g/dL (ref 12.0–15.0)
LYMPHS ABS: 1.2 10*3/uL (ref 0.7–4.0)
LYMPHS PCT: 10 %
MCH: 29.5 pg (ref 26.0–34.0)
MCHC: 33.8 g/dL (ref 30.0–36.0)
MCV: 87.5 fL (ref 78.0–100.0)
MONOS PCT: 8 %
Monocytes Absolute: 0.9 10*3/uL (ref 0.1–1.0)
Neutro Abs: 9.6 10*3/uL — ABNORMAL HIGH (ref 1.7–7.7)
Neutrophils Relative %: 82 %
Platelets: 238 10*3/uL (ref 150–400)
RBC: 4.57 MIL/uL (ref 3.87–5.11)
RDW: 13.6 % (ref 11.5–15.5)
WBC: 11.8 10*3/uL — AB (ref 4.0–10.5)

## 2016-05-05 LAB — MRSA PCR SCREENING: MRSA BY PCR: NEGATIVE

## 2016-05-05 LAB — APTT: APTT: 31 s (ref 24–36)

## 2016-05-05 LAB — ABO/RH
ABO/RH(D): O POS
ABO/RH(D): O POS

## 2016-05-05 SURGERY — INSERTION, INTRAMEDULLARY ROD, FEMUR
Anesthesia: Spinal | Site: Hip | Laterality: Left

## 2016-05-05 MED ORDER — ACETAMINOPHEN 325 MG PO TABS: 650.0000 mg | ORAL_TABLET | Freq: Four times a day (QID) | ORAL | Status: DC | PRN

## 2016-05-05 MED ORDER — METOCLOPRAMIDE HCL 5 MG/ML IJ SOLN: 5.0000 mg | Freq: Three times a day (TID) | INTRAMUSCULAR | Status: DC | PRN

## 2016-05-05 MED ORDER — ROCURONIUM BROMIDE 100 MG/10ML IV SOLN
INTRAVENOUS | Status: DC | PRN
  Administered 2016-05-05: 40 mg via INTRAVENOUS

## 2016-05-05 MED ORDER — PANTOPRAZOLE SODIUM 40 MG PO TBEC
40.0000 mg | DELAYED_RELEASE_TABLET | Freq: Every day | ORAL | Status: DC
  Administered 2016-05-05 – 2016-05-09 (×5): 40 mg via ORAL
  Filled 2016-05-05 (×5): qty 1

## 2016-05-05 MED ORDER — PHENOL 1.4 % MT LIQD: 1.0000 | OROMUCOSAL | Status: DC | PRN

## 2016-05-05 MED ORDER — ONDANSETRON HCL 4 MG PO TABS
4.0000 mg | ORAL_TABLET | Freq: Four times a day (QID) | ORAL | Status: DC | PRN
Start: 2016-05-05 — End: 2016-05-09

## 2016-05-05 MED ORDER — PROPOFOL 10 MG/ML IV BOLUS
INTRAVENOUS | Status: DC | PRN
  Administered 2016-05-05: 20 mg via INTRAVENOUS
  Administered 2016-05-05: 100 mg via INTRAVENOUS

## 2016-05-05 MED ORDER — FENTANYL CITRATE (PF) 100 MCG/2ML IJ SOLN
25.0000 ug | Freq: Once | INTRAMUSCULAR | Status: AC
  Administered 2016-05-05: 25 ug via INTRAVENOUS
  Filled 2016-05-05: qty 2

## 2016-05-05 MED ORDER — CEFAZOLIN SODIUM-DEXTROSE 2-4 GM/100ML-% IV SOLN
2.0000 g | INTRAVENOUS | Status: AC
  Administered 2016-05-05: 2 g via INTRAVENOUS
  Filled 2016-05-05 (×2): qty 100

## 2016-05-05 MED ORDER — FENTANYL CITRATE (PF) 100 MCG/2ML IJ SOLN
INTRAMUSCULAR | Status: DC | PRN
  Administered 2016-05-05 (×4): 25 ug via INTRAVENOUS

## 2016-05-05 MED ORDER — LACTATED RINGERS IV SOLN
INTRAVENOUS | Status: DC | PRN
  Administered 2016-05-05: 13:00:00 via INTRAVENOUS

## 2016-05-05 MED ORDER — FENTANYL CITRATE (PF) 100 MCG/2ML IJ SOLN
INTRAMUSCULAR | Status: AC
  Filled 2016-05-05: qty 2

## 2016-05-05 MED ORDER — SODIUM CHLORIDE 0.9 % IV SOLN
INTRAVENOUS | Status: AC
  Administered 2016-05-05: 75 mL/h via INTRAVENOUS

## 2016-05-05 MED ORDER — METOCLOPRAMIDE HCL 5 MG PO TABS: 5.0000 mg | ORAL_TABLET | Freq: Three times a day (TID) | ORAL | Status: DC | PRN

## 2016-05-05 MED ORDER — FUROSEMIDE 20 MG PO TABS
20.0000 mg | ORAL_TABLET | Freq: Every day | ORAL | Status: DC
  Administered 2016-05-05 – 2016-05-09 (×3): 20 mg via ORAL
  Filled 2016-05-05 (×5): qty 1

## 2016-05-05 MED ORDER — MORPHINE SULFATE (PF) 2 MG/ML IV SOLN: 0.5000 mg | INTRAVENOUS | Status: DC | PRN

## 2016-05-05 MED ORDER — PHENYLEPHRINE 40 MCG/ML (10ML) SYRINGE FOR IV PUSH (FOR BLOOD PRESSURE SUPPORT)
PREFILLED_SYRINGE | INTRAVENOUS | Status: AC
  Filled 2016-05-05: qty 10

## 2016-05-05 MED ORDER — SUGAMMADEX SODIUM 200 MG/2ML IV SOLN
INTRAVENOUS | Status: AC
  Filled 2016-05-05: qty 2

## 2016-05-05 MED ORDER — 0.9 % SODIUM CHLORIDE (POUR BTL) OPTIME
TOPICAL | Status: DC | PRN
  Administered 2016-05-05: 1000 mL

## 2016-05-05 MED ORDER — LIDOCAINE HCL (CARDIAC) 20 MG/ML IV SOLN
INTRAVENOUS | Status: DC | PRN
  Administered 2016-05-05: 40 mg via INTRATRACHEAL

## 2016-05-05 MED ORDER — ACETAMINOPHEN 650 MG RE SUPP: 650.0000 mg | Freq: Four times a day (QID) | RECTAL | Status: DC | PRN

## 2016-05-05 MED ORDER — CHLORHEXIDINE GLUCONATE 4 % EX LIQD: 60.0000 mL | Freq: Once | CUTANEOUS | Status: DC

## 2016-05-05 MED ORDER — HYDROCODONE-ACETAMINOPHEN 5-325 MG PO TABS
1.0000 | ORAL_TABLET | Freq: Four times a day (QID) | ORAL | Status: DC | PRN
  Administered 2016-05-05 – 2016-05-09 (×6): 1 via ORAL
  Filled 2016-05-05 (×6): qty 1

## 2016-05-05 MED ORDER — MEPERIDINE HCL 25 MG/ML IJ SOLN: 6.2500 mg | INTRAMUSCULAR | Status: DC | PRN

## 2016-05-05 MED ORDER — ASPIRIN EC 325 MG PO TBEC
325.0000 mg | DELAYED_RELEASE_TABLET | Freq: Every day | ORAL | Status: DC
  Administered 2016-05-06 – 2016-05-09 (×4): 325 mg via ORAL
  Filled 2016-05-05 (×4): qty 1

## 2016-05-05 MED ORDER — PROPOFOL 10 MG/ML IV BOLUS
INTRAVENOUS | Status: AC
  Filled 2016-05-05: qty 20

## 2016-05-05 MED ORDER — ROCURONIUM BROMIDE 10 MG/ML (PF) SYRINGE
PREFILLED_SYRINGE | INTRAVENOUS | Status: AC
  Filled 2016-05-05: qty 10

## 2016-05-05 MED ORDER — ESCITALOPRAM OXALATE 10 MG PO TABS
20.0000 mg | ORAL_TABLET | Freq: Every day | ORAL | Status: DC
  Administered 2016-05-05 – 2016-05-09 (×5): 20 mg via ORAL
  Filled 2016-05-05 (×5): qty 2

## 2016-05-05 MED ORDER — ONDANSETRON HCL 4 MG/2ML IJ SOLN: 4.0000 mg | Freq: Once | INTRAMUSCULAR | Status: DC | PRN

## 2016-05-05 MED ORDER — TETANUS-DIPHTH-ACELL PERTUSSIS 5-2.5-18.5 LF-MCG/0.5 IM SUSP
0.5000 mL | Freq: Once | INTRAMUSCULAR | Status: AC
  Administered 2016-05-05: 0.5 mL via INTRAMUSCULAR
  Filled 2016-05-05: qty 0.5

## 2016-05-05 MED ORDER — LEVOTHYROXINE SODIUM 100 MCG PO TABS
100.0000 ug | ORAL_TABLET | Freq: Every day | ORAL | Status: DC
  Administered 2016-05-06 – 2016-05-09 (×4): 100 ug via ORAL
  Filled 2016-05-05 (×4): qty 1

## 2016-05-05 MED ORDER — FENTANYL CITRATE (PF) 100 MCG/2ML IJ SOLN
INTRAMUSCULAR | Status: AC
Start: 2016-05-05 — End: 2016-05-06
  Filled 2016-05-05: qty 2

## 2016-05-05 MED ORDER — FENTANYL CITRATE (PF) 100 MCG/2ML IJ SOLN: 25.0000 ug | Freq: Once | INTRAMUSCULAR | Status: DC

## 2016-05-05 MED ORDER — CEFAZOLIN SODIUM-DEXTROSE 2-4 GM/100ML-% IV SOLN
2.0000 g | Freq: Four times a day (QID) | INTRAVENOUS | Status: AC
  Administered 2016-05-05 (×2): 2 g via INTRAVENOUS
  Filled 2016-05-05 (×3): qty 100

## 2016-05-05 MED ORDER — SODIUM CHLORIDE 0.9 % IV SOLN
INTRAVENOUS | Status: DC | PRN
  Administered 2016-05-05: 11:00:00 via INTRAVENOUS

## 2016-05-05 MED ORDER — LISINOPRIL 20 MG PO TABS
20.0000 mg | ORAL_TABLET | Freq: Every day | ORAL | Status: DC
  Administered 2016-05-05 – 2016-05-09 (×3): 20 mg via ORAL
  Filled 2016-05-05 (×5): qty 1

## 2016-05-05 MED ORDER — FENTANYL CITRATE (PF) 100 MCG/2ML IJ SOLN
50.0000 ug | Freq: Once | INTRAMUSCULAR | Status: AC
  Administered 2016-05-05: 50 ug via INTRAVENOUS
  Filled 2016-05-05: qty 2

## 2016-05-05 MED ORDER — ONDANSETRON HCL 4 MG/2ML IJ SOLN
INTRAMUSCULAR | Status: DC | PRN
  Administered 2016-05-05: 4 mg via INTRAVENOUS

## 2016-05-05 MED ORDER — METOPROLOL TARTRATE 50 MG PO TABS
50.0000 mg | ORAL_TABLET | Freq: Two times a day (BID) | ORAL | Status: DC
  Administered 2016-05-05 – 2016-05-09 (×5): 50 mg via ORAL
  Filled 2016-05-05 (×8): qty 1

## 2016-05-05 MED ORDER — ONDANSETRON HCL 4 MG/2ML IJ SOLN: 4.0000 mg | Freq: Four times a day (QID) | INTRAMUSCULAR | Status: DC | PRN

## 2016-05-05 MED ORDER — BACITRACIN ZINC 500 UNIT/GM EX OINT
TOPICAL_OINTMENT | Freq: Two times a day (BID) | CUTANEOUS | Status: DC
  Administered 2016-05-05: 1 via TOPICAL
  Administered 2016-05-07 – 2016-05-09 (×4): 31.5556 via TOPICAL
  Filled 2016-05-05 (×2): qty 28.35
  Filled 2016-05-05: qty 0.9

## 2016-05-05 MED ORDER — SUGAMMADEX SODIUM 200 MG/2ML IV SOLN
INTRAVENOUS | Status: DC | PRN
  Administered 2016-05-05: 150 mg via INTRAVENOUS

## 2016-05-05 MED ORDER — POVIDONE-IODINE 10 % EX SWAB
2.0000 "application " | Freq: Once | CUTANEOUS | Status: AC
  Administered 2016-05-05: 2 via TOPICAL

## 2016-05-05 MED ORDER — FENTANYL CITRATE (PF) 100 MCG/2ML IJ SOLN
25.0000 ug | INTRAMUSCULAR | Status: DC | PRN
  Administered 2016-05-05 (×2): 25 ug via INTRAVENOUS
  Administered 2016-05-05: 50 ug via INTRAVENOUS

## 2016-05-05 MED ORDER — MENTHOL 3 MG MT LOZG: 1.0000 | LOZENGE | OROMUCOSAL | Status: DC | PRN

## 2016-05-05 MED ORDER — MIDAZOLAM HCL 2 MG/2ML IJ SOLN
INTRAMUSCULAR | Status: AC
  Filled 2016-05-05: qty 2

## 2016-05-05 MED ORDER — LIDOCAINE 2% (20 MG/ML) 5 ML SYRINGE
INTRAMUSCULAR | Status: AC
  Filled 2016-05-05: qty 5

## 2016-05-05 MED ORDER — PHENYLEPHRINE HCL 10 MG/ML IJ SOLN
INTRAMUSCULAR | Status: DC | PRN
  Administered 2016-05-05 (×2): 40 ug via INTRAVENOUS

## 2016-05-05 SURGICAL SUPPLY — 45 items
BANDAGE ELASTIC 3 VELCRO ST LF (GAUZE/BANDAGES/DRESSINGS) ×1 IMPLANT
BIT DRILL 4.3MMS DISTAL GRDTED (BIT) IMPLANT
BNDG COHESIVE 4X5 TAN STRL (GAUZE/BANDAGES/DRESSINGS) ×2 IMPLANT
BNDG GAUZE ELAST 4 BULKY (GAUZE/BANDAGES/DRESSINGS) ×2 IMPLANT
CHLORAPREP W/TINT 26ML (MISCELLANEOUS) ×2 IMPLANT
COVER PERINEAL POST (MISCELLANEOUS) ×2 IMPLANT
COVER SURGICAL LIGHT HANDLE (MISCELLANEOUS) ×2 IMPLANT
DRAPE INCISE IOBAN 66X45 STRL (DRAPES) ×2 IMPLANT
DRAPE STERI IOBAN 125X83 (DRAPES) ×2 IMPLANT
DRAPE SURG 17X23 STRL (DRAPES) ×8 IMPLANT
DRESSING ALLEVYN LIFE SACRUM (GAUZE/BANDAGES/DRESSINGS) ×1 IMPLANT
DRILL 4.3MMS DISTAL GRADUATED (BIT) ×2
DRSG EMULSION OIL 3X3 NADH (GAUZE/BANDAGES/DRESSINGS) ×1 IMPLANT
DRSG MEPILEX BORDER 4X4 (GAUZE/BANDAGES/DRESSINGS) ×3 IMPLANT
DRSG PAD ABDOMINAL 8X10 ST (GAUZE/BANDAGES/DRESSINGS) ×2 IMPLANT
DRSG TEGADERM 4X4.75 (GAUZE/BANDAGES/DRESSINGS) ×1 IMPLANT
ELECT REM PT RETURN 9FT ADLT (ELECTROSURGICAL) ×2
ELECTRODE REM PT RTRN 9FT ADLT (ELECTROSURGICAL) ×1 IMPLANT
GAUZE XEROFORM 5X9 LF (GAUZE/BANDAGES/DRESSINGS) ×2 IMPLANT
GLOVE BIO SURGEON STRL SZ7 (GLOVE) ×2 IMPLANT
GLOVE BIO SURGEON STRL SZ7.5 (GLOVE) ×2 IMPLANT
GLOVE BIOGEL PI IND STRL 8 (GLOVE) ×1 IMPLANT
GLOVE BIOGEL PI INDICATOR 8 (GLOVE) ×1
GOWN STRL REUS W/ TWL LRG LVL3 (GOWN DISPOSABLE) ×2 IMPLANT
GOWN STRL REUS W/TWL LRG LVL3 (GOWN DISPOSABLE) ×4
GUIDEPIN 3.2X17.5 THRD DISP (PIN) ×2 IMPLANT
HFN LH 130 DEG 11MM X 340MM (Nail) ×1 IMPLANT
KIT ROOM TURNOVER OR (KITS) ×2 IMPLANT
LINER BOOT UNIVERSAL DISP (MISCELLANEOUS) ×2 IMPLANT
MANIFOLD NEPTUNE II (INSTRUMENTS) ×1 IMPLANT
NS IRRIG 1000ML POUR BTL (IV SOLUTION) ×2 IMPLANT
PACK GENERAL/GYN (CUSTOM PROCEDURE TRAY) ×2 IMPLANT
PAD ARMBOARD 7.5X6 YLW CONV (MISCELLANEOUS) ×4 IMPLANT
PAD CAST 3X4 CTTN HI CHSV (CAST SUPPLIES) IMPLANT
PADDING CAST COTTON 3X4 STRL (CAST SUPPLIES) ×2
SCREW LAG HIP NAIL 10.5X95 (Screw) ×1 IMPLANT
SPLINT FIBERGLASS 3X12 (CAST SUPPLIES) ×1 IMPLANT
STAPLER VISISTAT 35W (STAPLE) ×3 IMPLANT
SUT VIC AB 1 CT1 27 (SUTURE) ×2
SUT VIC AB 1 CT1 27XBRD ANBCTR (SUTURE) ×1 IMPLANT
SUT VIC AB 2-0 CT1 27 (SUTURE) ×2
SUT VIC AB 2-0 CT1 TAPERPNT 27 (SUTURE) ×1 IMPLANT
TOWEL OR 17X24 6PK STRL BLUE (TOWEL DISPOSABLE) ×2 IMPLANT
TOWEL OR 17X26 10 PK STRL BLUE (TOWEL DISPOSABLE) ×2 IMPLANT
WATER STERILE IRR 1000ML POUR (IV SOLUTION) ×1 IMPLANT

## 2016-05-05 NOTE — ED Notes (Signed)
Patient transported to X-ray 

## 2016-05-05 NOTE — Consult Note (Signed)
Reason for Consult: L hip fracture  Referring Physician: Loree Lomibao is an 82 y.o. female.  HPI: 82 yo in a memory care fell out of bed late last night. Concern of left hip pain, unable to ambulate. Also with new concern of left wrist pain since being in the hospital. She has fairly significant dementia but generally gets around fairly well with a walker.  Past Medical History:  Diagnosis Date  . Allergy    rhinitis  . Asthma   . Cancer ([place])    skin- right shoulder-Squamous cell-legs  . Diverticulosis   . DIVERTICULOSIS OF COLON 01/11/2008   Qualifier: Diagnosis of  By: [name], [name]   . Esophageal stricture   . Hemorrhoids   . Hx of adenomatous colonic polyps   . Hyperlipidemia   . Hypertension   . Hypothyroidism   . Osteopenia   . Personal history of colonic polyps 01/11/2008   Qualifier: Diagnosis of  By: [name], [name]   . PMB (postmenopausal bleeding)   . Stroke ([place])   . Tachycardia     Past Surgical History:  Procedure Laterality Date  . CATARACT EXTRACTION    . DILATION AND CURETTAGE OF UTERUS    . HYSTEROSCOPY    . NASAL SINUS SURGERY    . SHOULDER SURGERY    . TUBAL LIGATION  1959    Family History  Problem Relation Age of Onset  . Cancer Mother     bone  . Heart disease Brother   . Diabetes Brother   . Heart disease Brother   . Diabetes Brother     Social History:  reports that she has quit smoking. She has never used smokeless tobacco. She reports that she does not drink alcohol or use drugs.  Allergies:  Allergies  Allergen Reactions  . Procaine Hcl     REACTION: heart palpitation    Medications: I have reviewed the patient's current medications.  Results for orders placed or performed during the hospital encounter of 05/05/16 (from the past 48 hour(s))  CBC with Differential/Platelet     Status: Abnormal   Collection Time: 05/05/16  4:02 AM  Result Value Ref Range   WBC 11.8 (H) 4.0 - 10.5 K/uL   RBC 4.57 3.87 -  5.11 MIL/uL   Hemoglobin 13.5 12.0 - 15.0 g/dL   HCT 40.0 36.0 - 46.0 %   MCV 87.5 78.0 - 100.0 fL   MCH 29.5 26.0 - 34.0 pg   MCHC 33.8 30.0 - 36.0 g/dL   RDW 13.6 11.5 - 15.5 %   Platelets 238 150 - 400 K/uL   Neutrophils Relative % 82 %   Neutro Abs 9.6 (H) 1.7 - 7.7 K/uL   Lymphocytes Relative 10 %   Lymphs Abs 1.2 0.7 - 4.0 K/uL   Monocytes Relative 8 %   Monocytes Absolute 0.9 0.1 - 1.0 K/uL   Eosinophils Relative 0 %   Eosinophils Absolute 0.0 0.0 - 0.7 K/uL   Basophils Relative 0 %   Basophils Absolute 0.0 0.0 - 0.1 K/uL  Protime-INR     Status: None   Collection Time: 05/05/16  4:02 AM  Result Value Ref Range   Prothrombin Time 13.7 11.4 - 15.2 seconds   INR 1.04   APTT     Status: None   Collection Time: 05/05/16  4:02 AM  Result Value Ref Range   aPTT 31 24 - 36 seconds  I-Stat Chem 8, ED  Status: Abnormal   Collection Time: 05/05/16  4:12 AM  Result Value Ref Range   Sodium 132 (L) 135 - 145 mmol/L   Potassium 3.6 3.5 - 5.1 mmol/L   Chloride 94 (L) 101 - 111 mmol/L   BUN 17 6 - 20 mg/dL   Creatinine, Ser 0.80 0.44 - 1.00 mg/dL   Glucose, Bld 120 (H) 65 - 99 mg/dL   Calcium, Ion 1.07 (L) 1.15 - 1.40 mmol/L   TCO2 27 0 - 100 mmol/L   Hemoglobin 14.3 12.0 - 15.0 g/dL   HCT 42.0 36.0 - 46.0 %  Type and screen [place]     Status: None   Collection Time: 05/05/16  6:00 AM  Result Value Ref Range   ABO/RH(D) O POS    Antibody Screen NEG    Sample Expiration 05/08/2016   ABO/Rh     Status: None   Collection Time: 05/05/16  6:08 AM  Result Value Ref Range   ABO/RH(D) O POS     Dg Chest 1 View  Result Date: 05/05/2016 CLINICAL DATA:  Left hip fracture EXAM: CHEST 1 VIEW COMPARISON:  04/28/2013 FINDINGS: Shallow inspiration. No focal airspace consolidation. No large effusion. No pneumothorax. Pulmonary vasculature is normal. IMPRESSION: No active disease. Electronically Signed   By: Andreas Newport M.D.   On: 05/05/2016 04:02   Ct  Head Wo Contrast  Result Date: 05/05/2016 CLINICAL DATA:  Unwitnessed fall, found on floor at 00:10 EXAM: CT HEAD WITHOUT CONTRAST CT CERVICAL SPINE WITHOUT CONTRAST TECHNIQUE: Multidetector CT imaging of the head and cervical spine was performed following the standard protocol without intravenous contrast. Multiplanar CT image reconstructions of the cervical spine were also generated. COMPARISON:  05/01/2016 FINDINGS: CT HEAD FINDINGS Brain: No evidence of acute infarction, hemorrhage, hydrocephalus, extra-axial collection or mass lesion/mass effect. There is moderate generalized atrophy. There is hemispheric white matter hypodensity consistent with chronic small vessel ischemic disease. Vascular: No hyperdense vessel or unexpected calcification. Skull: Normal. Negative for fracture or focal lesion. Sinuses/Orbits: No acute finding. CT CERVICAL SPINE FINDINGS The vertebral column, pedicles and facet articulations are intact. There is no evidence of acute fracture. No acute soft tissue abnormalities are evident. Moderate degenerative disc and facet changes are present in the midcervical spine. No bone lesion or bony destruction. IMPRESSION: 1. No acute intracranial findings. There is moderate generalized atrophy and chronic appearing white matter hypodensities which likely represent small vessel ischemic disease. 2. Negative for acute cervical spine fracture. Electronically Signed   By: Andreas Newport M.D.   On: 05/05/2016 04:06   Ct Cervical Spine Wo Contrast  Result Date: 05/05/2016 CLINICAL DATA:  Unwitnessed fall, found on floor at 00:10 EXAM: CT HEAD WITHOUT CONTRAST CT CERVICAL SPINE WITHOUT CONTRAST TECHNIQUE: Multidetector CT imaging of the head and cervical spine was performed following the standard protocol without intravenous contrast. Multiplanar CT image reconstructions of the cervical spine were also generated. COMPARISON:  05/01/2016 FINDINGS: CT HEAD FINDINGS Brain: No evidence of acute  infarction, hemorrhage, hydrocephalus, extra-axial collection or mass lesion/mass effect. There is moderate generalized atrophy. There is hemispheric white matter hypodensity consistent with chronic small vessel ischemic disease. Vascular: No hyperdense vessel or unexpected calcification. Skull: Normal. Negative for fracture or focal lesion. Sinuses/Orbits: No acute finding. CT CERVICAL SPINE FINDINGS The vertebral column, pedicles and facet articulations are intact. There is no evidence of acute fracture. No acute soft tissue abnormalities are evident. Moderate degenerative disc and facet changes are present in the midcervical spine.  No bone lesion or bony destruction. IMPRESSION: 1. No acute intracranial findings. There is moderate generalized atrophy and chronic appearing white matter hypodensities which likely represent small vessel ischemic disease. 2. Negative for acute cervical spine fracture. Electronically Signed   By: Andreas Newport M.D.   On: 05/05/2016 04:06   Dg Hip Unilat W Or Wo Pelvis 2-3 Views Left  Result Date: 05/05/2016 CLINICAL DATA:  [place] this morning. EXAM: DG HIP (WITH OR WITHOUT PELVIS) 2-3V LEFT COMPARISON:  None. FINDINGS: There is an intertrochanteric left hip fracture with mild foreshortening and moderate varus angulation. No dislocation. No radiographic findings to suggest a pathologic basis for the fracture. IMPRESSION: Intertrochanteric left hip fracture Electronically Signed   By: Andreas Newport M.D.   On: 05/05/2016 04:03    Review of Systems  Unable to perform ROS: Dementia   Blood pressure 161/79, pulse 73, temperature 99.2 F (37.3 C), temperature source Oral, resp. rate 21, SpO2 92 %. Physical Exam  Constitutional: She appears well-developed.  HENT:  Head: Atraumatic.  Respiratory: Effort normal.  Musculoskeletal:  L hip swollen TTP, distally grossly NVI.  L wrist swollen and TTP.  Neurological: She is alert.  Skin: Skin is warm and dry.  Psychiatric:  She has a normal mood and affect.    Assessment/Plan: Left intertrochanteric hip fracture Concern for possible injury to the left wrist Plan left hip trochanteric nail to promote early ambulation, pain control and anatomic alignment. We will x-ray the left wrist in the operating room. Risks benefits and alternatives were discussed at length with the family. Consent on chart  NPO for OR Preop antibiotics    Stephanny Tsutsui WILLIAM 05/05/2016, 11:14 AM

## 2016-05-05 NOTE — Anesthesia Procedure Notes (Signed)
Date/Time: 05/05/2016 11:57 AM Performed by: Jenne Campus Pre-anesthesia Checklist: Patient identified, Emergency Drugs available, Suction available and Patient being monitored Oxygen Delivery Method: Circle system utilized Preoxygenation: Pre-oxygenation with 100% oxygen Intubation Type: IV induction Ventilation: Mask ventilation without difficulty Laryngoscope Size: Miller and 2 Grade View: Grade I Tube type: Oral Tube size: 7.0 mm Number of attempts: 1 Airway Equipment and Method: Stylet

## 2016-05-05 NOTE — Discharge Instructions (Signed)
Discharge Instructions after Hip Surgery ° ° °You can bear weight and ambulate as tolerated on both legs °Use ice on the hip intermittently over the first 48 hours after surgery.  °Pain medicine has been prescribed for you.  °Use your medicine liberally over the first 48 hours, and then you can begin to taper your use. You may take Extra Strength Tylenol or Tylenol only in place of the pain pills. DO NOT take ANY nonsteroidal anti-inflammatory pain medications: Advil, Motrin, Ibuprofen, Aleve, Naproxen or Naprosyn.  °Take aspirin or anticoagulant medication as prescribed for 2 weeks after surgery. Please notify if allergic or sensitivity to aspirin. °You may remove your dressing after two days.  °You may shower 5 days after surgery. The incisions CANNOT get wet prior to 5 days. Simply allow the water to wash over the site and then pat dry. Do not rub the incisions. ° ° ° °Please call [phone] during normal business hours or [phone] after hours for any problems. Including the following: ° °- excessive redness of the incisions °- drainage for more than 4 days °- fever of more than 101.5 F ° °*Please note that pain medications will not be refilled after hours or on weekends. ° °

## 2016-05-05 NOTE — Anesthesia Procedure Notes (Signed)
Procedure Name: Intubation Date/Time: 05/05/2016 11:58 AM Performed by: Jenne Campus Pre-anesthesia Checklist: Patient identified, Emergency Drugs available, Suction available and Patient being monitored Patient Re-evaluated:Patient Re-evaluated prior to inductionOxygen Delivery Method: Circle System Utilized Preoxygenation: Pre-oxygenation with 100% oxygen Intubation Type: IV induction Ventilation: Mask ventilation without difficulty Laryngoscope Size: Miller and 2 Grade View: Grade II Tube type: Oral Tube size: 7.0 mm Number of attempts: 1 Airway Equipment and Method: Stylet and Oral airway Placement Confirmation: ETT inserted through vocal cords under direct vision,  positive ETCO2 and breath sounds checked- equal and bilateral Secured at: 22 cm Tube secured with: Tape Dental Injury: Teeth and Oropharynx as per pre-operative assessment
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
## 2016-05-05 NOTE — Op Note (Signed)
Procedure(s):   Robin Ferguson female 82 y.o. 05/05/2016  Procedure(s) and Anesthesia Type:    * LEFT TROCHANTERIC (IM) NAIL FEMORAL - General  Surgeon(s) and Role:    * Tania Ade, MD - Primary   Indications:  82 y.o. female s/p fall with left hip fracture. Indicated for surgery to promote early ambulation, pain control and prevent complications of bed rest.     Surgeon: Nita Sells   Assistants: Jeanmarie Hubert PA-C (Danielle was present and scrubbed throughout the procedure and was essential in positioning, retraction, exposure, and closure)  Anesthesia: General endotracheal anesthesia    Procedure Detail    Findings: Biomet affixes nail size 11 x 140 mm. Anatomic reduction. No distal interlocking screw was used with stable fracture pattern.  Estimated Blood Loss:  less than 100 mL         Drains: none  Blood Given: none          Specimens: none        Complications:  * No complications entered in OR log *         Disposition: PACU - hemodynamically stable.         Condition: stable    Procedure:  The patient was identified in the preoperative holding area  where I personally marked the operative site after verifying site, side,  and procedure with the patient. She was taken back to the operating  room where general anesthesia was induced without complication. She was  placed on the fracture table with the left lower extremity in traction,  and opposite lower extremity in a flexed abducted position. The arms were well  padded. Fluoroscopic imaging was used to verify reduction with gentle traction  and internal rotation. The left hip was then prepped and draped in the standard sterile fashion. An approximately 3 cm incision was made proximal  to the palpable greater trochanter tip. Dissection was carried down to  the tip and the short guidewire was placed under fluoroscopic imaging.  The proximal entry reamer was used to open the canal  and the 11 x 140 mm nail was then  advanced without difficulty and the proximal jig was  then placed and a small 1.5 cm incision was made on the lateral thigh to  advance the lag screw guide against the lateral aspect of the femur.  The guide pin was advanced and its position was verified in AP and  lateral planes to be centered in the head.  The guidewire was over  reamed and the appropriate size lag screw was advanced. The  proximal set screw was then advanced, backed off a quarter turn to allow  sliding. AP and lateral imaging demonstrated appropriate position of  the screw and reduction of the fracture. The proximal jig was then  removed. Final fluoroscopic imaging in AP and lateral planes at the hip and the  knee demonstrated near anatomic reduction with appropriate length and  position of the hardware. All wounds were then copiously irrigated with  normal saline and subsequently closed in layers with #1 Vicryl in a deep  fascia layer, 2-0 Vicryl in a deep dermal layer, and staples for skin  closure. Sterile dressings were then applied including 4x4s and Mepilex  dressings. The patient was then taken off the fracture table,  transferred to the stretcher, and taken to the recovery room in stable  condition after she was extubated.   POSTOPERATIVE PLAN: She will be weightbearing as tolerated on the operative extremity. She will  have DVT prophylaxis of aspirin and SCDs

## 2016-05-05 NOTE — ED Notes (Signed)
Pt daughter has arrived at pt bedside and states that pt was being treated for a UTI and was diagnosed today at MD appointment

## 2016-05-05 NOTE — Anesthesia Postprocedure Evaluation (Signed)
Anesthesia Post Note  Patient: Robin Ferguson  Procedure(s) Performed: Procedure(s) (LRB): INTRAMEDULLARY (IM) NAIL FEMORAL (Left)  Patient location during evaluation: PACU Anesthesia Type: General Level of consciousness: awake and alert Pain management: pain level controlled Vital Signs Assessment: post-procedure vital signs reviewed and stable Respiratory status: spontaneous breathing, nonlabored ventilation and respiratory function stable Cardiovascular status: stable and blood pressure returned to baseline Postop Assessment: no signs of nausea or vomiting Anesthetic complications: no    Last Vitals:  Vitals:   05/05/16 1440 05/05/16 1517  BP: (!) 147/69 140/64  Pulse: 76 77  Resp: 16 16  Temp: 36.3 C 37.1 C    Last Pain:  Vitals:   05/05/16 1440  TempSrc:   PainSc: Asleep                 Antaniya Venuti A.

## 2016-05-05 NOTE — ED Triage Notes (Signed)
PT was brought in via EMS who stated that pt was c/o left leg pain. Pt is from memory care unit at [facility], in where facility staff stated that the responded to the pt yelling, Per EMS pt had a unwitness fall. Pt sustained skin tears, on left knee, and left  Forearm.

## 2016-05-05 NOTE — ED Notes (Signed)
[facility] called for transport. 

## 2016-05-05 NOTE — Progress Notes (Signed)
This is a no charge note   Penicillin admission per Dr. Randal Buba  82 year old lady with past medical history of hypertension, hyperlipidemia, asthma, stroke, hypothyroidism, depression, allergy, who presents with unwitnessed fall in SNF. Found to have left hip Fx. CT head and C-spine is negative for acute abnormalities. CBC 7.8, sodium 132, creatinine normal. Temperature 99.2, no tachycardia, and saturation normal on room air. Pt is accepted to med-surg bed as inpt. Ortho will be consulted by EDP. Pt is likely to be transferred to [facility].  Ivor Costa, MD  [facility] Pager [phone]  If 7PM-7AM, please contact night-coverage [email or web address] Password [email or web address] 05/05/2016, 5:30 AM\

## 2016-05-05 NOTE — ED Notes (Signed)
ED Provider at bedside. 

## 2016-05-05 NOTE — Anesthesia Preprocedure Evaluation (Addendum)
Anesthesia Evaluation  Patient identified by MRN, date of birth, ID band Patient awake    Reviewed: Allergy & Precautions, NPO status , Patient's Chart, lab work & pertinent test results, reviewed documented beta blocker date and time   Airway Mallampati: III  TM Distance: >3 FB Neck ROM: Limited    Dental  (+) Poor Dentition, Dental Advisory Given   Pulmonary asthma , former smoker,    Pulmonary exam normal breath sounds clear to auscultation       Cardiovascular hypertension, Pt. on medications and Pt. on home beta blockers + Peripheral Vascular Disease  Normal cardiovascular exam+ dysrhythmias  Rhythm:Regular Rate:Normal     Neuro/Psych PSYCHIATRIC DISORDERS Depression TIACVA, Residual Symptoms    GI/Hepatic   Endo/Other    Renal/GU      Musculoskeletal   Abdominal Normal abdominal exam  (+)   Peds  Hematology   Anesthesia Other Findings   Reproductive/Obstetrics                              Chemistry      Component Value Date/Time   NA 132 (L) 05/05/2016 0412   K 3.6 05/05/2016 0412   CL 94 (L) 05/05/2016 0412   CO2 29 03/07/2015 2020   BUN 17 05/05/2016 0412   CREATININE 0.80 05/05/2016 0412      Component Value Date/Time   CALCIUM 8.9 03/07/2015 2020   ALKPHOS 82 03/07/2015 2020   AST 21 03/07/2015 2020   ALT 12 (L) 03/07/2015 2020   BILITOT 0.5 03/07/2015 2020     Lab Results  Component Value Date   WBC 11.8 (H) 05/05/2016   HGB 14.3 05/05/2016   HCT 42.0 05/05/2016   MCV 87.5 05/05/2016   PLT 238 05/05/2016   EKG: normal sinus rhythm, LBBB, LVH by voltage criteria.  Echo 04/2013:  Left ventricle: Wall thickness was increased in a pattern of mild LVH. Systolic function was normal. The estimated ejection fraction was in the range of 60% to 65%. Doppler parameters are consistent with abnormal left ventricular relaxation (grade 1 diastolic dysfunction).  Doppler parameters are consistent with high ventricular filling pressure. - Mitral valve: Severely calcified annulus. - Atrial septum: There was increased thickness of the septum, consistent with lipomatous hypertrophy. - Pulmonary arteries: PA peak pressure: 62mm Hg (S). Impressions:  - No cardiac source of embolism was identified, but cannot be ruled out on the basis of this examination.  Anesthesia Physical Anesthesia Plan  ASA: III  Anesthesia Plan: Spinal   Post-op Pain Management:    Induction:   Airway Management Planned: Natural Airway and Nasal Cannula  Additional Equipment:   Intra-op Plan:   Post-operative Plan:   Informed Consent: I have reviewed the patients History and Physical, chart, labs and discussed the procedure including the risks, benefits and alternatives for the proposed anesthesia with the patient or authorized representative who has indicated his/her understanding and acceptance.   Dental advisory given  Plan Discussed with: Anesthesiologist, CRNA and Surgeon  Anesthesia Plan Comments:         Anesthesia Quick Evaluation

## 2016-05-05 NOTE — ED Provider Notes (Signed)
[facility] DEPT Provider Note   CSN: [id number] Arrival date & time: 05/05/16  0044 By signing my name below, I, Doran Stabler, attest that this documentation has been prepared under the direction and in the presence of Sharlot Sturkey, MD. Electronically Signed: Doran Stabler, ED Scribe. 05/05/16. 3:05 AM.  History   Chief Complaint Chief Complaint  Patient presents with  . Fall   The history is provided by a relative, the EMS personnel and medical records. The history is limited by the condition of the patient (dementia). No language interpreter was used.  Fall  This is a new problem. The current episode started 3 to 5 hours ago. The problem occurs constantly. The problem has not changed since onset.Pertinent negatives include no chest pain and no abdominal pain. Nothing aggravates the symptoms. Nothing relieves the symptoms. She has tried nothing for the symptoms. The treatment provided no relief.   HPI Comments: Robin Ferguson is a 82 y.o. female who presents to the Emergency Department complaining of sudden left leg pain s/p unwitnessed fall that occured 3 hours ago. Daughter reports the pt was found on the floor by nursing home staff at 12:10 AM. Daughter also reports associated left arm skin tears.  Daughter denies any other symptoms at this time. Pt is unable to provide any history due to dementia.   Past Medical History:  Diagnosis Date  . Allergy    rhinitis  . Asthma   . Cancer ([place])    skin- right shoulder-Squamous cell-legs  . Diverticulosis   . DIVERTICULOSIS OF COLON 01/11/2008   Qualifier: Diagnosis of  By: [name], [name]   . Esophageal stricture   . Hemorrhoids   . Hx of adenomatous colonic polyps   . Hyperlipidemia   . Hypertension   . Hypothyroidism   . Osteopenia   . Personal history of colonic polyps 01/11/2008   Qualifier: Diagnosis of  By: [name], [name]   . PMB (postmenopausal bleeding)   . Stroke ([place])   . Tachycardia     Patient  Active Problem List   Diagnosis Date Noted  . Depression 10/29/2014  . Allergic rhinitis 10/29/2014  . GERD (gastroesophageal reflux disease) 10/29/2014  . Do not resuscitate/ Do Not Intubate. Care Goals.  10/29/2014  . Carotid stenosis 05/13/2013  . TIA (transient ischemic attack) 04/28/2013  . Dementia 04/28/2013  . Osteopenia   . Left bundle branch block 04/23/2007  . Hyperlipidemia 04/11/2007  . Hypothyroidism 01/16/2007  . Essential hypertension 01/16/2007  . SKIN CANCER, HX OF 01/16/2007    Past Surgical History:  Procedure Laterality Date  . CATARACT EXTRACTION    . DILATION AND CURETTAGE OF UTERUS    . HYSTEROSCOPY    . NASAL SINUS SURGERY    . SHOULDER SURGERY    . TUBAL LIGATION  1959    OB History    Gravida Para Term Preterm AB Living   4 4 4     4    SAB TAB Ectopic Multiple Live Births                 Home Medications    Prior to Admission medications   Medication Sig Start Date End Date Taking? Authorizing Provider  calcium carbonate (TUMS) 500 MG chewable tablet Chew 1 tablet (200 mg of elemental calcium total) by mouth 3 (three) times daily. 04/29/13   Kelvin Cellar, MD  cephALEXin (KEFLEX) 500 MG capsule Take 1 capsule (500 mg total) by mouth 4 (four) times daily.  12/03/15   Marin Olp, MD  doxycycline (VIBRA-TABS) 100 MG tablet Take 1 tablet (100 mg total) by mouth 2 (two) times daily. 12/01/15   Marin Olp, MD  escitalopram (LEXAPRO) 20 MG tablet Take 1 tablet (20 mg total) by mouth daily. 02/19/16   Marin Olp, MD  fluticasone (FLONASE) 50 MCG/ACT nasal spray Place 2 sprays into both nostrils daily. 07/20/15   Marin Olp, MD  furosemide (LASIX) 20 MG tablet TAKE ONE TABLET BY MOUTH ONCE DAILY. 07/21/15   Marin Olp, MD  levothyroxine (SYNTHROID, LEVOTHROID) 100 MCG tablet TAKE ONE TABLET BY MOUTH ONCE DAILY 07/20/15   Marin Olp, MD  lisinopril (PRINIVIL,ZESTRIL) 20 MG tablet TAKE ONE TABLET BY MOUTH ONCE DAILY 07/20/15    Marin Olp, MD  loratadine (ALLERGY RELIEF) 10 MG tablet Take 10 mg by mouth daily as needed for allergies.     Historical Provider, MD  metoprolol (LOPRESSOR) 50 MG tablet TAKE ONE-HALF TABLET BY MOUTH TWICE DAILY 07/20/15   Marin Olp, MD  RABEprazole (ACIPHEX) 20 MG tablet TAKE ONE TABLET BY MOUTH ONCE DAILY 07/20/15   Marin Olp, MD  sulfamethoxazole-trimethoprim (BACTRIM DS,SEPTRA DS) 800-160 MG tablet Take 1 tablet by mouth 2 (two) times daily. 05/04/16   Dorothyann Peng, NP    Family History Family History  Problem Relation Age of Onset  . Cancer Mother     bone  . Heart disease Brother   . Diabetes Brother   . Heart disease Brother   . Diabetes Brother     Social History Social History  Substance Use Topics  . Smoking status: Former Research scientist (life sciences)  . Smokeless tobacco: Never Used  . Alcohol use No     Comment: rare   Allergies   Procaine hcl   Review of Systems Review of Systems  Unable to perform ROS: Dementia  Constitutional: Negative for fever.  Cardiovascular: Negative for chest pain.  Gastrointestinal: Negative for abdominal pain.  Musculoskeletal: Positive for arthralgias.   Physical Exam Updated Vital Signs BP 175/85 (BP Location: Left Arm)   Pulse 73   Temp 99.2 F (37.3 C) (Oral)   Resp 18   SpO2 99%   Physical Exam  Constitutional: She is oriented to person, place, and time. She appears well-developed and well-nourished. No distress.  HENT:  Head: Normocephalic and atraumatic. Head is without raccoon's eyes and without Battle's sign.  Right Ear: External ear normal. No hemotympanum.  Left Ear: External ear normal. No hemotympanum.  Mouth/Throat: Oropharynx is clear and moist. No oropharyngeal exudate.  Moist mucous membranes   Eyes: Conjunctivae are normal. Pupils are equal, round, and reactive to light.  Neck: Normal range of motion. Neck supple. No JVD present.  Trachea midline No bruit  Cardiovascular: Normal rate, regular rhythm,  normal heart sounds and intact distal pulses.  Exam reveals no friction rub.   Pulmonary/Chest: Effort normal and breath sounds normal. No stridor. No respiratory distress.  Abdominal: Soft. She exhibits no distension and no mass. There is no tenderness.  Hyperactive bowel sounds  Musculoskeletal:       Left shoulder: Normal.       Left elbow: Normal.       Right wrist: Normal.       Left wrist: Normal.       Left hip: She exhibits decreased range of motion, tenderness, bony tenderness and deformity.       Right knee: Normal.       Left  knee: Normal.       Right ankle: Normal. Achilles tendon normal.       Left ankle: Normal. Achilles tendon normal.  Negative anterior posterior drawer bilateral knees. Left leg externally rotated.   Neurological: She is alert and oriented to person, place, and time. She has normal reflexes.  Skin: Skin is warm and dry.  Scab left lateral elbow. Denuded area to the left lateral distal arm that is  1.25cm x 1 cm ovoid in nature.   Psychiatric: She has a normal mood and affect. Her behavior is normal.  Nursing note and vitals reviewed.  ED Treatments / Results   Vitals:   05/05/16 0225 05/05/16 0422  BP: 175/85 178/80  Pulse: 73 74  Resp: 18 18  Temp:      DIAGNOSTIC STUDIES: Oxygen Saturation is 99% on room air, normal by my interpretation.   Results for orders placed or performed during the hospital encounter of 05/05/16  CBC with Differential/Platelet  Result Value Ref Range   WBC 11.8 (H) 4.0 - 10.5 K/uL   RBC 4.57 3.87 - 5.11 MIL/uL   Hemoglobin 13.5 12.0 - 15.0 g/dL   HCT 40.0 36.0 - 46.0 %   MCV 87.5 78.0 - 100.0 fL   MCH 29.5 26.0 - 34.0 pg   MCHC 33.8 30.0 - 36.0 g/dL   RDW 13.6 11.5 - 15.5 %   Platelets 238 150 - 400 K/uL   Neutrophils Relative % 82 %   Neutro Abs 9.6 (H) 1.7 - 7.7 K/uL   Lymphocytes Relative 10 %   Lymphs Abs 1.2 0.7 - 4.0 K/uL   Monocytes Relative 8 %   Monocytes Absolute 0.9 0.1 - 1.0 K/uL   Eosinophils  Relative 0 %   Eosinophils Absolute 0.0 0.0 - 0.7 K/uL   Basophils Relative 0 %   Basophils Absolute 0.0 0.0 - 0.1 K/uL  I-Stat Chem 8, ED  Result Value Ref Range   Sodium 132 (L) 135 - 145 mmol/L   Potassium 3.6 3.5 - 5.1 mmol/L   Chloride 94 (L) 101 - 111 mmol/L   BUN 17 6 - 20 mg/dL   Creatinine, Ser 0.80 0.44 - 1.00 mg/dL   Glucose, Bld 120 (H) 65 - 99 mg/dL   Calcium, Ion 1.07 (L) 1.15 - 1.40 mmol/L   TCO2 27 0 - 100 mmol/L   Hemoglobin 14.3 12.0 - 15.0 g/dL   HCT 42.0 36.0 - 46.0 %   Dg Chest 1 View  Result Date: 05/05/2016 CLINICAL DATA:  Left hip fracture EXAM: CHEST 1 VIEW COMPARISON:  04/28/2013 FINDINGS: Shallow inspiration. No focal airspace consolidation. No large effusion. No pneumothorax. Pulmonary vasculature is normal. IMPRESSION: No active disease. Electronically Signed   By: Andreas Newport M.D.   On: 05/05/2016 04:02   Dg Lumbar Spine Complete  Result Date: 04/30/2016 CLINICAL DATA:  Low back pain, fall EXAM: LUMBAR SPINE - COMPLETE 4+ VIEW COMPARISON:  02/10/2014 FINDINGS: SI joint arthritic changes. Rightward scoliosis of the lumbar spine. Lumbar numbering of the vertebra will be similar to the 02/10/2014 exam for consistency. Five non-rib-bearing lumbar vertebra. Stable 1 cm anterior listhesis of L4 on L5. There is 7 mm anterior listhesis of L3 on L4. Mild to moderate compression of L2 vertebral body with approximate 25% loss of height, progressed in the interim. Moderate compression deformity of T9 with close to 50% loss of height. Advanced degenerative changes at L2-L3, L3-L4, L4-L5 and L5-S1. Hypertrophic facet disease from L3 through S1. Atherosclerosis  of the aorta. Calcified pelvic phleboliths. Marked degenerative changes of the right hip. IMPRESSION: 1. Bones are osteopenic. 2. Stable borderline grade 2 anterior listhesis of L4 on L5. Stable grade 1 anterior listhesis of L3 on L4. 3. Mild to moderate compression of L2, slightly progressed since [id number] but  of uncertain chronicity. Moderate compression of T9, uncertain chronicity. 4. Marked multilevel degenerative changes. 5. Atherosclerosis of the aorta. Electronically Signed   By: Donavan Foil M.D.   On: 04/30/2016 23:22   Dg Pelvis 1-2 Views  Result Date: 04/30/2016 CLINICAL DATA:  Fall with pain EXAM: PELVIS - 1-2 VIEW COMPARISON:  None. FINDINGS: Single view pelvis demonstrates bilateral SI joint arthritic changes. Bones are osteopenic. No acute fracture or dislocation. Moderate degenerative changes of the left hip. Severe degenerative changes of the right hip with narrowing, subchondral sclerosis, and osteophyte. Pubic symphysis appears intact.  Calcified pelvic phleboliths. IMPRESSION: 1. No gross fracture or dislocation 2. Moderate severe right greater than left arthritic changes of the bilateral hips. Electronically Signed   By: Donavan Foil M.D.   On: 04/30/2016 23:23   Ct Head Wo Contrast  Result Date: 05/05/2016 CLINICAL DATA:  Unwitnessed fall, found on floor at 00:10 EXAM: CT HEAD WITHOUT CONTRAST CT CERVICAL SPINE WITHOUT CONTRAST TECHNIQUE: Multidetector CT imaging of the head and cervical spine was performed following the standard protocol without intravenous contrast. Multiplanar CT image reconstructions of the cervical spine were also generated. COMPARISON:  05/01/2016 FINDINGS: CT HEAD FINDINGS Brain: No evidence of acute infarction, hemorrhage, hydrocephalus, extra-axial collection or mass lesion/mass effect. There is moderate generalized atrophy. There is hemispheric white matter hypodensity consistent with chronic small vessel ischemic disease. Vascular: No hyperdense vessel or unexpected calcification. Skull: Normal. Negative for fracture or focal lesion. Sinuses/Orbits: No acute finding. CT CERVICAL SPINE FINDINGS The vertebral column, pedicles and facet articulations are intact. There is no evidence of acute fracture. No acute soft tissue abnormalities are evident. Moderate  degenerative disc and facet changes are present in the midcervical spine. No bone lesion or bony destruction. IMPRESSION: 1. No acute intracranial findings. There is moderate generalized atrophy and chronic appearing white matter hypodensities which likely represent small vessel ischemic disease. 2. Negative for acute cervical spine fracture. Electronically Signed   By: Andreas Newport M.D.   On: 05/05/2016 04:06   Ct Head Wo Contrast  Result Date: 05/01/2016 CLINICAL DATA:  82 year old female with fall and trauma to the head. EXAM: CT HEAD WITHOUT CONTRAST CT CERVICAL SPINE WITHOUT CONTRAST TECHNIQUE: Multidetector CT imaging of the head and cervical spine was performed following the standard protocol without intravenous contrast. Multiplanar CT image reconstructions of the cervical spine were also generated. COMPARISON:  Head CT dated 04/28/2013 and brain MRI dated 04/29/2013. FINDINGS: CT HEAD FINDINGS Brain: There is moderate age-related atrophy and chronic microvascular ischemic changes. No acute intracranial hemorrhage. No mass effect or midline shift noted. No extra-axial fluid collection. Vascular: No hyperdense vessel or unexpected calcification. Skull: Normal. Negative for fracture or focal lesion. Sinuses/Orbits: Chronic appearing mucoperiosteal thickening of right maxillary sinus. No air-fluid levels. The remainder of the visualized paranasal sinuses and mastoid air cells are clear. Bilateral cataract surgeries. Other: An 11 x 13 mm fat containing lesion with partial coarse calcification in the right parietal scalp was present on the prior CT and MRI. CT CERVICAL SPINE FINDINGS Alignment: No acute subluxation. There is straightening of the normal cervical lordosis which may be positional or due to muscle spasm. Skull base and vertebrae: No acute  fracture. No primary bone lesion or focal pathologic process. Evaluation for fracture however is somewhat limited due to osteopenia and degenerative  changes. Soft tissues and spinal canal: Bilateral carotid bulb atherosclerotic plaques. The soft tissues appear unremarkable. No intracranial hematoma. Disc levels: There are multilevel disc disease with disc space narrowing and endplate irregularity. Multilevel facet joint desiccation with vacuum phenomenon noted. There is grade 1 C7-T1 anterolisthesis. Upper chest: Negative. Other: None IMPRESSION: No acute intracranial hemorrhage. No acute/traumatic cervical spine pathology. Electronically Signed   By: Anner Crete M.D.   On: 05/01/2016 01:31   Ct Cervical Spine Wo Contrast  Result Date: 05/05/2016 CLINICAL DATA:  Unwitnessed fall, found on floor at 00:10 EXAM: CT HEAD WITHOUT CONTRAST CT CERVICAL SPINE WITHOUT CONTRAST TECHNIQUE: Multidetector CT imaging of the head and cervical spine was performed following the standard protocol without intravenous contrast. Multiplanar CT image reconstructions of the cervical spine were also generated. COMPARISON:  05/01/2016 FINDINGS: CT HEAD FINDINGS Brain: No evidence of acute infarction, hemorrhage, hydrocephalus, extra-axial collection or mass lesion/mass effect. There is moderate generalized atrophy. There is hemispheric white matter hypodensity consistent with chronic small vessel ischemic disease. Vascular: No hyperdense vessel or unexpected calcification. Skull: Normal. Negative for fracture or focal lesion. Sinuses/Orbits: No acute finding. CT CERVICAL SPINE FINDINGS The vertebral column, pedicles and facet articulations are intact. There is no evidence of acute fracture. No acute soft tissue abnormalities are evident. Moderate degenerative disc and facet changes are present in the midcervical spine. No bone lesion or bony destruction. IMPRESSION: 1. No acute intracranial findings. There is moderate generalized atrophy and chronic appearing white matter hypodensities which likely represent small vessel ischemic disease. 2. Negative for acute cervical spine  fracture. Electronically Signed   By: Andreas Newport M.D.   On: 05/05/2016 04:06   Ct Cervical Spine Wo Contrast  Result Date: 05/01/2016 CLINICAL DATA:  82 year old female with fall and trauma to the head. EXAM: CT HEAD WITHOUT CONTRAST CT CERVICAL SPINE WITHOUT CONTRAST TECHNIQUE: Multidetector CT imaging of the head and cervical spine was performed following the standard protocol without intravenous contrast. Multiplanar CT image reconstructions of the cervical spine were also generated. COMPARISON:  Head CT dated 04/28/2013 and brain MRI dated 04/29/2013. FINDINGS: CT HEAD FINDINGS Brain: There is moderate age-related atrophy and chronic microvascular ischemic changes. No acute intracranial hemorrhage. No mass effect or midline shift noted. No extra-axial fluid collection. Vascular: No hyperdense vessel or unexpected calcification. Skull: Normal. Negative for fracture or focal lesion. Sinuses/Orbits: Chronic appearing mucoperiosteal thickening of right maxillary sinus. No air-fluid levels. The remainder of the visualized paranasal sinuses and mastoid air cells are clear. Bilateral cataract surgeries. Other: An 11 x 13 mm fat containing lesion with partial coarse calcification in the right parietal scalp was present on the prior CT and MRI. CT CERVICAL SPINE FINDINGS Alignment: No acute subluxation. There is straightening of the normal cervical lordosis which may be positional or due to muscle spasm. Skull base and vertebrae: No acute fracture. No primary bone lesion or focal pathologic process. Evaluation for fracture however is somewhat limited due to osteopenia and degenerative changes. Soft tissues and spinal canal: Bilateral carotid bulb atherosclerotic plaques. The soft tissues appear unremarkable. No intracranial hematoma. Disc levels: There are multilevel disc disease with disc space narrowing and endplate irregularity. Multilevel facet joint desiccation with vacuum phenomenon noted. There is  grade 1 C7-T1 anterolisthesis. Upper chest: Negative. Other: None IMPRESSION: No acute intracranial hemorrhage. No acute/traumatic cervical spine pathology. Electronically Signed  By: Anner Crete M.D.   On: 05/01/2016 01:31   Dg Hip Unilat W Or Wo Pelvis 2-3 Views Left  Result Date: 05/05/2016 CLINICAL DATA:  [place] this morning. EXAM: DG HIP (WITH OR WITHOUT PELVIS) 2-3V LEFT COMPARISON:  None. FINDINGS: There is an intertrochanteric left hip fracture with mild foreshortening and moderate varus angulation. No dislocation. No radiographic findings to suggest a pathologic basis for the fracture. IMPRESSION: Intertrochanteric left hip fracture Electronically Signed   By: Andreas Newport M.D.   On: 05/05/2016 04:03    COORDINATION OF CARE: 3:05 AM Discussed treatment plan with pt at bedside and pt agreed to plan.  Procedures Procedures (including critical care time)  Medications Ordered in ED Medications  bacitracin ointment (1 application Topical Given 05/05/16 0420)  0.9 %  sodium chloride infusion (not administered)  fentaNYL (SUBLIMAZE) injection 50 mcg (not administered)  Tdap (BOOSTRIX) injection 0.5 mL (0.5 mLs Intramuscular Given 05/05/16 0408)     NPO   Final Clinical Impressions(s) / ED Diagnoses  Skin tears and left hip fracture.   Per Dr. Tamera Punt admit to hospitalists at [facility]  Per Dr. Blaine Hamper admit to inpatient med surg under Dr. Hal Hope   I personally performed the services described in this documentation, which was scribed in my presence. The recorded information has been reviewed and is accurate.      Veatrice Kells, MD 05/05/16 [phone]

## 2016-05-05 NOTE — Transfer of Care (Signed)
Immediate Anesthesia Transfer of Care Note  Patient: Robin Ferguson  Procedure(s) Performed: Procedure(s): INTRAMEDULLARY (IM) NAIL FEMORAL (Left)  Patient Location: PACU  Anesthesia Type:General  Level of Consciousness: awake and patient cooperative  Airway & Oxygen Therapy: Patient Spontanous Breathing and Patient connected to face mask oxygen  Post-op Assessment: Report given to RN and Post -op Vital signs reviewed and stable  Post vital signs: Reviewed  Last Vitals:  Vitals:   05/05/16 0800 05/05/16 1325  BP: 161/79 130/90  Pulse: 73 78  Resp: 21 20  Temp:  36.4 C    Last Pain:  Vitals:   05/05/16 0051  TempSrc: Oral         Complications: No apparent anesthesia complications

## 2016-05-06 ENCOUNTER — Encounter (HOSPITAL_COMMUNITY): Payer: Self-pay | Admitting: Orthopedic Surgery

## 2016-05-06 ENCOUNTER — Inpatient Hospital Stay (HOSPITAL_COMMUNITY): Payer: Medicare Other

## 2016-05-06 DIAGNOSIS — S62646A Nondisplaced fracture of proximal phalanx of right little finger, initial encounter for closed fracture: Secondary | ICD-10-CM

## 2016-05-06 DIAGNOSIS — E039 Hypothyroidism, unspecified: Secondary | ICD-10-CM

## 2016-05-06 DIAGNOSIS — F0390 Unspecified dementia without behavioral disturbance: Secondary | ICD-10-CM

## 2016-05-06 DIAGNOSIS — N390 Urinary tract infection, site not specified: Secondary | ICD-10-CM

## 2016-05-06 DIAGNOSIS — S72145D Nondisplaced intertrochanteric fracture of left femur, subsequent encounter for closed fracture with routine healing: Secondary | ICD-10-CM

## 2016-05-06 DIAGNOSIS — I10 Essential (primary) hypertension: Secondary | ICD-10-CM

## 2016-05-06 DIAGNOSIS — S62102D Fracture of unspecified carpal bone, left wrist, subsequent encounter for fracture with routine healing: Secondary | ICD-10-CM

## 2016-05-06 LAB — CBC
HEMATOCRIT: 35.3 % — AB (ref 36.0–46.0)
HEMOGLOBIN: 11.6 g/dL — AB (ref 12.0–15.0)
MCH: 28.8 pg (ref 26.0–34.0)
MCHC: 32.9 g/dL (ref 30.0–36.0)
MCV: 87.6 fL (ref 78.0–100.0)
Platelets: 211 10*3/uL (ref 150–400)
RBC: 4.03 MIL/uL (ref 3.87–5.11)
RDW: 13.5 % (ref 11.5–15.5)
WBC: 9.6 10*3/uL (ref 4.0–10.5)

## 2016-05-06 LAB — BASIC METABOLIC PANEL
ANION GAP: 8 (ref 5–15)
BUN: 11 mg/dL (ref 6–20)
CALCIUM: 7.9 mg/dL — AB (ref 8.9–10.3)
CHLORIDE: 97 mmol/L — AB (ref 101–111)
CO2: 24 mmol/L (ref 22–32)
CREATININE: 0.78 mg/dL (ref 0.44–1.00)
GFR calc Af Amer: >60 mL/min (ref >60)
GFR calc non Af Amer: >60 mL/min (ref >60)
GLUCOSE: 99 mg/dL (ref 65–99)
Potassium: 3.8 mmol/L (ref 3.5–5.1)
Sodium: 129 mmol/L — ABNORMAL LOW (ref 135–145)

## 2016-05-06 MED ORDER — MORPHINE SULFATE (PF) 2 MG/ML IV SOLN: 0.5000 mg | INTRAVENOUS | Status: DC | PRN

## 2016-05-06 MED ORDER — HALOPERIDOL LACTATE 5 MG/ML IJ SOLN
0.5000 mg | Freq: Two times a day (BID) | INTRAMUSCULAR | Status: DC | PRN
  Administered 2016-05-06 – 2016-05-09 (×3): 0.5 mg via INTRAVENOUS
  Filled 2016-05-06 (×3): qty 1

## 2016-05-06 MED ORDER — DEXTROSE 5 % IV SOLN
1.0000 g | INTRAVENOUS | Status: DC
  Administered 2016-05-06 – 2016-05-08 (×3): 1 g via INTRAVENOUS
  Filled 2016-05-06 (×3): qty 10

## 2016-05-06 NOTE — Progress Notes (Signed)
   05/06/16 1245  What Happened  Was fall witnessed? No  Was patient injured? Yes  Patient found on floor  Found by Staff-comment  Stated prior activity other (comment) (bed)  Follow Up  MD notified Dyann Kief  Time MD notified 1300  Family notified Yes-comment  Time family notified 1300  Additional tests Yes-comment  Simple treatment Other (comment) (xray performed of right hand)  Progress note created (see row info) Yes  Adult Fall Risk Assessment  Risk Factor Category (scoring not indicated) History of more than one fall within 6 months before admission (document High fall risk)  Age 82  Fall History: Fall within 6 months prior to admission 5  Elimination; Bowel and/or Urine Incontinence 2  Elimination; Bowel and/or Urine Urgency/Frequency 2  Medications: includes PCA/Opiates, Anti-convulsants, Anti-hypertensives, Diuretics, Hypnotics, Laxatives, Sedatives, and Psychotropics 3  Patient Care Equipment 1  Mobility-Assistance 2  Mobility-Gait 2  Mobility-Sensory Deficit 0  Altered awareness of immediate physical environment 1  Impulsiveness 2  Lack of understanding of one's physical/cognitive limitations 4  Total Score 27  Patient's Fall Risk High Fall Risk (>13 points)  Adult Fall Risk Interventions  Required Bundle Interventions *See Row Information* High fall risk - low, moderate, and high requirements implemented  Additional Interventions Camera surveillance (with patient/family notification & education);Individualized elimination schedule;Use of appropriate toileting equipment (bedpan, BSC, etc.);Reorient/diversional activities with confused patients  Screening for Fall Injury Risk  Risk For Fall Injury- See Row Information  A;B;D;F;Nurse judgement  Injury Prevention Interventions Camera surveillance (with patient/family notification & education)  Pain Assessment  Pain Assessment Faces  Faces Pain Scale 2  Pain Type Acute pain  Pain Location Finger (Comment which  one) (right 5th digit)  Pain Orientation Right  Pain Descriptors / Indicators Aching  Pain Frequency Intermittent  Pain Onset Sudden  Pain Intervention(s) Other (Comment) (MD made aware)  PCA/Epidural/Spinal Assessment  Respiratory Pattern Regular;Unlabored  Neurological  Neuro (WDL) X  Level of Consciousness Alert  Orientation Level Oriented to person;Disoriented to place;Disoriented to time;Disoriented to situation  Cognition Memory impairment  Speech Clear;Expressive aphasia  Musculoskeletal  Musculoskeletal (WDL) X  Assistive Device Sara Lift  Generalized Weakness Yes  Weight Bearing Restrictions Yes  LLE Weight Bearing WBAT  Musculoskeletal Details  RUE Limited movement;Deformity;Injury/trauma  LLE Limited movement;Swelling;Surgery  Integumentary  Integumentary (WDL) X  Skin Integrity Abrasion  Abrasion Location Back  Abrasion Location Orientation Mid  Abrasion Intervention Cleansed  Skin Tear Location Arm;Leg (previous )  Skin Tear Location Orientation Right;Left  Skin Tear Intervention Gauze;Thin film  Skin Turgor Tenting   Xray positive for fracture, Dr Dyann Kief and Dr Tamera Punt aware, Dr Tamera Punt placed buddy splint on 4th and 5th digit of right hand, bed alarm placed, sitter order placed per Dr Dyann Kief
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
## 2016-05-06 NOTE — Clinical Social Work Note (Signed)
Clinical Social Work Assessment  Patient Details  Name: Robin Ferguson MRN: [id number] Date of Birth: 1922/07/22  Date of referral:  05/06/16               Reason for consult:  Facility Placement                Permission sought to share information with:   (Facilities) Permission granted to share information::   (Facilities)  Name::        Agency::     Relationship::     Contact Information:     Housing/Transportation Living arrangements for the past 2 months:  [employer]) Source of Information:  Adult Children (Daughter) Patient Interpreter Needed:  None Criminal Activity/Legal Involvement Pertinent to Current Situation/Hospitalization:  No - Comment as needed Significant Relationships:  Adult Children Lives with:  Facility Resident Do you feel safe going back to the place where you live?   (Daughter is interested in SNFs) Need for family participation in patient care:  Yes (Comment)  Care giving concerns:  SW spoke with daughter at bedside. Daughter confirms that pt is from [facility]. Daughter states that she would like for patient to go to SNF. SW also consulted with [facility] to inquire if they provided rehab. SW spoke with nurse at facility who states that they can provide therapy on an individual basis and could take the pt back if needed. SW made daughter aware. However, daughter states that she would prefer pt go a SNF. Daughter is agreeable for pt to be referred out to SNFs in the community. SW will refer pt out.    Social Worker assessment / plan:  SW will refer the pt to SNF. SW attempted to speak with pt. However, she has a hx of dementia. Patient was able to say a few things but information was taken from daughter.   Employment status:  Retired Forensic scientist:  Medicare PT Recommendations:  [place] / Referral to community resources:   (SNF)  Patient/Family's Response to care:  Appropriate.    Patient/Family's Understanding of and Emotional Response to Diagnosis, Current Treatment, and Prognosis:  No questions for SW at this time.   Emotional Assessment Appearance:  Appears stated age Attitude/Demeanor/Rapport:   (Appropriate. Patient did not speak much, she let her daughter give information. ) Affect (typically observed):  Accepting Orientation:  Oriented to Self, Oriented to Situation, Oriented to Place Alcohol / Substance use:  Not Applicable Psych involvement (Current and /or in the community):  No (Comment)  Discharge Needs  Concerns to be addressed:  No discharge needs identified Readmission within the last 30 days:  No Current discharge risk:  None Barriers to Discharge:  No Barriers Identified   Robin Ferguson, Robin Ferguson R 05/06/2016, 11:12 AM

## 2016-05-06 NOTE — NC FL2 (Signed)
[place] MEDICAID FL2 LEVEL OF CARE SCREENING TOOL     IDENTIFICATION  Patient Name: Robin Ferguson Birthdate: [birth year] Sex: female Admission Date (Current Location): 05/05/2016  [facility] and [place] Number:  Herbalist and Address:  [facility], [place] [address] [place], [place] [address]      Provider Number: [id number]  Attending Physician Name and Address:  Barton Dubois, MD  Relative Name and Phone Number:       Current Level of Care: Hospital Recommended Level of Care: [place] Prior Approval Number:    Date Approved/Denied:   PASRR Number:    Discharge Plan: SNF    Current Diagnoses: Patient Active Problem List   Diagnosis Date Noted  . Intertrochanteric fracture of left hip ([place]) 05/05/2016  . Depression 10/29/2014  . Allergic rhinitis 10/29/2014  . GERD (gastroesophageal reflux disease) 10/29/2014  . Do not resuscitate/ Do Not Intubate. Care Goals.  10/29/2014  . Carotid stenosis 05/13/2013  . TIA (transient ischemic attack) 04/28/2013  . Senile dementia uncomp, without behavioral disturbance 04/28/2013  . Osteopenia   . Left bundle branch block 04/23/2007  . Hyperlipidemia 04/11/2007  . Hypothyroidism 01/16/2007  . Essential hypertension 01/16/2007  . SKIN CANCER, HX OF 01/16/2007    Orientation RESPIRATION BLADDER Height & Weight      (Patient has a hx of dementia.)  Normal Incontinent Weight:   Height:     BEHAVIORAL SYMPTOMS/MOOD NEUROLOGICAL BOWEL NUTRITION STATUS      Incontinent  (Normal)  AMBULATORY STATUS COMMUNICATION OF NEEDS Skin   Extensive Assist Verbally Normal                       Personal Care Assistance Level of Assistance  Bathing, Dressing Bathing Assistance: Maximum assistance   Dressing Assistance: Maximum assistance     Functional Limitations Info             SPECIAL CARE FACTORS FREQUENCY                       Contractures      Additional  Factors Info                  Current Medications (05/06/2016):  This is the current hospital active medication list Current Facility-Administered Medications  Medication Dose Route Frequency Provider Last Rate Last Dose  . acetaminophen (TYLENOL) tablet 650 mg  650 mg Oral Q6H PRN Grier Mitts, PA-C       Or  . acetaminophen (TYLENOL) suppository 650 mg  650 mg Rectal Q6H PRN Grier Mitts, PA-C      . aspirin EC tablet 325 mg  325 mg Oral Q breakfast Grier Mitts, PA-C   325 mg at 05/06/16 0927  . bacitracin ointment   Topical BID April Palumbo, MD   1 application at [id number]  . escitalopram (LEXAPRO) tablet 20 mg  20 mg Oral Daily Grier Mitts, PA-C   20 mg at 05/06/16 [id number]  . furosemide (LASIX) tablet 20 mg  20 mg Oral Daily Grier Mitts, PA-C   20 mg at 05/05/16 1646  . HYDROcodone-acetaminophen (NORCO/VICODIN) 5-325 MG per tablet 1-2 tablet  1-2 tablet Oral Q6H PRN Grier Mitts, PA-C   1 tablet at 05/06/16 0134  . levothyroxine (SYNTHROID, LEVOTHROID) tablet 100 mcg  100 mcg Oral QAC breakfast Grier Mitts, PA-C   100 mcg at 05/06/16 0924  . lisinopril (PRINIVIL,ZESTRIL) tablet 20 mg  20 mg Oral Daily Grier Mitts, PA-C   20 mg at 05/05/16 1645  . menthol-cetylpyridinium (CEPACOL) lozenge 3 mg  1 lozenge Oral PRN Grier Mitts, PA-C       Or  . phenol (CHLORASEPTIC) mouth spray 1 spray  1 spray Mouth/Throat PRN Grier Mitts, PA-C      . metoCLOPramide (REGLAN) tablet 5-10 mg  5-10 mg Oral Q8H PRN Grier Mitts, PA-C       Or  . metoCLOPramide (REGLAN) injection 5-10 mg  5-10 mg Intravenous Q8H PRN Grier Mitts, PA-C      . metoprolol (LOPRESSOR) tablet 50 mg  50 mg Oral BID Grier Mitts, PA-C   50 mg at 05/05/16 2158  . morphine 2 MG/ML injection 0.5 mg  0.5 mg Intravenous Q2H PRN Grier Mitts, PA-C      . ondansetron (ZOFRAN) tablet 4 mg  4 mg Oral Q6H PRN Grier Mitts, PA-C       Or  .  ondansetron (ZOFRAN) injection 4 mg  4 mg Intravenous Q6H PRN Grier Mitts, PA-C      . pantoprazole (PROTONIX) EC tablet 40 mg  40 mg Oral Daily Grier Mitts, PA-C   40 mg at 05/06/16 [id number]     Discharge Medications: Please see discharge summary for a list of discharge medications.  Relevant Imaging Results:  Relevant Lab Results:   Additional Information  (SS: [id number])  Bernita Buffy
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
## 2016-05-06 NOTE — Progress Notes (Signed)
OT Cancellation Note  Patient Details Name: Robin Ferguson MRN: [id number] DOB: 05/07/1923   Cancelled Treatment:    Reason Eval/Treat Not Completed: Other (comment) (plan for pt to d/c to SNF; will defer OT to next venue). Per PT/CSW note plan is for pt to d/c to SNF. Will defer OT to next venue of care. Please re-consult if needs change. Thank you for this referral.  Binnie Kand M.S., OTR/L Pager: [phone]  05/06/2016, 11:55 AM

## 2016-05-06 NOTE — Progress Notes (Signed)
Patient had unwitnessed fall, attending MD and orthopedic PA made aware.  Patient has complaints of right pinky (5th finger) pain.  Grier Mitts, Orthopedic PA to order x-ray.  No complaints of hip pain at this time.  Awaiting further orders, if necessary.  Will continue to monitor patient and assist primary RN.

## 2016-05-06 NOTE — Progress Notes (Signed)
Xray right 5th digit show mildly angulated 5th prox phalanx fracture. Dr. Tamera Punt to buddy tape 4th and 5th digit for immobilization. Okay WBAT with R hand.

## 2016-05-06 NOTE — Progress Notes (Signed)
   PATIENT ID: Robin Ferguson   1 Day Post-Op Procedure(s) (LRB): INTRAMEDULLARY (IM) NAIL FEMORAL (Left)  Subjective: Eating breakfast, doing well. pain is well-controlled  Objective:  Vitals:   05/06/16 0000 05/06/16 0441  BP: (!) 152/75 (!) 107/58  Pulse: 75 (!) 55  Resp: 16 16  Temp: 97.8 F (36.6 C) 98 F (36.7 C)     Left hip dressing clean dry and intact.  Neurovascularly intact.  Labs:   Recent Labs  05/05/16 0402 05/05/16 0412 05/06/16 0248  HGB 13.5 14.3 11.6*   Recent Labs  05/05/16 0402 05/05/16 0412 05/06/16 0248  WBC 11.8*  --  9.6  RBC 4.57  --  4.03  HCT 40.0 42.0 35.3*  PLT 238  --  211   Recent Labs  05/05/16 0412 05/06/16 0248  NA 132* 129*  K 3.6 3.8  CL 94* 97*  CO2  --  24  BUN 17 11  CREATININE 0.80 0.78  GLUCOSE 120* 99  CALCIUM  --  7.9*    Assessment and Plan:Doing well postoperative day #1 after left hip nail,  Left wrist Ulnar styloid fracture and Dorsal avulsion fracture Okay for weight-bearing as tolerated left lower extremity. I would allow her weightbearing as tolerated through her left wrist as well in her splintin an effort to try and mobilize her more effectively on a walker, understanding that she may have pain and will self limit.   VTE proph: Aspirin and SCDs

## 2016-05-07 DIAGNOSIS — E785 Hyperlipidemia, unspecified: Secondary | ICD-10-CM

## 2016-05-07 LAB — BASIC METABOLIC PANEL
Anion gap: 9 (ref 5–15)
BUN: 11 mg/dL (ref 6–20)
CHLORIDE: 98 mmol/L — AB (ref 101–111)
CO2: 24 mmol/L (ref 22–32)
Calcium: 8 mg/dL — ABNORMAL LOW (ref 8.9–10.3)
Creatinine, Ser: 0.75 mg/dL (ref 0.44–1.00)
GFR calc non Af Amer: >60 mL/min (ref >60)
Glucose, Bld: 98 mg/dL (ref 65–99)
POTASSIUM: 3.6 mmol/L (ref 3.5–5.1)
SODIUM: 131 mmol/L — AB (ref 135–145)

## 2016-05-07 LAB — CBC
HCT: 35.6 % — ABNORMAL LOW (ref 36.0–46.0)
HEMOGLOBIN: 11.7 g/dL — AB (ref 12.0–15.0)
MCH: 28.6 pg (ref 26.0–34.0)
MCHC: 32.9 g/dL (ref 30.0–36.0)
MCV: 87 fL (ref 78.0–100.0)
Platelets: 218 10*3/uL (ref 150–400)
RBC: 4.09 MIL/uL (ref 3.87–5.11)
RDW: 13.7 % (ref 11.5–15.5)
WBC: 9 10*3/uL (ref 4.0–10.5)
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
## 2016-05-07 NOTE — Progress Notes (Signed)
Spoke to patients daughter [name] about patients fall, daughter was at bedside at this time

## 2016-05-07 NOTE — Progress Notes (Signed)
Subjective: 2 Days Post-Op Procedure(s) (LRB): INTRAMEDULLARY (IM) NAIL FEMORAL (Left)   Patient also found to have left wrist fx and right small finger fracture. She is resting comfortably in bed this morning with no major complaints. She remains WBAT for both her hip and wrist.   Objective: Vital signs in last 24 hours: Temp:  [97.3 F (36.3 C)-99.7 F (37.6 C)] 97.3 F (36.3 C) (11/04 0505) Pulse Rate:  [63-75] 75 (11/04 0505) Resp:  [16-18] 18 (11/04 0505) BP: (99-134)/(49-96) 134/96 (11/04 0505) SpO2:  [90 %-94 %] 92 % (11/04 0505)  Labs:  Recent Labs  05/05/16 0402 05/05/16 0412 05/06/16 0248  HGB 13.5 14.3 11.6*    Recent Labs  05/05/16 0402 05/05/16 0412 05/06/16 0248  WBC 11.8*  --  9.6  RBC 4.57  --  4.03  HCT 40.0 42.0 35.3*  PLT 238  --  211    Recent Labs  05/05/16 0412 05/06/16 0248  NA 132* 129*  K 3.6 3.8  CL 94* 97*  CO2  --  24  BUN 17 11  CREATININE 0.80 0.78  GLUCOSE 120* 99  CALCIUM  --  7.9*    Recent Labs  05/05/16 0402  INR 1.04    Physical Exam:  Neurologically intact ABD soft Neurovascular intact Sensation intact distally Intact pulses distally Dorsiflexion/Plantar flexion intact Incision: dressing C/D/I and no drainage No cellulitis present Compartment soft  Assessment/Plan:  2 Days Post-Op Procedure(s) (LRB): INTRAMEDULLARY (IM) NAIL FEMORAL (Left) Advance diet Up with therapy  Continue WBAT with wrist and leg. Continue ASA for DVT prevention. We greatly appreciate medical management. Follow up with Dr. Tamera Punt in office 2 weeks post op.  Aliese Brannum, Larwance Sachs 05/07/2016, 7:34 AM
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
## 2016-05-08 LAB — CBC
HEMATOCRIT: 34.1 % — AB (ref 36.0–46.0)
HEMOGLOBIN: 11.1 g/dL — AB (ref 12.0–15.0)
MCH: 29.1 pg (ref 26.0–34.0)
MCHC: 32.6 g/dL (ref 30.0–36.0)
MCV: 89.3 fL (ref 78.0–100.0)
Platelets: 247 10*3/uL (ref 150–400)
RBC: 3.82 MIL/uL — ABNORMAL LOW (ref 3.87–5.11)
RDW: 14 % (ref 11.5–15.5)
WBC: 7.2 10*3/uL (ref 4.0–10.5)

## 2016-05-08 LAB — URINE CULTURE
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
## 2016-05-08 NOTE — Progress Notes (Signed)
Subjective: 3 Days Post-Op Procedure(s) (LRB): INTRAMEDULLARY (IM) NAIL FEMORAL (Left)  Patient also found to have left wrist fx and right small finger fracture. She is resting comfortably in bed this morning with no major complaints. She walked some with PT yesterday. Family is with patient in room this mornign. She remains WBAT for both her hip and wrist.  Objective: Vital signs in last 24 hours: Temp:  [97.1 F (36.2 C)-98.2 F (36.8 C)] 97.8 F (36.6 C) (11/05 0551) Pulse Rate:  [59-74] 64 (11/05 0918) Resp:  [18] 18 (11/05 0551) BP: (124-159)/(64-77) 124/67 (11/05 0918) SpO2:  [94 %-96 %] 96 % (11/05 0551)  Labs:  Recent Labs  05/06/16 0248 05/07/16 0720 05/08/16 0640  HGB 11.6* 11.7* 11.1*    Recent Labs  05/07/16 0720 05/08/16 0640  WBC 9.0 7.2  RBC 4.09 3.82*  HCT 35.6* 34.1*  PLT 218 247    Recent Labs  05/06/16 0248 05/07/16 0720  NA 129* 131*  K 3.8 3.6  CL 97* 98*  CO2 24 24  BUN 11 11  CREATININE 0.78 0.75  GLUCOSE 99 98  CALCIUM 7.9* 8.0*   No results for input(s): LABPT, INR in the last 72 hours.  Physical Exam:  Neurologically intact ABD soft Neurovascular intact Sensation intact distally Intact pulses distally Dorsiflexion/Plantar flexion intact Incision: dressing C/D/I and scant drainage No cellulitis present Compartment soft  Assessment/Plan:  3 Days Post-Op Procedure(s) (LRB): INTRAMEDULLARY (IM) NAIL FEMORAL (Left) Advance diet Up with therapy Discharge to SNF once cleared by medicine team. Continue WBAT with wrist and leg. Buddy tape small finger on right. Continue ASA for DVT prevention. We greatly appreciate medical management. Follow up with Dr. Tamera Punt in office 2 weeks post op.  Monti Jilek, Larwance Sachs 05/08/2016, 9:51 AM

## 2016-05-09 ENCOUNTER — Telehealth: Payer: Self-pay | Admitting: Family Medicine

## 2016-05-09 DIAGNOSIS — G309 Alzheimer's disease, unspecified: Secondary | ICD-10-CM | POA: Diagnosis not present

## 2016-05-09 DIAGNOSIS — Z87440 Personal history of urinary (tract) infections: Secondary | ICD-10-CM | POA: Diagnosis not present

## 2016-05-09 DIAGNOSIS — I10 Essential (primary) hypertension: Secondary | ICD-10-CM | POA: Diagnosis not present

## 2016-05-09 DIAGNOSIS — D649 Anemia, unspecified: Secondary | ICD-10-CM | POA: Diagnosis not present

## 2016-05-09 DIAGNOSIS — E785 Hyperlipidemia, unspecified: Secondary | ICD-10-CM | POA: Diagnosis not present

## 2016-05-09 DIAGNOSIS — S52612A Displaced fracture of left ulna styloid process, initial encounter for closed fracture: Secondary | ICD-10-CM | POA: Diagnosis not present

## 2016-05-09 DIAGNOSIS — S72125F Nondisplaced fracture of lesser trochanter of left femur, subsequent encounter for open fracture type IIIA, IIIB, or IIIC with routine healing: Secondary | ICD-10-CM | POA: Diagnosis not present

## 2016-05-09 DIAGNOSIS — N390 Urinary tract infection, site not specified: Secondary | ICD-10-CM | POA: Diagnosis not present

## 2016-05-09 DIAGNOSIS — S62616D Displaced fracture of proximal phalanx of right little finger, subsequent encounter for fracture with routine healing: Secondary | ICD-10-CM | POA: Diagnosis not present

## 2016-05-09 DIAGNOSIS — S72142A Displaced intertrochanteric fracture of left femur, initial encounter for closed fracture: Secondary | ICD-10-CM | POA: Diagnosis not present

## 2016-05-09 DIAGNOSIS — S72142D Displaced intertrochanteric fracture of left femur, subsequent encounter for closed fracture with routine healing: Secondary | ICD-10-CM | POA: Diagnosis not present

## 2016-05-09 DIAGNOSIS — F329 Major depressive disorder, single episode, unspecified: Secondary | ICD-10-CM | POA: Diagnosis not present

## 2016-05-09 DIAGNOSIS — I2510 Atherosclerotic heart disease of native coronary artery without angina pectoris: Secondary | ICD-10-CM | POA: Diagnosis not present

## 2016-05-09 DIAGNOSIS — B9620 Unspecified Escherichia coli [E. coli] as the cause of diseases classified elsewhere: Secondary | ICD-10-CM

## 2016-05-09 DIAGNOSIS — C44729 Squamous cell carcinoma of skin of left lower limb, including hip: Secondary | ICD-10-CM | POA: Diagnosis not present

## 2016-05-09 DIAGNOSIS — M79644 Pain in right finger(s): Secondary | ICD-10-CM | POA: Diagnosis not present

## 2016-05-09 DIAGNOSIS — S62112D Displaced fracture of triquetrum [cuneiform] bone, left wrist, subsequent encounter for fracture with routine healing: Secondary | ICD-10-CM | POA: Diagnosis not present

## 2016-05-09 DIAGNOSIS — M25552 Pain in left hip: Secondary | ICD-10-CM | POA: Diagnosis present

## 2016-05-09 DIAGNOSIS — S72145D Nondisplaced intertrochanteric fracture of left femur, subsequent encounter for closed fracture with routine healing: Secondary | ICD-10-CM | POA: Diagnosis not present

## 2016-05-09 DIAGNOSIS — S72002D Fracture of unspecified part of neck of left femur, subsequent encounter for closed fracture with routine healing: Secondary | ICD-10-CM | POA: Diagnosis not present

## 2016-05-09 DIAGNOSIS — Z23 Encounter for immunization: Secondary | ICD-10-CM | POA: Diagnosis not present

## 2016-05-09 DIAGNOSIS — R262 Difficulty in walking, not elsewhere classified: Secondary | ICD-10-CM | POA: Diagnosis not present

## 2016-05-09 DIAGNOSIS — W06XXXA Fall from bed, initial encounter: Secondary | ICD-10-CM | POA: Diagnosis present

## 2016-05-09 DIAGNOSIS — D72829 Elevated white blood cell count, unspecified: Secondary | ICD-10-CM | POA: Diagnosis not present

## 2016-05-09 DIAGNOSIS — Z7982 Long term (current) use of aspirin: Secondary | ICD-10-CM | POA: Diagnosis not present

## 2016-05-09 DIAGNOSIS — S52615A Nondisplaced fracture of left ulna styloid process, initial encounter for closed fracture: Secondary | ICD-10-CM | POA: Diagnosis not present

## 2016-05-09 DIAGNOSIS — S52612D Displaced fracture of left ulna styloid process, subsequent encounter for closed fracture with routine healing: Secondary | ICD-10-CM | POA: Diagnosis not present

## 2016-05-09 DIAGNOSIS — R278 Other lack of coordination: Secondary | ICD-10-CM | POA: Diagnosis not present

## 2016-05-09 DIAGNOSIS — E039 Hypothyroidism, unspecified: Secondary | ICD-10-CM | POA: Diagnosis not present

## 2016-05-09 DIAGNOSIS — M8580 Other specified disorders of bone density and structure, unspecified site: Secondary | ICD-10-CM | POA: Diagnosis not present

## 2016-05-09 DIAGNOSIS — S62601D Fracture of unspecified phalanx of left index finger, subsequent encounter for fracture with routine healing: Secondary | ICD-10-CM | POA: Diagnosis not present

## 2016-05-09 DIAGNOSIS — R29898 Other symptoms and signs involving the musculoskeletal system: Secondary | ICD-10-CM | POA: Diagnosis not present

## 2016-05-09 DIAGNOSIS — Z719 Counseling, unspecified: Secondary | ICD-10-CM | POA: Diagnosis not present

## 2016-05-09 DIAGNOSIS — S72146E Nondisplaced intertrochanteric fracture of unspecified femur, subsequent encounter for open fracture type I or II with routine healing: Secondary | ICD-10-CM | POA: Diagnosis not present

## 2016-05-09 DIAGNOSIS — Y92122 Bedroom in nursing home as the place of occurrence of the external cause: Secondary | ICD-10-CM | POA: Diagnosis not present

## 2016-05-09 DIAGNOSIS — F0150 Vascular dementia without behavioral disturbance: Secondary | ICD-10-CM | POA: Diagnosis not present

## 2016-05-09 DIAGNOSIS — M6281 Muscle weakness (generalized): Secondary | ICD-10-CM | POA: Diagnosis not present

## 2016-05-09 DIAGNOSIS — R509 Fever, unspecified: Secondary | ICD-10-CM | POA: Diagnosis not present

## 2016-05-09 DIAGNOSIS — Z9181 History of falling: Secondary | ICD-10-CM | POA: Diagnosis not present

## 2016-05-09 DIAGNOSIS — S72102D Unspecified trochanteric fracture of left femur, subsequent encounter for closed fracture with routine healing: Secondary | ICD-10-CM | POA: Diagnosis not present

## 2016-05-09 DIAGNOSIS — W19XXXD Unspecified fall, subsequent encounter: Secondary | ICD-10-CM | POA: Diagnosis not present

## 2016-05-09 DIAGNOSIS — F0280 Dementia in other diseases classified elsewhere without behavioral disturbance: Secondary | ICD-10-CM | POA: Diagnosis not present

## 2016-05-09 MED ORDER — HYDROCODONE-ACETAMINOPHEN 5-325 MG PO TABS: 1.0000 | ORAL_TABLET | Freq: Four times a day (QID) | ORAL | 0 refills | Status: DC | PRN

## 2016-05-09 MED ORDER — ASPIRIN 325 MG PO TBEC: 325.0000 mg | DELAYED_RELEASE_TABLET | Freq: Every day | ORAL | 0 refills | Status: AC

## 2016-05-09 NOTE — Clinical Social Work Placement (Signed)
   CLINICAL SOCIAL WORK PLACEMENT  NOTE  Date:  05/09/2016  Patient Details  Name: Robin Ferguson MRN: [id number] Date of Birth: 1923-04-06  Clinical Social Work is seeking post-discharge placement for this patient at the [place] level of care (*CSW will initial, date and re-position this form in  chart as items are completed):  Yes   Patient/family provided with [place] Work Department's list of facilities offering this level of care within the geographic area requested by the patient (or if unable, by the patient's family).  Yes   Patient/family informed of their freedom to choose among providers that offer the needed level of care, that participate in Medicare, Medicaid or managed care program needed by the patient, have an available bed and are willing to accept the patient.  Yes   Patient/family informed of [facility]'s ownership interest in [facility] and [facility], as well as of the fact that they are under no obligation to receive care at these facilities.  PASRR submitted to EDS on 05/09/16     PASRR number received on 05/09/16     Existing PASRR number confirmed on       FL2 transmitted to all facilities in geographic area requested by pt/family on 05/06/16     FL2 transmitted to all facilities within larger geographic area on       Patient informed that his/her managed care company has contracts with or will negotiate with certain facilities, including the following:        Yes   Patient/family informed of bed offers received.  Patient chooses bed at [facility]     Physician recommends and patient chooses bed at      Patient to be transferred to [facility] on 05/09/16.  Patient to be transferred to facility by ptar     Patient family notified on 05/09/16 of transfer.  Name of family member notified:  [name]     PHYSICIAN Please sign DNR, Please sign FL2     Additional Comment:     _______________________________________________ Jorge Ny, LCSW 05/09/2016, 12:56 PM

## 2016-05-09 NOTE — Progress Notes (Signed)
Patient discharge to [facility], discharge packet faxed by MSW. Attempted to call report x 2  No answer. Ptar transporting patient, family aware of transfer.  Charonda Hefter, Tivis Ringer, RN

## 2016-05-09 NOTE — Care Management Important Message (Signed)
Important Message  Patient Details  Name: Robin Ferguson MRN: [id number] Date of Birth: Jul 28, 1922   Medicare Important Message Given:  Yes    [name] 05/09/2016, 11:14 AM

## 2016-05-09 NOTE — Telephone Encounter (Signed)
Robin Ferguson with Primary Navigator wanted to make the office aware that the pt is anticipating to be discharged to a nursing facility before going back to [facility].
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
## 2016-05-09 NOTE — Progress Notes (Signed)
I was obligated to give the patient haldol for agitation and restlessness despite daughter's request not to give haldol after 9 pm because of her safety. Pt is a high risk for fall and was pulling on her splint to her left hand, climbing out from bed and messing with her IV site. She has been impulsive for the last hour. Attempts to reach her two daughters were unsuccessful. Will continue monitoring.

## 2016-05-09 NOTE — Progress Notes (Signed)
CRITICAL VALUE ALERT  Critical value received:  Troponin  Date of notification:  05/09/16  Time of notification:  [id number]  Critical value read back:Yes.    Nurse who received alert:  Dorien Chihuahua  MD notified (1st page):  Algis Liming [facility]  Time of first page:  38  MD notified (2nd page):  Time of second page:  Responding MD:  Algis Liming [facility]  Time MD responded:  [phone]

## 2016-05-09 NOTE — Telephone Encounter (Signed)
Thanks for update- would like to follow up with patient when discharged home

## 2016-05-09 NOTE — Consult Note (Signed)
[facility] CM Primary Care Navigator  05/09/2016  Robin Ferguson September 22, 1922 [id number]   Met with patient and daughter Robin Ferguson) at the bedside to identify possible discharge needs.  Patient is pleasantly confused and disoriented related to Alzheimer's dementia. Patient resides at [facility] (memory unit) and had an unwitnessed fall that had led to this admission/ surgery. Patient's daughter endorses Dr. Garret Reddish with [facility] as the primary care provider.    Daughter states medications are obtained from [facility] ([place]) and Pa[facility] ([place]) without any problem.  Daughter reports med techs from the facility assist in managing medications for patient.   Daughters [name] and Robin Ferguson) provide transportation to her doctors' appointments.   [employer] staff serves as primary caregivers for patient and provides care for her needs as stated by daughter.    According to daughter, discharge plan is for skilled nursing facility for short term rehabilitation prior to going back to [facility]. Daughter voiced preference of [facility] or [facility] SNF.  Patient's daughter voiced understanding to call primary care provider's office once discharged to [facility], for a post discharge follow-up appointment within a week or sooner if needs arise.  Patient letter provided as a reminder.  Primary Care provider's office called Earlene Plater) and notified of patient's anticipated discharge to skilled nursing facility.  For additional questions please contact:  Edwena Felty A. Itzell Bendavid, BSN, RN-BC [facility] PRIMARY CARE Navigator Cell: [phone]

## 2016-05-09 NOTE — Progress Notes (Signed)
PT Cancellation Note  Patient Details Name: Robin Ferguson MRN: [id number] DOB: 08-08-1922   Cancelled Treatment:    Reason Eval/Treat Not Completed: Patient declined. Attempted supine bed exercises and pt is resistant to all activities. Pt to DC to SNF today.  Unable to perform any skilled treatment due to cognitive status.  Scheryl Marten PT, DPT  [phone]  05/09/2016, 11:25 AM

## 2016-05-09 NOTE — Progress Notes (Signed)
CSW provided bed offers to pt dtr [name]- no choice at this time.  CSW will continue to follow  Jorge Ny, LCSW Clinical Social Worker [phone]

## 2016-05-09 NOTE — Progress Notes (Signed)
Patient will discharge to [facility] Anticipated discharge date: 11/6 Family notified: [name] Transportation by Corey Harold- scheduled for 3pm  CSW signing off.  Jorge Ny, LCSW Clinical Social Worker [phone]

## 2016-05-09 NOTE — Progress Notes (Signed)
   PATIENT ID: Robin Ferguson   4 Days Post-Op Procedure(s) (LRB): INTRAMEDULLARY (IM) NAIL FEMORAL (Left)  Subjective: Doing well this morning. Denies pain.   Objective:  Vitals:   05/08/16 2015 05/09/16 0534  BP: (!) 164/65 (!) 159/70  Pulse: 65 (!) 59  Resp: 16 16  Temp: 98.6 F (37 C) 98 F (36.7 C)     L arm splint intact, wiggles fingers, distally NVI R fingers buddy tape intact with no pain L LE dressing c/d/i Wiggles toes, distally NVI Calf soft, nontender  Labs:   Recent Labs  05/07/16 0720 05/08/16 0640  HGB 11.7* 11.1*   Recent Labs  05/07/16 0720 05/08/16 0640  WBC 9.0 7.2  RBC 4.09 3.82*  HCT 35.6* 34.1*  PLT 218 247   Recent Labs  05/07/16 0720  NA 131*  K 3.6  CL 98*  CO2 24  BUN 11  CREATININE 0.75  GLUCOSE 98  CALCIUM 8.0*    Assessment and Plan: Up with therapy Discharge to SNF once cleared by medicine team. Continue WBAT with wrist and leg. Buddy tape small finger on right. Continue ASA for DVT prevention. We greatly appreciate medical management. Follow up with Dr. Tamera Punt in office 2 weeks post op.

## 2016-05-10 DIAGNOSIS — I10 Essential (primary) hypertension: Secondary | ICD-10-CM | POA: Diagnosis not present

## 2016-05-10 DIAGNOSIS — F0150 Vascular dementia without behavioral disturbance: Secondary | ICD-10-CM | POA: Diagnosis not present

## 2016-05-10 DIAGNOSIS — E039 Hypothyroidism, unspecified: Secondary | ICD-10-CM | POA: Diagnosis not present

## 2016-05-10 DIAGNOSIS — D649 Anemia, unspecified: Secondary | ICD-10-CM | POA: Diagnosis not present

## 2016-05-10 DIAGNOSIS — S72125F Nondisplaced fracture of lesser trochanter of left femur, subsequent encounter for open fracture type IIIA, IIIB, or IIIC with routine healing: Secondary | ICD-10-CM | POA: Diagnosis not present

## 2016-05-16 DIAGNOSIS — Z719 Counseling, unspecified: Secondary | ICD-10-CM | POA: Diagnosis not present

## 2016-05-16 DIAGNOSIS — R509 Fever, unspecified: Secondary | ICD-10-CM | POA: Diagnosis not present

## 2016-05-23 DIAGNOSIS — S62112D Displaced fracture of triquetrum [cuneiform] bone, left wrist, subsequent encounter for fracture with routine healing: Secondary | ICD-10-CM | POA: Diagnosis not present

## 2016-05-23 DIAGNOSIS — S62616D Displaced fracture of proximal phalanx of right little finger, subsequent encounter for fracture with routine healing: Secondary | ICD-10-CM | POA: Diagnosis not present

## 2016-05-23 DIAGNOSIS — S72142D Displaced intertrochanteric fracture of left femur, subsequent encounter for closed fracture with routine healing: Secondary | ICD-10-CM | POA: Diagnosis not present

## 2016-05-30 DIAGNOSIS — F0150 Vascular dementia without behavioral disturbance: Secondary | ICD-10-CM | POA: Diagnosis not present

## 2016-05-30 DIAGNOSIS — S72146E Nondisplaced intertrochanteric fracture of unspecified femur, subsequent encounter for open fracture type I or II with routine healing: Secondary | ICD-10-CM | POA: Diagnosis not present

## 2016-05-30 DIAGNOSIS — I10 Essential (primary) hypertension: Secondary | ICD-10-CM | POA: Diagnosis not present

## 2016-05-31 DIAGNOSIS — G309 Alzheimer's disease, unspecified: Secondary | ICD-10-CM | POA: Diagnosis not present

## 2016-05-31 DIAGNOSIS — C44729 Squamous cell carcinoma of skin of left lower limb, including hip: Secondary | ICD-10-CM | POA: Diagnosis not present

## 2016-05-31 DIAGNOSIS — S72102D Unspecified trochanteric fracture of left femur, subsequent encounter for closed fracture with routine healing: Secondary | ICD-10-CM | POA: Diagnosis not present

## 2016-05-31 DIAGNOSIS — S52612D Displaced fracture of left ulna styloid process, subsequent encounter for closed fracture with routine healing: Secondary | ICD-10-CM | POA: Diagnosis not present

## 2016-05-31 DIAGNOSIS — F0280 Dementia in other diseases classified elsewhere without behavioral disturbance: Secondary | ICD-10-CM | POA: Diagnosis not present

## 2016-05-31 DIAGNOSIS — S62601D Fracture of unspecified phalanx of left index finger, subsequent encounter for fracture with routine healing: Secondary | ICD-10-CM | POA: Diagnosis not present

## 2016-06-01 DIAGNOSIS — I10 Essential (primary) hypertension: Secondary | ICD-10-CM | POA: Diagnosis not present

## 2016-06-01 DIAGNOSIS — S72102D Unspecified trochanteric fracture of left femur, subsequent encounter for closed fracture with routine healing: Secondary | ICD-10-CM | POA: Diagnosis not present

## 2016-06-01 DIAGNOSIS — J309 Allergic rhinitis, unspecified: Secondary | ICD-10-CM | POA: Diagnosis not present

## 2016-06-01 DIAGNOSIS — F0280 Dementia in other diseases classified elsewhere without behavioral disturbance: Secondary | ICD-10-CM | POA: Diagnosis not present

## 2016-06-01 DIAGNOSIS — C44729 Squamous cell carcinoma of skin of left lower limb, including hip: Secondary | ICD-10-CM | POA: Diagnosis not present

## 2016-06-01 DIAGNOSIS — G309 Alzheimer's disease, unspecified: Secondary | ICD-10-CM | POA: Diagnosis not present

## 2016-06-01 DIAGNOSIS — S52612D Displaced fracture of left ulna styloid process, subsequent encounter for closed fracture with routine healing: Secondary | ICD-10-CM | POA: Diagnosis not present

## 2016-06-01 DIAGNOSIS — R296 Repeated falls: Secondary | ICD-10-CM | POA: Diagnosis not present

## 2016-06-01 DIAGNOSIS — M6281 Muscle weakness (generalized): Secondary | ICD-10-CM | POA: Diagnosis not present

## 2016-06-01 DIAGNOSIS — E039 Hypothyroidism, unspecified: Secondary | ICD-10-CM | POA: Diagnosis not present

## 2016-06-01 DIAGNOSIS — K5900 Constipation, unspecified: Secondary | ICD-10-CM | POA: Diagnosis not present

## 2016-06-01 DIAGNOSIS — K219 Gastro-esophageal reflux disease without esophagitis: Secondary | ICD-10-CM | POA: Diagnosis not present

## 2016-06-01 DIAGNOSIS — R2689 Other abnormalities of gait and mobility: Secondary | ICD-10-CM | POA: Diagnosis not present

## 2016-06-01 DIAGNOSIS — S62601D Fracture of unspecified phalanx of left index finger, subsequent encounter for fracture with routine healing: Secondary | ICD-10-CM | POA: Diagnosis not present

## 2016-06-02 DIAGNOSIS — F0280 Dementia in other diseases classified elsewhere without behavioral disturbance: Secondary | ICD-10-CM | POA: Diagnosis not present

## 2016-06-02 DIAGNOSIS — S72102D Unspecified trochanteric fracture of left femur, subsequent encounter for closed fracture with routine healing: Secondary | ICD-10-CM | POA: Diagnosis not present

## 2016-06-02 DIAGNOSIS — S52612D Displaced fracture of left ulna styloid process, subsequent encounter for closed fracture with routine healing: Secondary | ICD-10-CM | POA: Diagnosis not present

## 2016-06-02 DIAGNOSIS — C44729 Squamous cell carcinoma of skin of left lower limb, including hip: Secondary | ICD-10-CM | POA: Diagnosis not present

## 2016-06-02 DIAGNOSIS — S62601D Fracture of unspecified phalanx of left index finger, subsequent encounter for fracture with routine healing: Secondary | ICD-10-CM | POA: Diagnosis not present

## 2016-06-02 DIAGNOSIS — G309 Alzheimer's disease, unspecified: Secondary | ICD-10-CM | POA: Diagnosis not present

## 2016-06-04 ENCOUNTER — Encounter: Payer: Self-pay | Admitting: Family Medicine

## 2016-06-06 DIAGNOSIS — S52612D Displaced fracture of left ulna styloid process, subsequent encounter for closed fracture with routine healing: Secondary | ICD-10-CM | POA: Diagnosis not present

## 2016-06-06 DIAGNOSIS — G309 Alzheimer's disease, unspecified: Secondary | ICD-10-CM | POA: Diagnosis not present

## 2016-06-06 DIAGNOSIS — S72102D Unspecified trochanteric fracture of left femur, subsequent encounter for closed fracture with routine healing: Secondary | ICD-10-CM | POA: Diagnosis not present

## 2016-06-06 DIAGNOSIS — C44729 Squamous cell carcinoma of skin of left lower limb, including hip: Secondary | ICD-10-CM | POA: Diagnosis not present

## 2016-06-06 DIAGNOSIS — F0280 Dementia in other diseases classified elsewhere without behavioral disturbance: Secondary | ICD-10-CM | POA: Diagnosis not present

## 2016-06-06 DIAGNOSIS — S62601D Fracture of unspecified phalanx of left index finger, subsequent encounter for fracture with routine healing: Secondary | ICD-10-CM | POA: Diagnosis not present

## 2016-06-07 DIAGNOSIS — C44729 Squamous cell carcinoma of skin of left lower limb, including hip: Secondary | ICD-10-CM | POA: Diagnosis not present

## 2016-06-07 DIAGNOSIS — S72102D Unspecified trochanteric fracture of left femur, subsequent encounter for closed fracture with routine healing: Secondary | ICD-10-CM | POA: Diagnosis not present

## 2016-06-07 DIAGNOSIS — S52612D Displaced fracture of left ulna styloid process, subsequent encounter for closed fracture with routine healing: Secondary | ICD-10-CM | POA: Diagnosis not present

## 2016-06-07 DIAGNOSIS — G309 Alzheimer's disease, unspecified: Secondary | ICD-10-CM | POA: Diagnosis not present

## 2016-06-07 DIAGNOSIS — F0280 Dementia in other diseases classified elsewhere without behavioral disturbance: Secondary | ICD-10-CM | POA: Diagnosis not present

## 2016-06-07 DIAGNOSIS — S62601D Fracture of unspecified phalanx of left index finger, subsequent encounter for fracture with routine healing: Secondary | ICD-10-CM | POA: Diagnosis not present

## 2016-06-08 DIAGNOSIS — F0391 Unspecified dementia with behavioral disturbance: Secondary | ICD-10-CM | POA: Diagnosis not present

## 2016-06-08 DIAGNOSIS — Z79899 Other long term (current) drug therapy: Secondary | ICD-10-CM | POA: Diagnosis not present

## 2016-06-08 DIAGNOSIS — F419 Anxiety disorder, unspecified: Secondary | ICD-10-CM | POA: Diagnosis not present

## 2016-06-09 DIAGNOSIS — S72102D Unspecified trochanteric fracture of left femur, subsequent encounter for closed fracture with routine healing: Secondary | ICD-10-CM | POA: Diagnosis not present

## 2016-06-09 DIAGNOSIS — F0280 Dementia in other diseases classified elsewhere without behavioral disturbance: Secondary | ICD-10-CM | POA: Diagnosis not present

## 2016-06-09 DIAGNOSIS — S62601D Fracture of unspecified phalanx of left index finger, subsequent encounter for fracture with routine healing: Secondary | ICD-10-CM | POA: Diagnosis not present

## 2016-06-09 DIAGNOSIS — S52612D Displaced fracture of left ulna styloid process, subsequent encounter for closed fracture with routine healing: Secondary | ICD-10-CM | POA: Diagnosis not present

## 2016-06-09 DIAGNOSIS — G309 Alzheimer's disease, unspecified: Secondary | ICD-10-CM | POA: Diagnosis not present

## 2016-06-09 DIAGNOSIS — C44729 Squamous cell carcinoma of skin of left lower limb, including hip: Secondary | ICD-10-CM | POA: Diagnosis not present

## 2016-06-10 DIAGNOSIS — S62601D Fracture of unspecified phalanx of left index finger, subsequent encounter for fracture with routine healing: Secondary | ICD-10-CM | POA: Diagnosis not present

## 2016-06-10 DIAGNOSIS — G309 Alzheimer's disease, unspecified: Secondary | ICD-10-CM | POA: Diagnosis not present

## 2016-06-10 DIAGNOSIS — S72102D Unspecified trochanteric fracture of left femur, subsequent encounter for closed fracture with routine healing: Secondary | ICD-10-CM | POA: Diagnosis not present

## 2016-06-10 DIAGNOSIS — F0280 Dementia in other diseases classified elsewhere without behavioral disturbance: Secondary | ICD-10-CM | POA: Diagnosis not present

## 2016-06-10 DIAGNOSIS — C44729 Squamous cell carcinoma of skin of left lower limb, including hip: Secondary | ICD-10-CM | POA: Diagnosis not present

## 2016-06-10 DIAGNOSIS — S52612D Displaced fracture of left ulna styloid process, subsequent encounter for closed fracture with routine healing: Secondary | ICD-10-CM | POA: Diagnosis not present

## 2016-06-13 DIAGNOSIS — S72102D Unspecified trochanteric fracture of left femur, subsequent encounter for closed fracture with routine healing: Secondary | ICD-10-CM | POA: Diagnosis not present

## 2016-06-13 DIAGNOSIS — F0280 Dementia in other diseases classified elsewhere without behavioral disturbance: Secondary | ICD-10-CM | POA: Diagnosis not present

## 2016-06-13 DIAGNOSIS — C44729 Squamous cell carcinoma of skin of left lower limb, including hip: Secondary | ICD-10-CM | POA: Diagnosis not present

## 2016-06-13 DIAGNOSIS — S52612D Displaced fracture of left ulna styloid process, subsequent encounter for closed fracture with routine healing: Secondary | ICD-10-CM | POA: Diagnosis not present

## 2016-06-13 DIAGNOSIS — S62601D Fracture of unspecified phalanx of left index finger, subsequent encounter for fracture with routine healing: Secondary | ICD-10-CM | POA: Diagnosis not present

## 2016-06-13 DIAGNOSIS — G309 Alzheimer's disease, unspecified: Secondary | ICD-10-CM | POA: Diagnosis not present

## 2016-06-15 DIAGNOSIS — S72102D Unspecified trochanteric fracture of left femur, subsequent encounter for closed fracture with routine healing: Secondary | ICD-10-CM | POA: Diagnosis not present

## 2016-06-15 DIAGNOSIS — S52612D Displaced fracture of left ulna styloid process, subsequent encounter for closed fracture with routine healing: Secondary | ICD-10-CM | POA: Diagnosis not present

## 2016-06-15 DIAGNOSIS — S62601D Fracture of unspecified phalanx of left index finger, subsequent encounter for fracture with routine healing: Secondary | ICD-10-CM | POA: Diagnosis not present

## 2016-06-15 DIAGNOSIS — F0280 Dementia in other diseases classified elsewhere without behavioral disturbance: Secondary | ICD-10-CM | POA: Diagnosis not present

## 2016-06-15 DIAGNOSIS — C44729 Squamous cell carcinoma of skin of left lower limb, including hip: Secondary | ICD-10-CM | POA: Diagnosis not present

## 2016-06-15 DIAGNOSIS — G309 Alzheimer's disease, unspecified: Secondary | ICD-10-CM | POA: Diagnosis not present

## 2016-06-16 DIAGNOSIS — Z79899 Other long term (current) drug therapy: Secondary | ICD-10-CM | POA: Diagnosis not present

## 2016-06-16 DIAGNOSIS — F0391 Unspecified dementia with behavioral disturbance: Secondary | ICD-10-CM | POA: Diagnosis not present

## 2016-06-21 DIAGNOSIS — F0280 Dementia in other diseases classified elsewhere without behavioral disturbance: Secondary | ICD-10-CM | POA: Diagnosis not present

## 2016-06-21 DIAGNOSIS — S72102D Unspecified trochanteric fracture of left femur, subsequent encounter for closed fracture with routine healing: Secondary | ICD-10-CM | POA: Diagnosis not present

## 2016-06-21 DIAGNOSIS — G309 Alzheimer's disease, unspecified: Secondary | ICD-10-CM | POA: Diagnosis not present

## 2016-06-21 DIAGNOSIS — S62601D Fracture of unspecified phalanx of left index finger, subsequent encounter for fracture with routine healing: Secondary | ICD-10-CM | POA: Diagnosis not present

## 2016-06-21 DIAGNOSIS — C44729 Squamous cell carcinoma of skin of left lower limb, including hip: Secondary | ICD-10-CM | POA: Diagnosis not present

## 2016-06-21 DIAGNOSIS — S52612D Displaced fracture of left ulna styloid process, subsequent encounter for closed fracture with routine healing: Secondary | ICD-10-CM | POA: Diagnosis not present

## 2016-06-22 DIAGNOSIS — S62616D Displaced fracture of proximal phalanx of right little finger, subsequent encounter for fracture with routine healing: Secondary | ICD-10-CM | POA: Diagnosis not present

## 2016-06-22 DIAGNOSIS — S72142D Displaced intertrochanteric fracture of left femur, subsequent encounter for closed fracture with routine healing: Secondary | ICD-10-CM | POA: Diagnosis not present

## 2016-06-22 DIAGNOSIS — S62112D Displaced fracture of triquetrum [cuneiform] bone, left wrist, subsequent encounter for fracture with routine healing: Secondary | ICD-10-CM | POA: Diagnosis not present

## 2016-06-28 DIAGNOSIS — F0280 Dementia in other diseases classified elsewhere without behavioral disturbance: Secondary | ICD-10-CM | POA: Diagnosis not present

## 2016-06-28 DIAGNOSIS — G309 Alzheimer's disease, unspecified: Secondary | ICD-10-CM | POA: Diagnosis not present

## 2016-06-28 DIAGNOSIS — S72102D Unspecified trochanteric fracture of left femur, subsequent encounter for closed fracture with routine healing: Secondary | ICD-10-CM | POA: Diagnosis not present

## 2016-06-28 DIAGNOSIS — C44729 Squamous cell carcinoma of skin of left lower limb, including hip: Secondary | ICD-10-CM | POA: Diagnosis not present

## 2016-06-28 DIAGNOSIS — S62601D Fracture of unspecified phalanx of left index finger, subsequent encounter for fracture with routine healing: Secondary | ICD-10-CM | POA: Diagnosis not present

## 2016-06-28 DIAGNOSIS — S52612D Displaced fracture of left ulna styloid process, subsequent encounter for closed fracture with routine healing: Secondary | ICD-10-CM | POA: Diagnosis not present

## 2016-06-30 DIAGNOSIS — I10 Essential (primary) hypertension: Secondary | ICD-10-CM | POA: Diagnosis not present

## 2016-06-30 DIAGNOSIS — M6281 Muscle weakness (generalized): Secondary | ICD-10-CM | POA: Diagnosis not present

## 2016-07-05 DIAGNOSIS — F0280 Dementia in other diseases classified elsewhere without behavioral disturbance: Secondary | ICD-10-CM | POA: Diagnosis not present

## 2016-07-05 DIAGNOSIS — S62601D Fracture of unspecified phalanx of left index finger, subsequent encounter for fracture with routine healing: Secondary | ICD-10-CM | POA: Diagnosis not present

## 2016-07-05 DIAGNOSIS — C44729 Squamous cell carcinoma of skin of left lower limb, including hip: Secondary | ICD-10-CM | POA: Diagnosis not present

## 2016-07-05 DIAGNOSIS — S72102D Unspecified trochanteric fracture of left femur, subsequent encounter for closed fracture with routine healing: Secondary | ICD-10-CM | POA: Diagnosis not present

## 2016-07-05 DIAGNOSIS — S52612D Displaced fracture of left ulna styloid process, subsequent encounter for closed fracture with routine healing: Secondary | ICD-10-CM | POA: Diagnosis not present

## 2016-07-05 DIAGNOSIS — G309 Alzheimer's disease, unspecified: Secondary | ICD-10-CM | POA: Diagnosis not present

## 2016-07-10 ENCOUNTER — Observation Stay (HOSPITAL_COMMUNITY)
Admission: EM | Admit: 2016-07-10 | Discharge: 2016-07-11 | Disposition: A | Discharge status: Hospice | Payer: Medicare Other | Attending: Internal Medicine | Admitting: Internal Medicine

## 2016-07-10 ENCOUNTER — Emergency Department (HOSPITAL_COMMUNITY): Payer: Medicare Other

## 2016-07-10 ENCOUNTER — Encounter (HOSPITAL_COMMUNITY): Payer: Self-pay | Admitting: Emergency Medicine

## 2016-07-10 DIAGNOSIS — Z66 Do not resuscitate: Secondary | ICD-10-CM | POA: Diagnosis not present

## 2016-07-10 DIAGNOSIS — I6789 Other cerebrovascular disease: Secondary | ICD-10-CM | POA: Diagnosis not present

## 2016-07-10 DIAGNOSIS — I10 Essential (primary) hypertension: Secondary | ICD-10-CM | POA: Diagnosis not present

## 2016-07-10 DIAGNOSIS — I6340 Cerebral infarction due to embolism of unspecified cerebral artery: Secondary | ICD-10-CM | POA: Diagnosis present

## 2016-07-10 DIAGNOSIS — Z87891 Personal history of nicotine dependence: Secondary | ICD-10-CM | POA: Diagnosis not present

## 2016-07-10 DIAGNOSIS — F0390 Unspecified dementia without behavioral disturbance: Secondary | ICD-10-CM | POA: Diagnosis not present

## 2016-07-10 DIAGNOSIS — I639 Cerebral infarction, unspecified: Secondary | ICD-10-CM | POA: Diagnosis not present

## 2016-07-10 DIAGNOSIS — E039 Hypothyroidism, unspecified: Secondary | ICD-10-CM | POA: Diagnosis not present

## 2016-07-10 DIAGNOSIS — R29818 Other symptoms and signs involving the nervous system: Secondary | ICD-10-CM | POA: Diagnosis not present

## 2016-07-10 DIAGNOSIS — R4182 Altered mental status, unspecified: Secondary | ICD-10-CM | POA: Diagnosis present

## 2016-07-10 DIAGNOSIS — R4781 Slurred speech: Secondary | ICD-10-CM | POA: Diagnosis not present

## 2016-07-10 DIAGNOSIS — R29707 NIHSS score 7: Secondary | ICD-10-CM | POA: Diagnosis not present

## 2016-07-10 DIAGNOSIS — Z7951 Long term (current) use of inhaled steroids: Secondary | ICD-10-CM | POA: Insufficient documentation

## 2016-07-10 DIAGNOSIS — Z79899 Other long term (current) drug therapy: Secondary | ICD-10-CM | POA: Diagnosis not present

## 2016-07-10 DIAGNOSIS — N179 Acute kidney failure, unspecified: Secondary | ICD-10-CM | POA: Diagnosis not present

## 2016-07-10 DIAGNOSIS — R531 Weakness: Secondary | ICD-10-CM | POA: Diagnosis not present

## 2016-07-10 DIAGNOSIS — R29810 Facial weakness: Secondary | ICD-10-CM | POA: Diagnosis not present

## 2016-07-10 DIAGNOSIS — I63411 Cerebral infarction due to embolism of right middle cerebral artery: Secondary | ICD-10-CM | POA: Diagnosis not present

## 2016-07-10 LAB — DIFFERENTIAL
BASOS ABS: 0 10*3/uL (ref 0.0–0.1)
BASOS PCT: 1 %
Eosinophils Absolute: 0.1 10*3/uL (ref 0.0–0.7)
Eosinophils Relative: 1 %
LYMPHS PCT: 19 %
Lymphs Abs: 1.6 10*3/uL (ref 0.7–4.0)
MONOS PCT: 9 %
Monocytes Absolute: 0.7 10*3/uL (ref 0.1–1.0)
NEUTROS ABS: 6.1 10*3/uL (ref 1.7–7.7)
Neutrophils Relative %: 70 %

## 2016-07-10 LAB — COMPREHENSIVE METABOLIC PANEL
ALK PHOS: 96 U/L (ref 38–126)
ALT: 12 U/L — AB (ref 14–54)
AST: 24 U/L (ref 15–41)
Albumin: 3.4 g/dL — ABNORMAL LOW (ref 3.5–5.0)
Anion gap: 11 (ref 5–15)
BUN: 31 mg/dL — AB (ref 6–20)
CALCIUM: 8.6 mg/dL — AB (ref 8.9–10.3)
CHLORIDE: 103 mmol/L (ref 101–111)
CO2: 21 mmol/L — ABNORMAL LOW (ref 22–32)
CREATININE: 1.25 mg/dL — AB (ref 0.44–1.00)
GFR calc Af Amer: 42 mL/min — ABNORMAL LOW (ref >60)
GFR calc non Af Amer: 36 mL/min — ABNORMAL LOW (ref >60)
Glucose, Bld: 99 mg/dL (ref 65–99)
Potassium: 3.9 mmol/L (ref 3.5–5.1)
SODIUM: 135 mmol/L (ref 135–145)
Total Bilirubin: 0.3 mg/dL (ref 0.3–1.2)
Total Protein: 6.3 g/dL — ABNORMAL LOW (ref 6.5–8.1)

## 2016-07-10 LAB — I-STAT CHEM 8, ED
BUN: 32 mg/dL — ABNORMAL HIGH (ref 6–20)
Calcium, Ion: 1.16 mmol/L (ref 1.15–1.40)
Chloride: 103 mmol/L (ref 101–111)
Creatinine, Ser: 1.4 mg/dL — ABNORMAL HIGH (ref 0.44–1.00)
GLUCOSE: 97 mg/dL (ref 65–99)
HCT: 36 % (ref 36.0–46.0)
Hemoglobin: 12.2 g/dL (ref 12.0–15.0)
POTASSIUM: 4.1 mmol/L (ref 3.5–5.1)
Sodium: 137 mmol/L (ref 135–145)
TCO2: 22 mmol/L (ref 0–100)

## 2016-07-10 LAB — MRSA PCR SCREENING: MRSA BY PCR: NEGATIVE

## 2016-07-10 LAB — PROTIME-INR
INR: 1
Prothrombin Time: 13.2 seconds (ref 11.4–15.2)

## 2016-07-10 LAB — I-STAT TROPONIN, ED: Troponin i, poc: 0.08 ng/mL (ref 0.00–0.08)

## 2016-07-10 LAB — CBC
HEMATOCRIT: 36.5 % (ref 36.0–46.0)
Hemoglobin: 12.6 g/dL (ref 12.0–15.0)
MCH: 29.9 pg (ref 26.0–34.0)
MCHC: 34.5 g/dL (ref 30.0–36.0)
MCV: 86.7 fL (ref 78.0–100.0)
PLATELETS: 253 10*3/uL (ref 150–400)
RBC: 4.21 MIL/uL (ref 3.87–5.11)
RDW: 14.4 % (ref 11.5–15.5)
WBC: 8.6 10*3/uL (ref 4.0–10.5)

## 2016-07-10 LAB — APTT: APTT: 28 s (ref 24–36)

## 2016-07-10 LAB — GLUCOSE, CAPILLARY: Glucose-Capillary: 96 mg/dL (ref 65–99)

## 2016-07-10 MED ORDER — ACETAMINOPHEN 325 MG PO TABS: 650.0000 mg | ORAL_TABLET | ORAL | Status: DC | PRN

## 2016-07-10 MED ORDER — SODIUM CHLORIDE 0.9 % IV SOLN
INTRAVENOUS | Status: DC
  Administered 2016-07-10: 1000 mL via INTRAVENOUS
  Administered 2016-07-10: 10:00:00 via INTRAVENOUS

## 2016-07-10 MED ORDER — LORAZEPAM 2 MG/ML IJ SOLN
1.0000 mg | INTRAMUSCULAR | Status: DC | PRN
Start: 2016-07-10 — End: 2016-07-11
  Administered 2016-07-10: 1 mg via INTRAVENOUS
  Filled 2016-07-10: qty 1

## 2016-07-10 MED ORDER — MORPHINE SULFATE (PF) 4 MG/ML IV SOLN
1.0000 mg | INTRAVENOUS | Status: DC | PRN
  Administered 2016-07-10 – 2016-07-11 (×2): 1 mg via INTRAVENOUS
  Filled 2016-07-10 (×2): qty 1

## 2016-07-10 MED ORDER — ENOXAPARIN SODIUM 30 MG/0.3ML ~~LOC~~ SOLN
30.0000 mg | Freq: Every day | SUBCUTANEOUS | Status: DC
  Administered 2016-07-10 – 2016-07-11 (×2): 30 mg via SUBCUTANEOUS
  Filled 2016-07-10 (×3): qty 0.3

## 2016-07-10 MED ORDER — ACETAMINOPHEN 160 MG/5ML PO SOLN: 650.0000 mg | ORAL | Status: DC | PRN

## 2016-07-10 MED ORDER — ACETAMINOPHEN 650 MG RE SUPP: 650.0000 mg | RECTAL | Status: DC | PRN

## 2016-07-10 NOTE — ED Provider Notes (Signed)
Robin Ferguson DEPT Provider Note   CSN: [id number] Arrival date & time: 07/10/16  [id number]  By signing my name below, I, Robin Ferguson, attest that this documentation has been prepared under the direction and in the presence of Robin Greek, MD.  Electronically Signed: Julien Ferguson, ED Scribe. 07/10/16. 2:32 AM.    History   Chief Complaint Chief Complaint  Patient presents with  . Code Stroke   LEVEL V CAVEAT: HPI and ROS limited due to altered mental status   The history is provided by the EMS personnel. No language interpreter was used.   HPI Comments: Robin Ferguson is a 83 y.o. female brought in by ambulance, who is non-verbal and has a PMhx of right shoulder cancer, HLD, HTN, hypothyroidism, and CVA presents to the Emergency Department presenting as a code stroke. Pt's last seen normal was ~ 7 pm last night. There is limited information on the exact history, but EMS notes pt was found by nursing staff with left sided arm weakness and left sided facial droop PTA. Pt also has rightward gaze.   Past Medical History:  Diagnosis Date  . Allergy    rhinitis  . Asthma   . Cancer (Robin Ferguson)    skin- right shoulder-Squamous cell-legs  . Diverticulosis   . DIVERTICULOSIS OF COLON 01/11/2008   Qualifier: Diagnosis of  By: [name], [name]   . Esophageal stricture   . Hemorrhoids   . Hx of adenomatous colonic polyps   . Hyperlipidemia   . Hypertension   . Hypothyroidism   . Osteopenia   . Personal history of colonic polyps 01/11/2008   Qualifier: Diagnosis of  By: [name], [name]   . PMB (postmenopausal bleeding)   . Stroke ([place])   . Tachycardia     Patient Active Problem List   Diagnosis Date Noted  . Cerebral embolism with cerebral infarction 07/10/2016  . Finger pain, right   . E. coli UTI (urinary tract infection)   . Closed nondisplaced intertrochanteric fracture of left femur ([place]) 05/05/2016  . Depression 10/29/2014  . Allergic rhinitis 10/29/2014  .  GERD (gastroesophageal reflux disease) 10/29/2014  . Do not resuscitate/ Do Not Intubate. Care Goals.  10/29/2014  . Carotid stenosis 05/13/2013  . TIA (transient ischemic attack) 04/28/2013  . Senile dementia uncomp, without behavioral disturbance 04/28/2013  . Osteopenia   . Left bundle branch block 04/23/2007  . Hyperlipidemia 04/11/2007  . Hypothyroidism 01/16/2007  . Essential hypertension 01/16/2007  . SKIN CANCER, HX OF 01/16/2007    Past Surgical History:  Procedure Laterality Date  . CATARACT EXTRACTION    . DILATION AND CURETTAGE OF UTERUS    . FEMUR IM NAIL Left 05/05/2016   Procedure: INTRAMEDULLARY (IM) NAIL FEMORAL;  Surgeon: Tania Ade, MD;  Location: [facility];  Service: Orthopedics;  Laterality: Left;  . HYSTEROSCOPY    . NASAL SINUS SURGERY    . SHOULDER SURGERY    . TUBAL LIGATION  1959    OB History    Gravida Para Term Preterm AB Living   4 4 4     4    SAB TAB Ectopic Multiple Live Births                   Home Medications    Prior to Admission medications   Medication Sig Start Date End Date Taking? Authorizing Provider  escitalopram (LEXAPRO) 20 MG tablet Take 1 tablet (20 mg total) by mouth daily. 02/19/16   Brayton Mars  [facility], MD  fluticasone (FLONASE) 50 MCG/ACT nasal spray Place 2 sprays into both nostrils daily. Patient not taking: Reported on 05/05/2016 07/20/15   Marin Olp, MD  furosemide (LASIX) 20 MG tablet TAKE ONE TABLET BY MOUTH ONCE DAILY. 07/21/15   Marin Olp, MD  HYDROcodone-acetaminophen (NORCO/VICODIN) 5-325 MG tablet Take 1-2 tablets by mouth every 6 (six) hours as needed for moderate pain. 05/09/16   Grier Mitts, PA-C  levothyroxine (SYNTHROID, LEVOTHROID) 100 MCG tablet TAKE ONE TABLET BY MOUTH ONCE DAILY 07/20/15   Marin Olp, MD  lisinopril (PRINIVIL,ZESTRIL) 20 MG tablet TAKE ONE TABLET BY MOUTH ONCE DAILY 07/20/15   Marin Olp, MD  loratadine (ALLERGY RELIEF) 10 MG tablet Take 10 mg by mouth daily as  needed for allergies.     Historical Provider, MD  metoprolol (LOPRESSOR) 50 MG tablet TAKE ONE-HALF TABLET BY MOUTH TWICE DAILY 07/20/15   Marin Olp, MD  RABEprazole (ACIPHEX) 20 MG tablet TAKE ONE TABLET BY MOUTH ONCE DAILY 07/20/15   Marin Olp, MD    Family History Family History  Problem Relation Age of Onset  . Cancer Mother     bone  . Heart disease Brother   . Diabetes Brother   . Heart disease Brother   . Diabetes Brother     Social History Social History  Substance Use Topics  . Smoking status: Former Research scientist (life sciences)  . Smokeless tobacco: Never Used  . Alcohol use No     Comment: rare     Allergies   Procaine hcl   Review of Systems Review of Systems  LEVEL V CAVEAT: HPI and ROS limited due to altered mental status   Physical Exam Updated Vital Signs BP (!) 152/110 (BP Location: Right Arm)   Pulse 79   Resp 26   Wt 157 lb 4.8 oz (71.4 kg)   SpO2 100%   BMI 28.77 kg/m   Physical Exam  Constitutional: She is oriented to person, place, and time. She appears well-developed and well-nourished. No distress.  HENT:  Head: Normocephalic and atraumatic.  Right Ear: Hearing normal.  Left Ear: Hearing normal.  Nose: Nose normal.  Mouth/Throat: Oropharynx is clear and moist and mucous membranes are normal.  Eyes: Conjunctivae are normal. Pupils are equal, round, and reactive to light.  Rightward gaze  Neck: Normal range of motion. Neck supple.  Cardiovascular: Regular rhythm, S1 normal and S2 normal.  Exam reveals no gallop and no friction rub.   No murmur heard. Pulmonary/Chest: Effort normal and breath sounds normal. No respiratory distress. She exhibits no tenderness.  Abdominal: Soft. Normal appearance and bowel sounds are normal. There is no hepatosplenomegaly. There is no tenderness. There is no rebound, no guarding, no tenderness at McBurney's point and negative Murphy's sign. No hernia.  Musculoskeletal: Normal range of motion.  Neurological: She  is alert and oriented to person, place, and time. She has normal strength. No cranial nerve deficit or sensory deficit. Coordination normal. GCS eye subscore is 4. GCS verbal subscore is 5. GCS motor subscore is 6.  Skin: Skin is warm, dry and intact. No rash noted. No cyanosis.  Psychiatric: She has a normal mood and affect. Her behavior is normal. Thought content normal. She is noncommunicative.  Nursing note and vitals reviewed.    ED Treatments / Results  COORDINATION OF CARE:  2:28 AM Discussed treatment plan with pt at bedside and pt agreed to plan.  Labs (all labs ordered are listed, but only abnormal  results are displayed) Labs Reviewed  COMPREHENSIVE METABOLIC PANEL - Abnormal; Notable for the following:       Result Value   CO2 21 (*)    BUN 31 (*)    Creatinine, Ser 1.25 (*)    Calcium 8.6 (*)    Total Protein 6.3 (*)    Albumin 3.4 (*)    ALT 12 (*)    GFR calc non Af Amer 36 (*)    GFR calc Af Amer 42 (*)    All other components within normal limits  I-STAT CHEM 8, ED - Abnormal; Notable for the following:    BUN 32 (*)    Creatinine, Ser 1.40 (*)    All other components within normal limits  PROTIME-INR  APTT  CBC  DIFFERENTIAL  I-STAT TROPOININ, ED  CBG MONITORING, ED    EKG  EKG Interpretation None       Radiology Ct Head Code Stroke W/o Cm  Addendum Date: 07/10/2016   ADDENDUM REPORT: 07/10/2016 02:55 ADDENDUM: These results were called by telephone at the time of interpretation on 07/10/2016 at 2:55 am to Dr. Cheral Marker, who verbally acknowledged these results. Electronically Signed   By: Kristine Garbe M.D.   On: 07/10/2016 02:55   Result Date: 07/10/2016 CLINICAL DATA:  Code stroke.  83 y/o  F; left-sided weakness. EXAM: CT HEAD WITHOUT CONTRAST TECHNIQUE: Contiguous axial images were obtained from the base of the skull through the vertex without intravenous contrast. COMPARISON:  05/05/2016 CT head FINDINGS: Brain: Loss of the right insular  ribbon and subtle hypoattenuation with loss of gray-white differentiation in the perisylvian right frontal lobe. No intracranial hemorrhage identified. Background of advanced chronic microvascular ischemic changes and mild parenchymal volume loss. Vascular: Hyperdense right MCA distally. Skull: Normal. Negative for fracture or focal lesion. Sinuses/Orbits: Mucous retention cysts with calcifications in the right maxillary sinus. Postsurgical changes related to ethmoidectomy and maxillary sinus antrostomy. Bilateral intra-ocular lens replacement. Normal pneumatization of mastoid air cells. Other: None. ASPECTS [facility] Stroke Program Early CT Score) - Ganglionic level infarction (caudate, lentiform nuclei, internal capsule, insula, M1-M3 cortex): 6 - Supraganglionic infarction (M4-M6 cortex): 1 Total score (0-10 with 10 being normal): 7 IMPRESSION: 1. Hypoattenuation with loss of gray-white differentiation and right frontal operculum and the insula. No hemorrhage identified. 2. Hyperdense distal right MCA. 3. ASPECTS is 7. Electronically Signed: By: Kristine Garbe M.D. On: 07/10/2016 02:44    Procedures Procedures (including critical care time)  Medications Ordered in ED Medications - No data to display   Initial Impression / Assessment and Plan / ED Course  I have reviewed the triage vital signs and the nursing notes.  Pertinent labs & imaging results that were available during my care of the patient were reviewed by me and considered in my medical decision making (see chart for details).  Clinical Course    Patient presented From nursing home for evaluation of possible stroke. Last known normal was 7 PM. At some point during the night, however, patient was noted to have left-sided paralysis. EMS brings the patient to the ER. She is not communicative or following any commands at arrival. She has left facial droop with left sided hemiparesis. Patient with persistent right-sided gaze, unable  to track to the left. CODE STATUS was confirmed as DO NOT RESUSCITATE.  Screening CT shows hyperdense right MCA. Family wishes for patient to be kept comfortable, no other significant interventions. Will require admission for further management.  Final Clinical Impressions(s) / ED Diagnoses  Final diagnoses:  Acute ischemic stroke [facility])   I personally performed the services described in this documentation, which was scribed in my presence. The recorded information has been reviewed and is accurate.  New Prescriptions New Prescriptions   No medications on file     Robin Greek, MD 07/10/16 [phone]

## 2016-07-10 NOTE — ED Notes (Signed)
Report to [facility]

## 2016-07-10 NOTE — ED Notes (Signed)
Cannot safely perform stroke swallow

## 2016-07-10 NOTE — Progress Notes (Signed)
Pt admitted after midnight, please see earlier admission note by Dr. Tamala Julian. Pt admitted with acute right MCA stroke. Family in room this AM. We have discussed [place], current medical diagnoses, family very clear that pt prefers comfort care. PCT consulted for assistance. May attempt SLP if pt more alert.   Faye Ramsay, MD  [facility] Pager [phone] Cell [phone]  If 7PM-7AM, please contact night-coverage [email or web address] Password [email or web address]

## 2016-07-10 NOTE — Consult Note (Signed)
Referring Physician: Dr. Betsey Holiday    Chief Complaint: Acute onset of left sided weakness and facial droop  HPI: Robin Ferguson is an 83 y.o. female who presents from her SNF with acute left hemiparesis, left facial droop, rightward gaze deviation and aphasia. She was LKN at 1900 before going to bed. The nursing staff found her later with above symptoms. She has a PMHx of dementia, right shoulder cancer, HLD, HTN, hypothyroidism and prior CVA.   LSN: 1900 tPA Given: No: Out of IV tPA and conventional 6 hour endovascular time windows. Family declined further aggressive intervention including CT perfusion study for possible endovascular procedure within the 24 hour perfusion-imaging based time window.  NIHSS: 24  Past Medical History:  Diagnosis Date  . Allergy    rhinitis  . Asthma   . Cancer ([place])    skin- right shoulder-Squamous cell-legs  . Diverticulosis   . DIVERTICULOSIS OF COLON 01/11/2008   Qualifier: Diagnosis of  By: [name], [name]   . Esophageal stricture   . Hemorrhoids   . Hx of adenomatous colonic polyps   . Hyperlipidemia   . Hypertension   . Hypothyroidism   . Osteopenia   . Personal history of colonic polyps 01/11/2008   Qualifier: Diagnosis of  By: [name], [name]   . PMB (postmenopausal bleeding)   . Stroke ([place])   . Tachycardia     Past Surgical History:  Procedure Laterality Date  . CATARACT EXTRACTION    . DILATION AND CURETTAGE OF UTERUS    . FEMUR IM NAIL Left 05/05/2016   Procedure: INTRAMEDULLARY (IM) NAIL FEMORAL;  Surgeon: Tania Ade, MD;  Location: [facility];  Service: Orthopedics;  Laterality: Left;  . HYSTEROSCOPY    . NASAL SINUS SURGERY    . SHOULDER SURGERY    . TUBAL LIGATION  1959    Family History  Problem Relation Age of Onset  . Cancer Mother     bone  . Heart disease Brother   . Diabetes Brother   . Heart disease Brother   . Diabetes Brother    Social History:  reports that she has quit smoking. She has never used  smokeless tobacco. She reports that she does not drink alcohol or use drugs.  Allergies:  Allergies  Allergen Reactions  . Procaine Hcl     REACTION: heart palpitation    Medications: Prior to Admission:  escitalopram (LEXAPRO) 20 MG tablet Take 1 tablet (20 mg total) by mouth daily. Marin Olp, MD Needs Review  fluticasone (FLONASE) 50 MCG/ACT nasal spray Place 2 sprays into both nostrils daily. Marin Olp, MD Needs Review   Patient not taking: Reported on 05/05/2016    furosemide (LASIX) 20 MG tablet TAKE ONE TABLET BY MOUTH ONCE DAILY. Marin Olp, MD Needs Review  HYDROcodone-acetaminophen (NORCO/VICODIN) 5-325 MG tablet Take 1-2 tablets by mouth every 6 (six) hours as needed for moderate pain. Grier Mitts, PA-C Needs Review  levothyroxine (SYNTHROID, LEVOTHROID) 100 MCG tablet TAKE ONE TABLET BY MOUTH ONCE DAILY Marin Olp, MD Needs Review  lisinopril (PRINIVIL,ZESTRIL) 20 MG tablet TAKE ONE TABLET BY MOUTH ONCE DAILY Marin Olp, MD Needs Review  loratadine (ALLERGY RELIEF) 10 MG tablet Take 10 mg by mouth daily as needed for allergies.  Historical Provider, MD Needs Review  metoprolol (LOPRESSOR) 50 MG tablet TAKE ONE-HALF TABLET BY MOUTH TWICE DAILY Marin Olp, MD Needs Review  RABEprazole (ACIPHEX) 20 MG tablet TAKE ONE TABLET BY MOUTH  ONCE DAILY Marin Olp, MD Needs Review   Home meds also include ASA  ROS: Unable to obtain due to aphasia  Physical Examination: There were no vitals taken for this visit.  General Examination: [place]/AT, no gross wheezing noted, limbs warm and well perfused  Neurologic Examination: Ment: Dense receptive and expressive aphasia. Left hemineglect.  CN: PERRL. Blinks to threat on right, not on left. Rightward gaze deviation cannot be overcome with oculocephalic Insensate left face. Prominent left facial droop. Head rotated to right.  Motor/Sensory:  RUE: Thrashes semipurposefully to noxious at  5/5  RLE: Thrashes semipurposefully to noxious at 5/5  LUE: Insensate and does not move initially. Subsequently moves nonpurposefully   with sternal rub at 4/5. Increased tone.   LLE: Stereotyped withdrawal to noxious.  Reflexes: Decreased on the left.  Cerebellar/Gait: Unable to assess.   No results found for this or any previous visit (from the past 48 hour(s)). No results found.  Assessment: 83 y.o. female with acute onset of left sided weakness and facial droop.  1. CT shows dense right MCA sign and early loss of grey-white differentiation in the insular region with ASPECTS of 7. Clinical and imaging findings consistent with acute right MCA ischemic stroke.  2. Out of IV tPA and conventional 6 hour endovascular time windows. Family declined further aggressive intervention including CT perfusion study for possible endovascular procedure within the 24 hour perfusion-imaging based time window. Discussed with family over the telephone and their decision was witnessed by the patient's RN.  3. Dementia.  Plan: 1. HgbA1c, fasting lipid panel 2. MRI, MRA  of the brain without contrast 3. PT consult, OT consult, Speech consult 4. Echocardiogram 5. Carotid dopplers 6. Switch ASA to Plavix. Classifiable as having failed ASA monotherapy.   7. Risk factor modification 8. Telemetry monitoring 9. Frequent neuro checks. 10. Permissive HTN x 24 hours.  76. Given advanced age, potential statin benefits most likely outweighed by risks.   @Electronically  signed: Dr. Kerney Elbe 07/10/2016, 2:20 AM

## 2016-07-10 NOTE — H&P (Signed)
History and Physical    AIVAH BANICKI [id number] DOB: 03-20-1923 DOA: 07/10/2016  Referring MD/NP/PA: Betsey Holiday PCP: Garret Reddish, MD  Patient coming from: NH  Chief Complaint: Stroke  HPI: MAHOGONY HARRIER is a 83 y.o. female with medical history significant of HTN, HLD, dementia, hypothyroidism, CVA, skin cancer; who presents after being found with left-sided arm weakness and facial droop by nursing home staff. History is obtained via review of records as patient unable to provide her own history. Reportedly last  seen normal around 7 PM last night. Upon EMS arrival patient was noted to also have a rightward gaze.  ED Course: Code stroke was called and the patient was evaluated by neurology. Patient's initial CT scan of the brain revealed hyperdense right MCA stroke. Discussed with patient's son at bedside and daughter (Ms. Thomes Dinning) regarding overall prognosis. At this time family would like to decline further testing.  Review of Systems: Unable to obtain secondary to  to altered mental status.  Past Medical History:  Diagnosis Date  . Allergy    rhinitis  . Asthma   . Cancer ([place])    skin- right shoulder-Squamous cell-legs  . Diverticulosis   . DIVERTICULOSIS OF COLON 01/11/2008   Qualifier: Diagnosis of  By: [name], [name]   . Esophageal stricture   . Hemorrhoids   . Hx of adenomatous colonic polyps   . Hyperlipidemia   . Hypertension   . Hypothyroidism   . Osteopenia   . Personal history of colonic polyps 01/11/2008   Qualifier: Diagnosis of  By: [name], [name]   . PMB (postmenopausal bleeding)   . Stroke ([place])   . Tachycardia     Past Surgical History:  Procedure Laterality Date  . CATARACT EXTRACTION    . DILATION AND CURETTAGE OF UTERUS    . FEMUR IM NAIL Left 05/05/2016   Procedure: INTRAMEDULLARY (IM) NAIL FEMORAL;  Surgeon: Tania Ade, MD;  Location: [facility];  Service: Orthopedics;  Laterality: Left;  . HYSTEROSCOPY    . NASAL SINUS SURGERY     . SHOULDER SURGERY    . TUBAL LIGATION  1959     reports that she has quit smoking. She has never used smokeless tobacco. She reports that she does not drink alcohol or use drugs.  Allergies  Allergen Reactions  . Procaine Hcl     REACTION: heart palpitation    Family History  Problem Relation Age of Onset  . Cancer Mother     bone  . Heart disease Brother   . Diabetes Brother   . Heart disease Brother   . Diabetes Brother     Prior to Admission medications   Medication Sig Start Date End Date Taking? Authorizing Provider  escitalopram (LEXAPRO) 20 MG tablet Take 1 tablet (20 mg total) by mouth daily. 02/19/16   Marin Olp, MD  fluticasone (FLONASE) 50 MCG/ACT nasal spray Place 2 sprays into both nostrils daily. Patient not taking: Reported on 05/05/2016 07/20/15   Marin Olp, MD  furosemide (LASIX) 20 MG tablet TAKE ONE TABLET BY MOUTH ONCE DAILY. 07/21/15   Marin Olp, MD  HYDROcodone-acetaminophen (NORCO/VICODIN) 5-325 MG tablet Take 1-2 tablets by mouth every 6 (six) hours as needed for moderate pain. 05/09/16   Grier Mitts, PA-C  levothyroxine (SYNTHROID, LEVOTHROID) 100 MCG tablet TAKE ONE TABLET BY MOUTH ONCE DAILY 07/20/15   Marin Olp, MD  lisinopril (PRINIVIL,ZESTRIL) 20 MG tablet TAKE ONE TABLET BY MOUTH ONCE DAILY 07/20/15  Marin Olp, MD  loratadine (ALLERGY RELIEF) 10 MG tablet Take 10 mg by mouth daily as needed for allergies.     Historical Provider, MD  metoprolol (LOPRESSOR) 50 MG tablet TAKE ONE-HALF TABLET BY MOUTH TWICE DAILY 07/20/15   Marin Olp, MD  RABEprazole (ACIPHEX) 20 MG tablet TAKE ONE TABLET BY MOUTH ONCE DAILY 07/20/15   Marin Olp, MD    Physical Exam:    Constitutional: Elderly female not able to follow commands  Vitals:   07/10/16 0257 07/10/16 0258  BP: (!) 152/110   Pulse: 79   Resp: 26   SpO2: 100%   Weight:  71.4 kg (157 lb 4.8 oz)   Eyes: PERRL, lids and conjunctivae normal ENMT:  Mucous membranes are dry. Posterior pharynx clear of any exudate.  Neck: normal, supple, no masses, no thyromegaly Respiratory: Tachypneic with coarse  breath sounds Cardiovascular: Regular rate and rhythm, no murmurs / rubs / gallops. No extremity edema. 2+ pedal pulses. No carotid bruits.  Abdomen: no tenderness, no masses palpated. No hepatosplenomegaly. Bowel sounds positive.  Musculoskeletal: no clubbing / cyanosis. Skin: no rashes, lesions, ulcers. No induration Neurologic:  left-sided hemiparesis with left facial droop.  Rightward gaze and dysarthric speech. Psychiatric:  unable to evaluate     Labs on Admission: I have personally reviewed following labs and imaging studies  CBC:  Recent Labs Lab 07/10/16 0219 07/10/16 0246  WBC  --  8.6  NEUTROABS  --  6.1  HGB 12.2 12.6  HCT 36.0 36.5  MCV  --  86.7  PLT  --  123456   Basic Metabolic Panel:  Recent Labs Lab 07/10/16 0219 07/10/16 0246  NA 137 135  K 4.1 3.9  CL 103 103  CO2  --  21*  GLUCOSE 97 99  BUN 32* 31*  CREATININE 1.40* 1.25*  CALCIUM  --  8.6*   GFR: Estimated Creatinine Clearance: 27.1 mL/min (by C-G formula based on SCr of 1.25 mg/dL (H)). Liver Function Tests:  Recent Labs Lab 07/10/16 0246  AST 24  ALT 12*  ALKPHOS 96  BILITOT 0.3  PROT 6.3*  ALBUMIN 3.4*   No results for input(s): LIPASE, AMYLASE in the last 168 hours. No results for input(s): AMMONIA in the last 168 hours. Coagulation Profile:  Recent Labs Lab 07/10/16 0246  INR 1.00   Cardiac Enzymes: No results for input(s): CKTOTAL, CKMB, CKMBINDEX, TROPONINI in the last 168 hours. BNP (last 3 results) No results for input(s): PROBNP in the last 8760 hours. HbA1C: No results for input(s): HGBA1C in the last 72 hours. CBG: No results for input(s): GLUCAP in the last 168 hours. Lipid Profile: No results for input(s): CHOL, HDL, LDLCALC, TRIG, CHOLHDL, LDLDIRECT in the last 72 hours. Thyroid Function Tests: No results  for input(s): TSH, T4TOTAL, FREET4, T3FREE, THYROIDAB in the last 72 hours. Anemia Panel: No results for input(s): VITAMINB12, FOLATE, FERRITIN, TIBC, IRON, RETICCTPCT in the last 72 hours. Urine analysis:    Component Value Date/Time   COLORURINE YELLOW 04/28/2013 2134   APPEARANCEUR CLEAR 04/28/2013 2134   LABSPEC 1.009 04/28/2013 2134   PHURINE 5.5 04/28/2013 2134   GLUCOSEU NEGATIVE 04/28/2013 2134   HGBUR NEGATIVE 04/28/2013 2134   BILIRUBINUR Neg 05/04/2016 1750   KETONESUR 15 (A) 04/28/2013 2134   PROTEINUR Neg 05/04/2016 1750   PROTEINUR NEGATIVE 04/28/2013 2134   UROBILINOGEN negative 05/04/2016 1750   UROBILINOGEN 0.2 04/28/2013 2134   NITRITE Neg 05/04/2016 1750   NITRITE NEGATIVE 04/28/2013  2134   LEUKOCYTESUR small (1+) (A) 05/04/2016 1750   Sepsis Labs: No results found for this or any previous visit (from the past 240 hour(s)).   Radiological Exams on Admission: Ct Head Code Stroke W/o Cm  Addendum Date: 07/10/2016   ADDENDUM REPORT: 07/10/2016 02:55 ADDENDUM: These results were called by telephone at the time of interpretation on 07/10/2016 at 2:55 am to Dr. Cheral Marker, who verbally acknowledged these results. Electronically Signed   By: Kristine Garbe M.D.   On: 07/10/2016 02:55   Result Date: 07/10/2016 CLINICAL DATA:  Code stroke.  83 y/o  F; left-sided weakness. EXAM: CT HEAD WITHOUT CONTRAST TECHNIQUE: Contiguous axial images were obtained from the base of the skull through the vertex without intravenous contrast. COMPARISON:  05/05/2016 CT head FINDINGS: Brain: Loss of the right insular ribbon and subtle hypoattenuation with loss of gray-white differentiation in the perisylvian right frontal lobe. No intracranial hemorrhage identified. Background of advanced chronic microvascular ischemic changes and mild parenchymal volume loss. Vascular: Hyperdense right MCA distally. Skull: Normal. Negative for fracture or focal lesion. Sinuses/Orbits: Mucous retention cysts  with calcifications in the right maxillary sinus. Postsurgical changes related to ethmoidectomy and maxillary sinus antrostomy. Bilateral intra-ocular lens replacement. Normal pneumatization of mastoid air cells. Other: None. ASPECTS [facility] Stroke Program Early CT Score) - Ganglionic level infarction (caudate, lentiform nuclei, internal capsule, insula, M1-M3 cortex): 6 - Supraganglionic infarction (M4-M6 cortex): 1 Total score (0-10 with 10 being normal): 7 IMPRESSION: 1. Hypoattenuation with loss of gray-white differentiation and right frontal operculum and the insula. No hemorrhage identified. 2. Hyperdense distal right MCA. 3. ASPECTS is 7. Electronically Signed: By: Kristine Garbe M.D. On: 07/10/2016 02:44      Assessment/Plan Cerebral embolism with cerebral infarction: Acute. Patient 83 years old with dementia who presented with left-sided weakness and rightward gaze. Noted to have a  right MCA stroke. Discussed with family who agrees that the patient does not need any further extensive workup and would like to just make the patient comfortable at this time. - Admit to MedSurg bed - Nothing by mouth - Elevate head of bed  - Oxygen as needed - Ativan IV prn  Anxiety - morphine IV prn Pain  - Palliative care consult - Social work consult: Question placement in inpatient hospice facility - Rest of family in route  Dementia DO NOT RESUSCITATE  DVT prophylaxis: lovenox  Code Status: DNR Family Communication:   Disposition Plan: TBD  Consults called:  neurology Admission status: observation  Norval Morton MD [facility] Pager [phone]  If 7PM-7AM, please contact night-coverage [email or web address] Password [email or web address]  07/10/2016, 4:50 AM

## 2016-07-10 NOTE — Progress Notes (Signed)
Received from ED via stretcher; patient found incontinent of urine with briefs from nursing home saturated; buttocks reddened; skin care provided; patient moving extremities to stimulation; not to command; eyes remained closed; fall safety measures in place; family at bedside; oriented to room and unit routine.

## 2016-07-10 NOTE — ED Triage Notes (Signed)
Pt arrives to the ED by [facility] for code stroke. Pt was last seen well at nursing home at 1900 on 1/6. Pt has a left sided facial droop and has not been seen to move the left side. Pt not able to communicate or track with eyes. Pt is a DNR. Vitals stable

## 2016-07-11 DIAGNOSIS — Z515 Encounter for palliative care: Secondary | ICD-10-CM

## 2016-07-11 DIAGNOSIS — I63443 Cerebral infarction due to embolism of bilateral cerebellar arteries: Secondary | ICD-10-CM | POA: Diagnosis not present

## 2016-07-11 DIAGNOSIS — I63411 Cerebral infarction due to embolism of right middle cerebral artery: Secondary | ICD-10-CM | POA: Diagnosis not present

## 2016-07-11 DIAGNOSIS — Z7189 Other specified counseling: Secondary | ICD-10-CM

## 2016-07-11 DIAGNOSIS — F0390 Unspecified dementia without behavioral disturbance: Secondary | ICD-10-CM | POA: Diagnosis not present

## 2016-07-11 DIAGNOSIS — I10 Essential (primary) hypertension: Secondary | ICD-10-CM | POA: Diagnosis not present

## 2016-07-11 DIAGNOSIS — Z66 Do not resuscitate: Secondary | ICD-10-CM | POA: Diagnosis not present

## 2016-07-11 DIAGNOSIS — I69954 Hemiplegia and hemiparesis following unspecified cerebrovascular disease affecting left non-dominant side: Secondary | ICD-10-CM | POA: Diagnosis not present

## 2016-07-11 DIAGNOSIS — E039 Hypothyroidism, unspecified: Secondary | ICD-10-CM | POA: Diagnosis not present

## 2016-07-11 DIAGNOSIS — I639 Cerebral infarction, unspecified: Secondary | ICD-10-CM | POA: Diagnosis not present

## 2016-07-11 DIAGNOSIS — R4701 Aphasia: Secondary | ICD-10-CM | POA: Diagnosis not present

## 2016-07-11 DIAGNOSIS — E785 Hyperlipidemia, unspecified: Secondary | ICD-10-CM | POA: Diagnosis not present

## 2016-07-11 DIAGNOSIS — I679 Cerebrovascular disease, unspecified: Secondary | ICD-10-CM | POA: Diagnosis not present

## 2016-07-11 DIAGNOSIS — G309 Alzheimer's disease, unspecified: Secondary | ICD-10-CM | POA: Diagnosis not present

## 2016-07-11 DIAGNOSIS — F339 Major depressive disorder, recurrent, unspecified: Secondary | ICD-10-CM | POA: Diagnosis not present

## 2016-07-11 DIAGNOSIS — R531 Weakness: Secondary | ICD-10-CM | POA: Diagnosis not present

## 2016-07-11 MED ORDER — GLYCOPYRROLATE 0.2 MG/ML IJ SOLN: 0.2000 mg | INTRAMUSCULAR | Status: DC | PRN

## 2016-07-11 NOTE — Progress Notes (Signed)
Patient ID: Robin Ferguson, female   DOB: 1923-05-25, 83 y.o.   MRN: [id number]    PROGRESS NOTE    Robin Ferguson  [id number] DOB: [birth year] DOA: 07/10/2016  PCP: Garret Reddish, MD   Brief Narrative:  83 y.o. female with medical history significant of HTN, HLD, dementia, hypothyroidism, CVA, skin cancer; who presented after being found with left-sided arm weakness and facial droop by nursing home staff.   ED Course: Code stroke was called and the patient was evaluated by neurology. Patient's initial CT scan of the brain revealed hyperdense right MCA stroke.   Assessment & Plan:   Active Problems:   Right MCA stroke - pt difficult to awake this AM - PCT consulted and family made decision to allow full comfort  - plan is to stop blood work and to transfer to residential hospice when bed available     Accelerated HTN - permissive HTN - unable to take anything PO, allowing comfort     Acute kidney injury - Cr trending down since admission - no further blood work to ensure comfort    DVT prophylaxis: D/C Lovenox to ensure comfort  Code Status: DNR Family Communication: Family at bedside  Disposition Plan: residential hospice   Consultants:   PCT  Procedures:   None  Antimicrobials:   None   Subjective: No events overnight.   Objective: Vitals:   07/11/16 0224 07/11/16 0600 07/11/16 0800 07/11/16 0900  BP: 136/66 (!) 169/72  (!) 182/94  Pulse: 73 81  87  Resp: 18 18  20   Temp: 97.7 F (36.5 C) 97.7 F (36.5 C)  97.7 F (36.5 C)  TempSrc: Axillary Axillary  Oral  SpO2: 93% 94%  100%  Weight:   71.2 kg (156 lb 14.4 oz)   Height:   5' (1.524 m)     Intake/Output Summary (Last 24 hours) at 07/11/16 1059 Last data filed at 07/11/16 0200  Gross per 24 hour  Intake          1173.75 ml  Output                0 ml  Net          1173.75 ml   Filed Weights   07/10/16 0258 07/11/16 0800  Weight: 71.4 kg (157 lb 4.8 oz) 71.2 kg (156 lb 14.4 oz)     Examination:  General exam: Appears comfortable, non verbal  Respiratory system: Clear to auscultation. Respiratory effort normal. Cardiovascular system: No JVD, murmurs, rubs, gallops or clicks. No pedal edema. Gastrointestinal system: Abdomen is nondistended, soft and nontender. No organomegaly or masses felt. Normal bowel sounds heard.  Data Reviewed: I have personally reviewed following labs and imaging studies  CBC:  Recent Labs Lab 07/10/16 0219 07/10/16 0246  WBC  --  8.6  NEUTROABS  --  6.1  HGB 12.2 12.6  HCT 36.0 36.5  MCV  --  86.7  PLT  --  123456   Basic Metabolic Panel:  Recent Labs Lab 07/10/16 0219 07/10/16 0246  NA 137 135  K 4.1 3.9  CL 103 103  CO2  --  21*  GLUCOSE 97 99  BUN 32* 31*  CREATININE 1.40* 1.25*  CALCIUM  --  8.6*   Liver Function Tests:  Recent Labs Lab 07/10/16 0246  AST 24  ALT 12*  ALKPHOS 96  BILITOT 0.3  PROT 6.3*  ALBUMIN 3.4*   Coagulation Profile:  Recent Labs Lab 07/10/16 0246  INR 1.00  CBG:  Recent Labs Lab 07/10/16 0912  GLUCAP 96   Urine analysis:    Component Value Date/Time   COLORURINE YELLOW 04/28/2013 2134   APPEARANCEUR CLEAR 04/28/2013 2134   LABSPEC 1.009 04/28/2013 2134   PHURINE 5.5 04/28/2013 2134   GLUCOSEU NEGATIVE 04/28/2013 2134   HGBUR NEGATIVE 04/28/2013 2134   BILIRUBINUR Neg 05/04/2016 1750   KETONESUR 15 (A) 04/28/2013 2134   PROTEINUR Neg 05/04/2016 1750   PROTEINUR NEGATIVE 04/28/2013 2134   UROBILINOGEN negative 05/04/2016 1750   UROBILINOGEN 0.2 04/28/2013 2134   NITRITE Neg 05/04/2016 1750   NITRITE NEGATIVE 04/28/2013 2134   LEUKOCYTESUR small (1+) (A) 05/04/2016 1750    Recent Results (from the past 240 hour(s))  MRSA PCR Screening     Status: None   Collection Time: 07/10/16  8:20 PM  Result Value Ref Range Status   MRSA by PCR NEGATIVE NEGATIVE Final    Comment:        The GeneXpert MRSA Assay (FDA approved for NASAL specimens only), is one  component of a comprehensive MRSA colonization surveillance program. It is not intended to diagnose MRSA infection nor to guide or monitor treatment for MRSA infections.      Radiology Studies: Ct Head Code Stroke W/o Cm  Addendum Date: 07/10/2016   ADDENDUM REPORT: 07/10/2016 02:55 ADDENDUM: These results were called by telephone at the time of interpretation on 07/10/2016 at 2:55 am to Dr. Cheral Marker, who verbally acknowledged these results. Electronically Signed   By: Kristine Garbe M.D.   On: 07/10/2016 02:55   Result Date: 07/10/2016 CLINICAL DATA:  Code stroke.  83 y/o  F; left-sided weakness. EXAM: CT HEAD WITHOUT CONTRAST TECHNIQUE: Contiguous axial images were obtained from the base of the skull through the vertex without intravenous contrast. COMPARISON:  05/05/2016 CT head FINDINGS: Brain: Loss of the right insular ribbon and subtle hypoattenuation with loss of gray-white differentiation in the perisylvian right frontal lobe. No intracranial hemorrhage identified. Background of advanced chronic microvascular ischemic changes and mild parenchymal volume loss. Vascular: Hyperdense right MCA distally. Skull: Normal. Negative for fracture or focal lesion. Sinuses/Orbits: Mucous retention cysts with calcifications in the right maxillary sinus. Postsurgical changes related to ethmoidectomy and maxillary sinus antrostomy. Bilateral intra-ocular lens replacement. Normal pneumatization of mastoid air cells. Other: None. ASPECTS [facility] Stroke Program Early CT Score) - Ganglionic level infarction (caudate, lentiform nuclei, internal capsule, insula, M1-M3 cortex): 6 - Supraganglionic infarction (M4-M6 cortex): 1 Total score (0-10 with 10 being normal): 7 IMPRESSION: 1. Hypoattenuation with loss of gray-white differentiation and right frontal operculum and the insula. No hemorrhage identified. 2. Hyperdense distal right MCA. 3. ASPECTS is 7. Electronically Signed: By: Kristine Garbe M.D.  On: 07/10/2016 02:44    Scheduled Meds: . enoxaparin (LOVENOX) injection  30 mg Subcutaneous Daily   Continuous Infusions: . sodium chloride 1,000 mL (07/10/16 2233)     LOS: 0 days   Time spent: 20 minutes   Faye Ramsay, MD [facility] Pager [phone]  If 7PM-7AM, please contact night-coverage [email or web address] Password [email or web address] 07/11/2016, 10:59 AM

## 2016-07-11 NOTE — Discharge Summary (Signed)
Physician Discharge Summary  Robin Ferguson [id number] DOB: 1923/03/31 DOA: 07/10/2016  PCP: Garret Reddish, MD  Admit date: 07/10/2016 Discharge date: 07/11/2016  Recommendations for Outpatient Follow-up:  Pt will be discharged to [facility]   Discharge Diagnoses:  Active Problems:   Senile dementia uncomp, without behavioral disturbance   Do not resuscitate/ Do Not Intubate. Care Goals.    Cerebral embolism with cerebral infarction  Discharge Condition: Stable  Diet recommendation: comfort feeding   Brief Narrative:  83 y.o.femalewith medical history significant of HTN, HLD, dementia, hypothyroidism, CVA, skin cancer; who presented after being found with left-sided arm weakness and facial droop by nursing home staff.   ED Course:Code stroke was called and the patient was evaluated by neurology. Patient's initial CT scan of the brain revealed hyperdense right MCA stroke.   Assessment & Plan:   Active Problems:   Right MCA stroke - pt difficult to awake this AM - PCT consulted and family made decision to allow full comfort  - plan is to stop blood work and to transfer to residential hospice     Accelerated HTN - permissive HTN - unable to take anything PO, allowing comfort     Acute kidney injury - Cr trending down since admission - no further blood work to ensure comfort    DVT prophylaxis: D/C Lovenox to ensure comfort  Code Status: DNR Family Communication: Family at bedside  Disposition Plan: residential hospice   Consultants:   PCT  Procedures:   None  Antimicrobials:   None  Procedures/Studies: Ct Head Code Stroke W/o Cm  Addendum Date: 07/10/2016   ADDENDUM REPORT: 07/10/2016 02:55 ADDENDUM: These results were called by telephone at the time of interpretation on 07/10/2016 at 2:55 am to Dr. Cheral Marker, who verbally acknowledged these results. Electronically Signed   By: Kristine Garbe M.D.   On: 07/10/2016 02:55   Result Date:  07/10/2016 CLINICAL DATA:  Code stroke.  83 y/o  F; left-sided weakness. EXAM: CT HEAD WITHOUT CONTRAST TECHNIQUE: Contiguous axial images were obtained from the base of the skull through the vertex without intravenous contrast. COMPARISON:  05/05/2016 CT head FINDINGS: Brain: Loss of the right insular ribbon and subtle hypoattenuation with loss of gray-white differentiation in the perisylvian right frontal lobe. No intracranial hemorrhage identified. Background of advanced chronic microvascular ischemic changes and mild parenchymal volume loss. Vascular: Hyperdense right MCA distally. Skull: Normal. Negative for fracture or focal lesion. Sinuses/Orbits: Mucous retention cysts with calcifications in the right maxillary sinus. Postsurgical changes related to ethmoidectomy and maxillary sinus antrostomy. Bilateral intra-ocular lens replacement. Normal pneumatization of mastoid air cells. Other: None. ASPECTS [facility] Stroke Program Early CT Score) - Ganglionic level infarction (caudate, lentiform nuclei, internal capsule, insula, M1-M3 cortex): 6 - Supraganglionic infarction (M4-M6 cortex): 1 Total score (0-10 with 10 being normal): 7 IMPRESSION: 1. Hypoattenuation with loss of gray-white differentiation and right frontal operculum and the insula. No hemorrhage identified. 2. Hyperdense distal right MCA. 3. ASPECTS is 7. Electronically Signed: By: Kristine Garbe M.D. On: 07/10/2016 02:44     Discharge Exam: Vitals:   07/11/16 0600 07/11/16 0900  BP: (!) 169/72 (!) 182/94  Pulse: 81 87  Resp: 18 20  Temp: 97.7 F (36.5 C) 97.7 F (36.5 C)   Vitals:   07/11/16 0224 07/11/16 0600 07/11/16 0800 07/11/16 0900  BP: 136/66 (!) 169/72  (!) 182/94  Pulse: 73 81  87  Resp: 18 18  20   Temp: 97.7 F (36.5 C) 97.7 F (36.5 C)  97.7 F (36.5 C)  TempSrc: Axillary Axillary  Oral  SpO2: 93% 94%  100%  Weight:   71.2 kg (156 lb 14.4 oz)   Height:   5' (1.524 m)     General: Pt is NAD, non verbal,  not responsive   Discharge Instructions  Discharge Instructions    Diet - low sodium heart healthy    Complete by:  As directed    Increase activity slowly    Complete by:  As directed      Allergies as of 07/11/2016      Reactions   Procaine Hcl    REACTION: heart palpitation      Medication List    STOP taking these medications   acetaminophen 500 MG tablet Commonly known as:  TYLENOL   ALLERGY RELIEF 10 MG tablet Generic drug:  loratadine   aspirin 325 MG tablet   divalproex 125 MG capsule Commonly known as:  DEPAKOTE SPRINKLE   escitalopram 20 MG tablet Commonly known as:  LEXAPRO   fluticasone 50 MCG/ACT nasal spray Commonly known as:  FLONASE   furosemide 20 MG tablet Commonly known as:  LASIX   levothyroxine 100 MCG tablet Commonly known as:  SYNTHROID, LEVOTHROID   lisinopril 20 MG tablet Commonly known as:  PRINIVIL,ZESTRIL   loperamide 2 MG capsule Commonly known as:  IMODIUM   metoprolol 50 MG tablet Commonly known as:  LOPRESSOR   NON FORMULARY   polyethylene glycol packet Commonly known as:  MIRALAX / GLYCOLAX   RABEprazole 20 MG tablet Commonly known as:  ACIPHEX     TAKE these medications   LORazepam 0.5 MG tablet Commonly known as:  ATIVAN Take 0.5 mg by mouth every 4 (four) hours as needed for anxiety.   ondansetron 4 MG tablet Commonly known as:  ZOFRAN Take 4 mg by mouth every 6 (six) hours as needed for nausea or vomiting.      Follow-up Information    Garret Reddish, MD .   Specialty:  Family Medicine Contact information: [address] [place] [address] [phone]            The results of significant diagnostics from this hospitalization (including imaging, microbiology, ancillary and laboratory) are listed below for reference.     Microbiology: Recent Results (from the past 240 hour(s))  MRSA PCR Screening     Status: None   Collection Time: 07/10/16  8:20 PM  Result Value Ref Range Status    MRSA by PCR NEGATIVE NEGATIVE Final    Comment:        The GeneXpert MRSA Assay (FDA approved for NASAL specimens only), is one component of a comprehensive MRSA colonization surveillance program. It is not intended to diagnose MRSA infection nor to guide or monitor treatment for MRSA infections.      Labs: Basic Metabolic Panel:  Recent Labs Lab 07/10/16 0219 07/10/16 0246  NA 137 135  K 4.1 3.9  CL 103 103  CO2  --  21*  GLUCOSE 97 99  BUN 32* 31*  CREATININE 1.40* 1.25*  CALCIUM  --  8.6*   Liver Function Tests:  Recent Labs Lab 07/10/16 0246  AST 24  ALT 12*  ALKPHOS 96  BILITOT 0.3  PROT 6.3*  ALBUMIN 3.4*   No results for input(s): LIPASE, AMYLASE in the last 168 hours. No results for input(s): AMMONIA in the last 168 hours. CBC:  Recent Labs Lab 07/10/16 0219 07/10/16 0246  WBC  --  8.6  NEUTROABS  --  6.1  HGB 12.2 12.6  HCT 36.0 36.5  MCV  --  86.7  PLT  --  253    CBG:  Recent Labs Lab 07/10/16 0912  GLUCAP 96   SIGNED: Time coordinating discharge: 30 minutes  Faye Ramsay, MD  [facility] 07/11/2016, 12:59 PM Pager [phone] If 7PM-7AM, please contact night-coverage [email or web address] Password [email or web address]

## 2016-07-11 NOTE — Progress Notes (Addendum)
Patient is discharged from room 5M14 at this time. Alert and in stable condition. IV site d/c'd as well as tele. Medicated with Morphine per PRN order for discomfort. Report given to receiving nurse Doreen Salvage at [facility] with all questions answered. Left unit via stretcher by [facility] with all belongings at side.

## 2016-07-11 NOTE — Clinical Social Work Note (Signed)
CSW consulted for residential hospice placement. CSW met with pt's family, who chose [facility]. [place] is able to accept pt. CSW made referral. Pt is ready for discharge. CSW sent discharge summary to [facility]. RN called report. [facility] provided transportation. CSW is signing off as no further needs identified.   Elspeth Cho, LCSW  Clinical Social Worker  [phone]

## 2016-07-11 NOTE — Care Management Note (Signed)
Case Management Note  Patient Details  Name: Robin Ferguson MRN: [id number] Date of Birth: August 02, 1922  Subjective/Objective:    Pt in with CVA. She is from SNF.                 Action/Plan: Pt discharging to [facility] today. No further needs per CM.   Expected Discharge Date:                  Expected Discharge Plan:  [place]  In-House Referral:     Discharge planning Services     Post Acute Care Choice:    Choice offered to:     DME Arranged:    DME Agency:     HH Arranged:    [place] Agency:     Status of Service:  Completed, signed off  If discussed at [employer] of Stay Meetings, dates discussed:    Additional Comments:  Pollie Friar, RN 07/11/2016, 1:30 PM

## 2016-07-11 NOTE — Progress Notes (Signed)
[facility] Liaison:  RN visit  Received request from [place] for family interest in [facility] with request for transfer today.  Chart reviewed.  Met with patient and family to confirm interest and explain services.  Family agreeable to transfer today.  CSW aware.  Registration paperwork completed.  Dr. Orpah Melter to assume care per family request.  Please fax discharge summary to [phone].  RN please call report to [phone].  Please arrange transport for patient to arrive as soon as possible.   Thank you,  Edyth Gunnels, RN, [facility] Liaison  All hospital liaisons are now on [place].  Please feel free to contact me directly at [phone] or call hospice at [phone] after 5pm.

## 2016-07-11 NOTE — Discharge Instructions (Signed)
Cerebral Arteriosclerosis Introduction Cerebral arteriosclerosis is a condition that affects the arteries in your brain. When you have cerebral arteriosclerosis, these arteries become thicker, harder, and narrower than normal. Once this damage starts, a sticky substance (plaque) can build up inside your arteries and block blood flow. This reduces the normal blood flow to your brain. Blood carries oxygen to your brain. Since blood flow to your brain is reduced, your brain may not get the oxygen it needs. This process may start early in life and build up over time. Blood clots or plaques that rupture and break off may completely block blood flow and cause sudden and serious damage. Cerebral arteriosclerosis can cause serious problems such as stroke or dementia. What increases the risk? Risk factors for developing cerebral arteriosclerosis include:  Being female.  Older age. The likelihood of this disorder increases with age.  Being Asian, African American, or Hispanic.  Having a family history of certain conditions, such as heart disease or stroke.  Having heart disease or a history of stroke. Lifestyle factors that can increase your risk of developing cerebral arteriosclerosis include:  Smoking or being exposed to secondhand smoke.  Having diabetes.  Being overweight.  Not getting enough exercise.  Having high blood pressure (hypertension).  Having high cholesterol.  Having a diet high in fat. What are the signs or symptoms? The most common symptoms of cerebral arteriosclerosis include:  Numbness or weakness in your arms or legs.  Speech problems.  Problems swallowing.  Vision problems.  Confusion.  Irritability.  Personality changes, such as loss of interest, irritability, or confusion (vascular dementia).  Headache or facial pain. How is this diagnosed? Your health care provider may diagnose cerebral arteriosclerosis based on your symptoms and a physical exam. You may  also have imaging studiesof your brain to confirm the diagnosis. These may include:  An imaging test using sound waves (ultrasound).  An imaging test after getting an injection of dye (angiogram). How is this treated? Treatment may include:  Medicine to control conditions that put you at risk of developing cerebral arteriosclerosis. These conditions may include:  High blood pressure.  High cholesterol.  Diabetes.  Taking medicine to thin your blood and prevent stroke.  Surgery. This may be:  Intracranial stent placement. This is a procedure to locate a blocked artery in the brain, widen the artery, and keep the artery open.  Surgery to bypass a blocked artery with a healthy artery taken from another part of the body. Follow these instructions at home:  Take medicine only as directed by your health care provider.  Do not use any tobacco products, including cigarettes, chewing tobacco, or electronic cigarettes. If you need help quitting, ask your health care provider.  Eat a low-fat, low-cholesterol, and low-sodium diet as directed by your health care provider.  Follow an exercise program approved by your health care provider.  Maintain a healthy weight. Lose weight if necessary.  Work with your health care provider to manage other health conditions, such as hypertension or diabetes.  Keep all follow-up visits as directed by your health care provider. This is important. Contact a health care provider if:  You have frequent headaches.  You have dizziness.  You or someone else notices changes in your personality.  You have facial pain.  You have periods of confusion. Get help right away if:  You have a very bad headache.  You have weakness or numbness in your arms or legs.  You have droopiness of your face.  You   have difficulty speaking.  You have difficulty swallowing.  You have trouble understanding what other people are saying.  You have sudden  confusion.  You have sudden vision problems.  You have a seizure or loss of consciousness. Any of these symptoms may represent a serious problem that is an emergency. Do not wait to see if the symptoms will go away. Get medical help right away. Call your local emergency services (911 in [place] Do not drive yourself to the hospital.  This information is not intended to replace advice given to you by your health care provider. Make sure you discuss any questions you have with your health care provider. Document Released: 06/10/2002 Document Revised: 11/26/2015 Document Reviewed: 08/08/2013  2017 Elsevier  

## 2016-07-12 DIAGNOSIS — I10 Essential (primary) hypertension: Secondary | ICD-10-CM | POA: Diagnosis not present

## 2016-07-12 DIAGNOSIS — R4701 Aphasia: Secondary | ICD-10-CM | POA: Diagnosis not present

## 2016-07-12 DIAGNOSIS — E785 Hyperlipidemia, unspecified: Secondary | ICD-10-CM | POA: Diagnosis not present

## 2016-07-12 DIAGNOSIS — I69954 Hemiplegia and hemiparesis following unspecified cerebrovascular disease affecting left non-dominant side: Secondary | ICD-10-CM | POA: Diagnosis not present

## 2016-07-12 DIAGNOSIS — I679 Cerebrovascular disease, unspecified: Secondary | ICD-10-CM | POA: Diagnosis not present

## 2016-07-12 DIAGNOSIS — F339 Major depressive disorder, recurrent, unspecified: Secondary | ICD-10-CM | POA: Diagnosis not present

## 2016-07-12 NOTE — Consult Note (Signed)
                                                                                 Consultation Note Date: 07/12/2016   Patient Name: Robin Ferguson  DOB: 03/15/1923  MRN: [id number]  Age / Sex: 83 y.o., female  PCP: Stephen O Hunter, MD Referring Physician: No att. providers found  Reason for Consultation: Terminal Care  HPI/Patient Profile: 83 y.o.femalewith medical history significant of HTN, HLD, dementia, hypothyroidism, CVA, skin cancer; who presented after being found with left-sided arm weakness and facial droop by nursing home staff.   Found to have MCA stroke.  Palliative consulted for GOC related to this.  Clinical Assessment and Goals of Care: Met with her four children.  They reports that she has always been an independent woman and are clear that her goals would be for comfort in this situation.  We reviewed her clinical course and possible pathways forward.  We reviewed comfort care, nutrition/hydration at the end of life, and medication for symptom management.  We discussed options for discharge from the hospital, including residential hospice, hospice in the home, and hospice at long term care facility.  We discussed her prognosis (days to <2 weeks)   SUMMARY OF RECOMMENDATIONS   - Comfort care - Pursue placement at [facility] for end of life care  Code Status/Advance Care Planning:  DNR   Symptom Management:   Pain/SOB: morphine as needed  Anxiety: ativan as needed  Agitation: Haldol as needed  Secretions: Robinul as needed  Palliative Prophylaxis:   Frequent Pain Assessment  Additional Recommendations (Limitations, Scope, Preferences):  Full Comfort Care  Psycho-social/Spiritual:   Desire for further Chaplaincy support:no  Additional Recommendations: Education on Hospice  Prognosis:   < 2 weeks  Discharge Planning: Hospice facility      Primary Diagnoses: Present on Admission: . Cerebral embolism with cerebral infarction . Do  not resuscitate/ Do Not Intubate. Care Goals.  . Senile dementia uncomp, without behavioral disturbance   I have reviewed the medical record, interviewed the patient and family, and examined the patient. The following aspects are pertinent.  Past Medical History:  Diagnosis Date  . Allergy    rhinitis  . Asthma   . Cancer (HCC)    skin- right shoulder-Squamous cell-legs  . Diverticulosis   . DIVERTICULOSIS OF COLON 01/11/2008   Qualifier: Diagnosis of  By: Kaplan MD, Robert D   . Esophageal stricture   . Hemorrhoids   . Hx of adenomatous colonic polyps   . Hyperlipidemia   . Hypertension   . Hypothyroidism   . Osteopenia   . Personal history of colonic polyps 01/11/2008   Qualifier: Diagnosis of  By: Kaplan MD, Robert D   . PMB (postmenopausal bleeding)   . Stroke (HCC)   . Tachycardia    Social History   Social History  . Marital status: Widowed    Spouse name: N/A  . Number of children: N/A  . Years of education: N/A   Social History Main Topics  . Smoking status: Former Smoker  . Smokeless tobacco: Never Used  . Alcohol use No     Comment: rare  . Drug   use: No  . Sexual activity: No   Other Topics Concern  . None   Social History Narrative   Lives at [facility] with a female partner (together 4 years), he is very helpful in continuing independent living. Husband passed 1989. 4 kids. 5 grandkids. 2 greatgrandkids.       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       Daughter Kathy Hilton and Laura Moore typically make decisions together though not clear if has HCPOA. DNR/DNI confirmed 10/29/14.    Family History  Problem Relation Age of Onset  . Cancer Mother     bone  . Heart disease Brother   . Diabetes Brother   . Heart disease Brother   . Diabetes Brother    Scheduled Meds: Continuous Infusions: PRN Meds:. Medications Prior to Admission:  Prior to Admission medications   Medication Sig Start Date End Date Taking? Authorizing Provider  LORazepam  (ATIVAN) 0.5 MG tablet Take 0.5 mg by mouth every 4 (four) hours as needed for anxiety.   Yes Historical Provider, MD  ondansetron (ZOFRAN) 4 MG tablet Take 4 mg by mouth every 6 (six) hours as needed for nausea or vomiting.   Yes Historical Provider, MD   Allergies  Allergen Reactions  . Procaine Hcl     REACTION: heart palpitation   Review of Systems  Unable to assess  Physical Exam  ral: Somnolent.  Does not arouse HEENT: No bruits, no goiter, no JVD Heart: Regular rate and rhythm. No murmur appreciated. Lungs: Diminished air movement, clear Abdomen: Soft, nontender, nondistended, positive bowel sounds.  Ext: No significant edema Skin: Warm and dry Neuro: Unresponsive   Vital Signs: BP (!) 156/64 (BP Location: Right Arm)   Pulse 89   Temp 98.3 F (36.8 C) (Axillary)   Resp 19   Ht 5' (1.524 m)   Wt 71.2 kg (156 lb 14.4 oz)   SpO2 98%   BMI 30.64 kg/m  Pain Assessment: PAINAD       SpO2: SpO2: 98 % O2 Device:SpO2: 98 % O2 Flow Rate: .O2 Flow Rate (L/min): 2 L/min  IO: Intake/output summary: No intake or output data in the 24 hours ending 07/12/16 1039  LBM: Last BM Date: 07/10/16 Baseline Weight: Weight: 71.4 kg (157 lb 4.8 oz) Most recent weight: Weight: 71.2 kg (156 lb 14.4 oz)     Palliative Assessment/Data:     Time In: 0940 Time Out: 1040 Time Total: 60 Greater than 50%  of this time was spent counseling and coordinating care related to the above assessment and plan.  Signed by:  , MD   Please contact Palliative Medicine Team phone at [phone] for questions and concerns.  For individual provider: See Amion             

## 2016-07-13 DIAGNOSIS — I679 Cerebrovascular disease, unspecified: Secondary | ICD-10-CM | POA: Diagnosis not present

## 2016-07-13 DIAGNOSIS — I10 Essential (primary) hypertension: Secondary | ICD-10-CM | POA: Diagnosis not present

## 2016-07-13 DIAGNOSIS — I69954 Hemiplegia and hemiparesis following unspecified cerebrovascular disease affecting left non-dominant side: Secondary | ICD-10-CM | POA: Diagnosis not present

## 2016-07-13 DIAGNOSIS — E785 Hyperlipidemia, unspecified: Secondary | ICD-10-CM | POA: Diagnosis not present

## 2016-07-13 DIAGNOSIS — F339 Major depressive disorder, recurrent, unspecified: Secondary | ICD-10-CM | POA: Diagnosis not present

## 2016-07-13 DIAGNOSIS — R4701 Aphasia: Secondary | ICD-10-CM | POA: Diagnosis not present

## 2016-07-14 DIAGNOSIS — I69954 Hemiplegia and hemiparesis following unspecified cerebrovascular disease affecting left non-dominant side: Secondary | ICD-10-CM | POA: Diagnosis not present

## 2016-07-14 DIAGNOSIS — I679 Cerebrovascular disease, unspecified: Secondary | ICD-10-CM | POA: Diagnosis not present

## 2016-07-14 DIAGNOSIS — E785 Hyperlipidemia, unspecified: Secondary | ICD-10-CM | POA: Diagnosis not present

## 2016-07-14 DIAGNOSIS — F339 Major depressive disorder, recurrent, unspecified: Secondary | ICD-10-CM | POA: Diagnosis not present

## 2016-07-14 DIAGNOSIS — R4701 Aphasia: Secondary | ICD-10-CM | POA: Diagnosis not present

## 2016-07-14 DIAGNOSIS — I10 Essential (primary) hypertension: Secondary | ICD-10-CM | POA: Diagnosis not present

## 2016-07-15 DIAGNOSIS — I69954 Hemiplegia and hemiparesis following unspecified cerebrovascular disease affecting left non-dominant side: Secondary | ICD-10-CM | POA: Diagnosis not present

## 2016-07-15 DIAGNOSIS — R4701 Aphasia: Secondary | ICD-10-CM | POA: Diagnosis not present

## 2016-07-15 DIAGNOSIS — E785 Hyperlipidemia, unspecified: Secondary | ICD-10-CM | POA: Diagnosis not present

## 2016-07-15 DIAGNOSIS — I679 Cerebrovascular disease, unspecified: Secondary | ICD-10-CM | POA: Diagnosis not present

## 2016-07-15 DIAGNOSIS — F339 Major depressive disorder, recurrent, unspecified: Secondary | ICD-10-CM | POA: Diagnosis not present

## 2016-07-15 DIAGNOSIS — I10 Essential (primary) hypertension: Secondary | ICD-10-CM | POA: Diagnosis not present

## 2016-07-16 DIAGNOSIS — F339 Major depressive disorder, recurrent, unspecified: Secondary | ICD-10-CM | POA: Diagnosis not present

## 2016-07-16 DIAGNOSIS — I10 Essential (primary) hypertension: Secondary | ICD-10-CM | POA: Diagnosis not present

## 2016-07-16 DIAGNOSIS — E785 Hyperlipidemia, unspecified: Secondary | ICD-10-CM | POA: Diagnosis not present

## 2016-07-16 DIAGNOSIS — I679 Cerebrovascular disease, unspecified: Secondary | ICD-10-CM | POA: Diagnosis not present

## 2016-07-16 DIAGNOSIS — R4701 Aphasia: Secondary | ICD-10-CM | POA: Diagnosis not present

## 2016-07-16 DIAGNOSIS — I69954 Hemiplegia and hemiparesis following unspecified cerebrovascular disease affecting left non-dominant side: Secondary | ICD-10-CM | POA: Diagnosis not present

## 2016-07-17 DIAGNOSIS — I679 Cerebrovascular disease, unspecified: Secondary | ICD-10-CM | POA: Diagnosis not present

## 2016-07-17 DIAGNOSIS — R4701 Aphasia: Secondary | ICD-10-CM | POA: Diagnosis not present

## 2016-07-17 DIAGNOSIS — E785 Hyperlipidemia, unspecified: Secondary | ICD-10-CM | POA: Diagnosis not present

## 2016-07-17 DIAGNOSIS — I69954 Hemiplegia and hemiparesis following unspecified cerebrovascular disease affecting left non-dominant side: Secondary | ICD-10-CM | POA: Diagnosis not present

## 2016-07-17 DIAGNOSIS — I10 Essential (primary) hypertension: Secondary | ICD-10-CM | POA: Diagnosis not present

## 2016-07-17 DIAGNOSIS — F339 Major depressive disorder, recurrent, unspecified: Secondary | ICD-10-CM | POA: Diagnosis not present

## 2016-08-03 DIAGNOSIS — J309 Allergic rhinitis, unspecified: Secondary | ICD-10-CM | POA: Diagnosis not present

## 2016-08-03 DIAGNOSIS — I10 Essential (primary) hypertension: Secondary | ICD-10-CM | POA: Diagnosis not present

## 2016-08-03 DIAGNOSIS — M6281 Muscle weakness (generalized): Secondary | ICD-10-CM | POA: Diagnosis not present

## 2016-08-03 DIAGNOSIS — F0391 Unspecified dementia with behavioral disturbance: Secondary | ICD-10-CM | POA: Diagnosis not present

## 2016-08-03 DIAGNOSIS — F419 Anxiety disorder, unspecified: Secondary | ICD-10-CM | POA: Diagnosis not present

## 2016-08-03 DIAGNOSIS — K5900 Constipation, unspecified: Secondary | ICD-10-CM | POA: Diagnosis not present

## 2016-08-03 DIAGNOSIS — E039 Hypothyroidism, unspecified: Secondary | ICD-10-CM | POA: Diagnosis not present

## 2016-08-03 DIAGNOSIS — K219 Gastro-esophageal reflux disease without esophagitis: Secondary | ICD-10-CM | POA: Diagnosis not present

## 2016-08-04 DEATH — deceased

## 2017-04-22 IMAGING — DX DG HAND 2V*R*
2 series · 2 of 2 positions shown · non-contrast
Comparison: None.

CLINICAL DATA: Un witnessed fall with hand pain, initial encounter

EXAM:
RIGHT HAND - 2 VIEW

[x hand pa right]
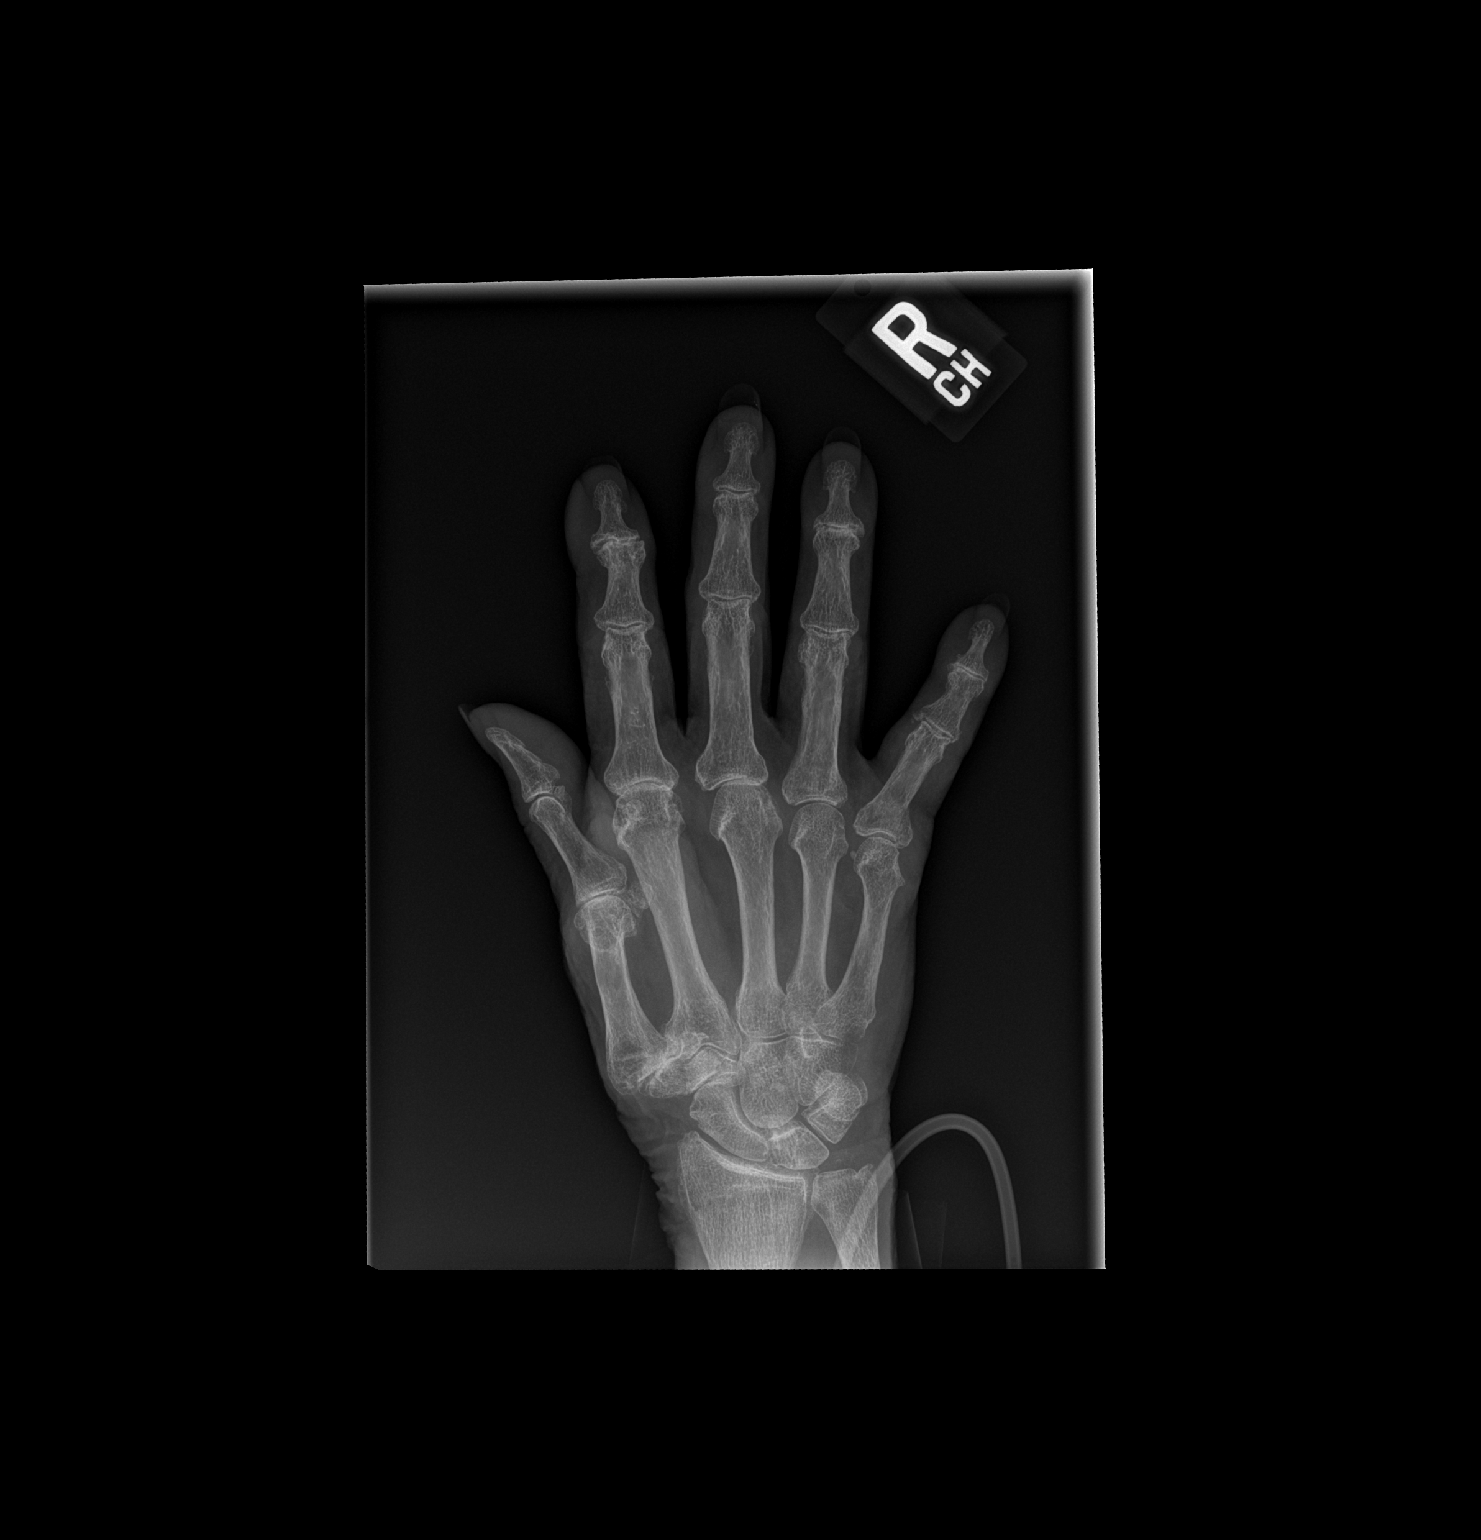

[x hand lat right]
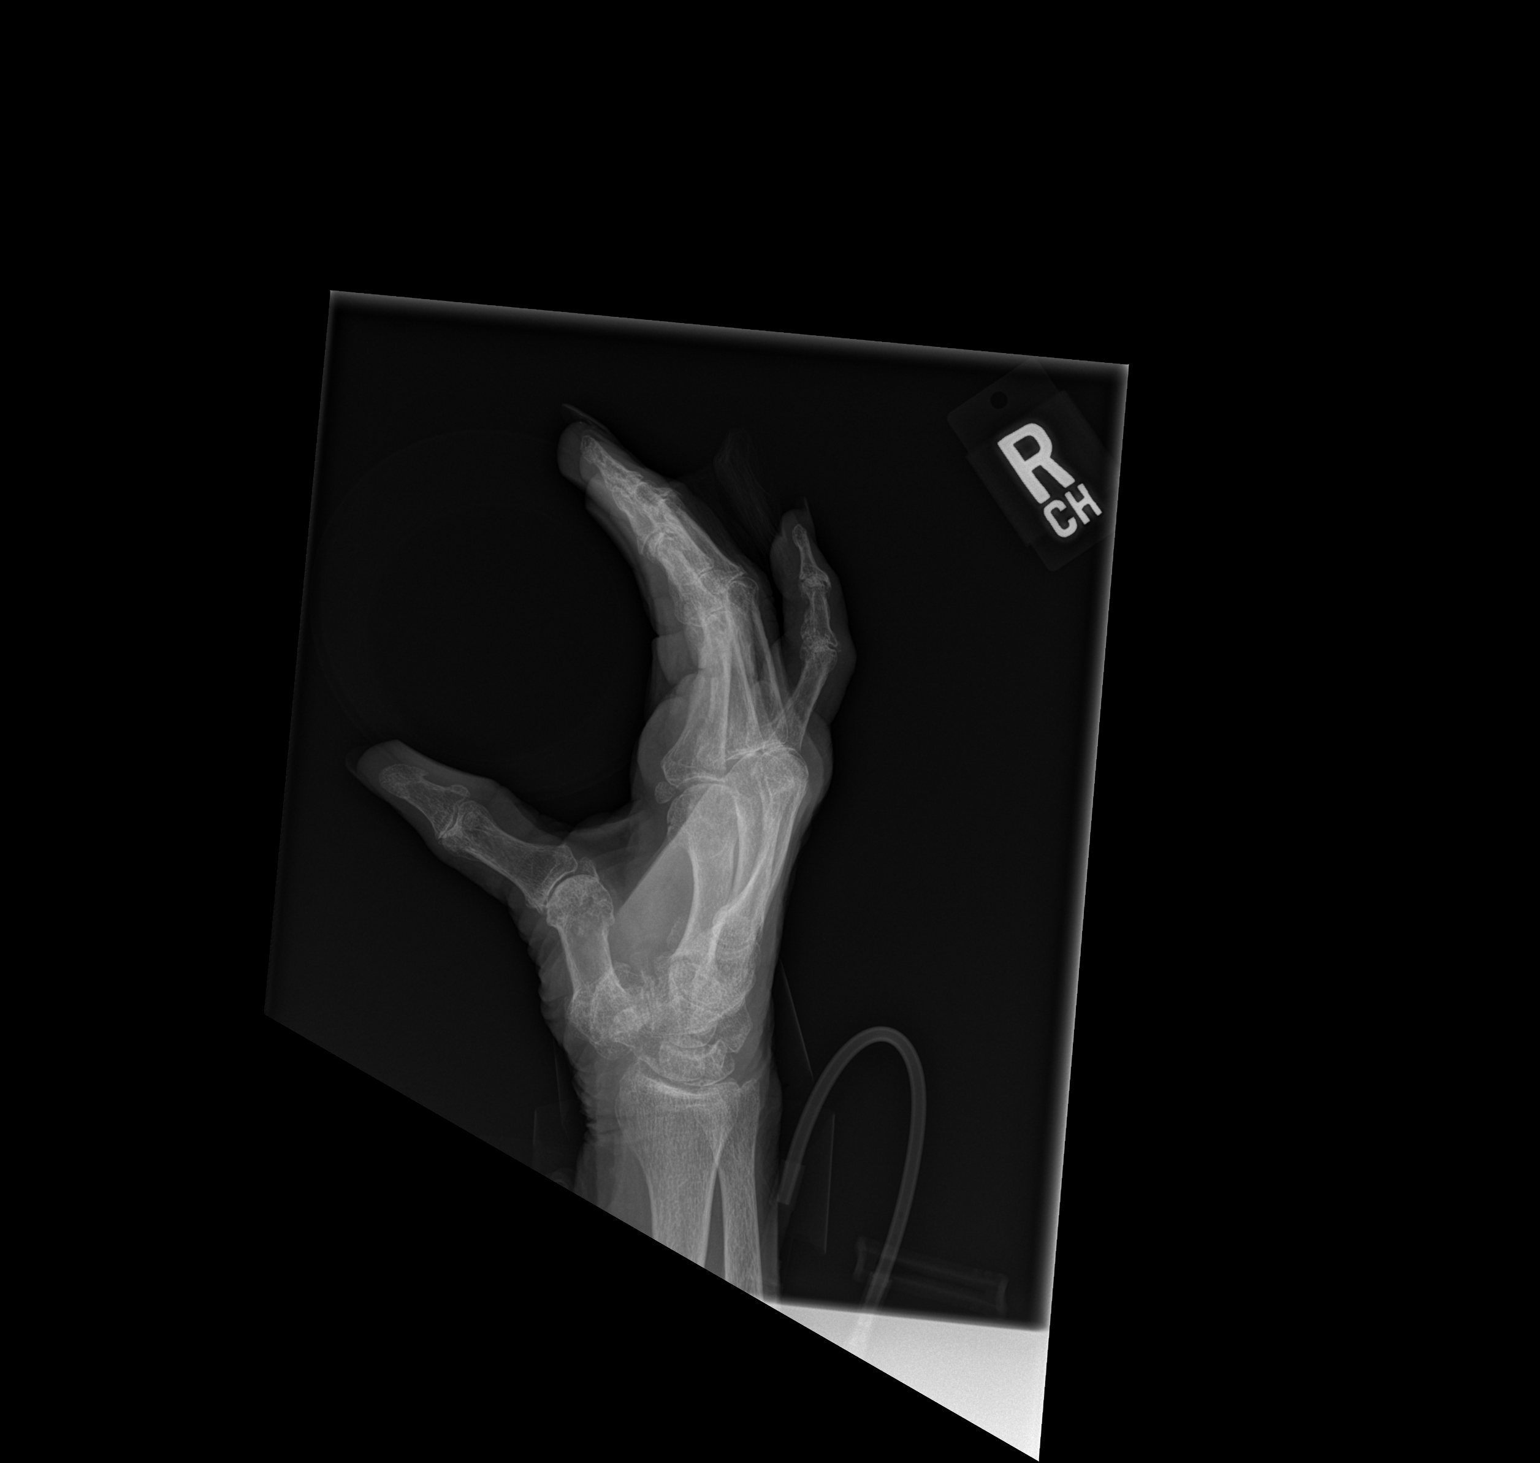

[2 of 2 positions shown; findings below may reference images not displayed]

FINDINGS: There is a mildly angulated fracture through the base of the fifth
proximal phalanx. Degenerative changes are noted in the
interphalangeal and metacarpophalangeal joints as well as the first
CMC joint. No other fractures are seen.
IMPRESSION: Fifth proximal phalangeal fracture.

Multifocal degenerative change.

## 2022-03-28 ENCOUNTER — Encounter: Payer: Self-pay | Admitting: *Deleted
